# Patient Record
Sex: Female | Born: 1955 | Race: White | Hispanic: No | Smoking: Former smoker
Health system: Southern US, Community
[De-identification: ages and names within clinical notes are randomized; demographics above are authoritative.]

## PROBLEM LIST (undated history)

## (undated) DIAGNOSIS — D649 Anemia, unspecified: Secondary | ICD-10-CM

## (undated) DIAGNOSIS — M1990 Unspecified osteoarthritis, unspecified site: Secondary | ICD-10-CM

## (undated) DIAGNOSIS — K449 Diaphragmatic hernia without obstruction or gangrene: Secondary | ICD-10-CM

## (undated) DIAGNOSIS — M069 Rheumatoid arthritis, unspecified: Secondary | ICD-10-CM

## (undated) DIAGNOSIS — K219 Gastro-esophageal reflux disease without esophagitis: Secondary | ICD-10-CM

## (undated) DIAGNOSIS — I10 Essential (primary) hypertension: Secondary | ICD-10-CM

## (undated) DIAGNOSIS — G4730 Sleep apnea, unspecified: Secondary | ICD-10-CM

## (undated) DIAGNOSIS — E559 Vitamin D deficiency, unspecified: Secondary | ICD-10-CM

## (undated) HISTORY — DX: Anemia, unspecified: D64.9

## (undated) HISTORY — DX: Unspecified osteoarthritis, unspecified site: M19.90

## (undated) HISTORY — DX: Essential (primary) hypertension: I10

## (undated) HISTORY — DX: Diaphragmatic hernia without obstruction or gangrene: K44.9

---

## 2004-08-31 ENCOUNTER — Ambulatory Visit: Payer: Self-pay | Admitting: General Surgery

## 2005-08-28 ENCOUNTER — Ambulatory Visit: Payer: Self-pay | Admitting: General Surgery

## 2005-11-27 HISTORY — PX: COLONOSCOPY: SHX174

## 2006-04-30 ENCOUNTER — Ambulatory Visit: Payer: Self-pay | Admitting: Gastroenterology

## 2006-07-19 ENCOUNTER — Ambulatory Visit: Payer: Self-pay | Admitting: Internal Medicine

## 2006-08-30 ENCOUNTER — Ambulatory Visit: Payer: Self-pay | Admitting: General Surgery

## 2007-01-29 ENCOUNTER — Ambulatory Visit: Payer: Self-pay | Admitting: Internal Medicine

## 2007-09-19 ENCOUNTER — Ambulatory Visit: Payer: Self-pay | Admitting: General Surgery

## 2008-09-21 ENCOUNTER — Ambulatory Visit: Payer: Self-pay | Admitting: Internal Medicine

## 2008-11-27 DIAGNOSIS — K859 Acute pancreatitis without necrosis or infection, unspecified: Secondary | ICD-10-CM

## 2008-11-27 HISTORY — DX: Acute pancreatitis without necrosis or infection, unspecified: K85.90

## 2008-12-18 ENCOUNTER — Inpatient Hospital Stay: Payer: Self-pay | Admitting: Internal Medicine

## 2009-09-23 ENCOUNTER — Ambulatory Visit: Payer: Self-pay | Admitting: Internal Medicine

## 2010-09-26 ENCOUNTER — Ambulatory Visit: Payer: Self-pay | Admitting: Internal Medicine

## 2011-02-07 ENCOUNTER — Ambulatory Visit: Payer: Self-pay | Admitting: Internal Medicine

## 2011-09-28 ENCOUNTER — Ambulatory Visit: Payer: Self-pay | Admitting: Internal Medicine

## 2011-11-28 HISTORY — PX: CARPAL TUNNEL RELEASE: SHX101

## 2012-06-04 ENCOUNTER — Emergency Department: Payer: Self-pay | Admitting: *Deleted

## 2012-06-07 ENCOUNTER — Encounter: Payer: Self-pay | Admitting: Internal Medicine

## 2012-06-27 ENCOUNTER — Encounter: Payer: Self-pay | Admitting: Internal Medicine

## 2012-07-08 ENCOUNTER — Ambulatory Visit: Payer: Self-pay | Admitting: Orthopedic Surgery

## 2012-07-08 DIAGNOSIS — I10 Essential (primary) hypertension: Secondary | ICD-10-CM

## 2012-07-11 ENCOUNTER — Ambulatory Visit: Payer: Self-pay | Admitting: Orthopedic Surgery

## 2012-07-28 ENCOUNTER — Encounter: Payer: Self-pay | Admitting: Internal Medicine

## 2012-09-30 ENCOUNTER — Ambulatory Visit: Payer: Self-pay | Admitting: Internal Medicine

## 2013-06-02 ENCOUNTER — Encounter: Payer: Self-pay | Admitting: Rheumatology

## 2013-06-27 ENCOUNTER — Encounter: Payer: Self-pay | Admitting: Rheumatology

## 2013-10-01 ENCOUNTER — Ambulatory Visit: Payer: Self-pay | Admitting: Internal Medicine

## 2014-01-12 ENCOUNTER — Ambulatory Visit: Payer: Self-pay | Admitting: Internal Medicine

## 2014-11-04 ENCOUNTER — Ambulatory Visit: Payer: Self-pay | Admitting: Internal Medicine

## 2015-03-16 NOTE — Op Note (Signed)
PATIENT NAME:  Janice Rowe, Janice Rowe MR#:  [id number] DATE OF BIRTH:  1956/03/02  DATE OF PROCEDURE:  07/11/2012  PREOPERATIVE DIAGNOSIS: Right carpal tunnel syndrome.   POSTOPERATIVE DIAGNOSIS: Right carpal tunnel syndrome.   PROCEDURE: Right carpal tunnel release.   SURGEON: Laurene Footman, M.D.   ANESTHESIA: General.   DESCRIPTION OF PROCEDURE: The patient was brought to the Operating Room and after adequate general anesthesia was obtained, her arm was prepped and draped in the usual sterile fashion with a tourniquet applied to the upper arm. After patient identification and time-out procedures were carried out, the tourniquet was raised to 250 mmHg. An incision was made along the ulnar crease in line with the ring metacarpal approximately 2.5 cm in length. Subcutaneous tissue was spread and hemostasis achieved with cautery. The transverse carpal ligament was identified and incised. A small hemostat was placed underneath the ligament and release was carried out first distally until there was fat noted around the nerve consistent with lack of compression and then more proximally. There is an area of significant compression about 2 cm in length in the proximal portion of the transverse carpal ligament. After release, the nerve was noted to have a good vascular blush. It did have some slight hourglass constriction in this area. Inspection of the carpal tunnel revealed significant flexor tenosynovitis and a portion of this was debrided. The wound was then closed with 4-0 nylon in a simple interrupted fashion. 10 mL of 0.5% Sensorcaine without epinephrine were infiltrated on either side of the incision. The wound was dressed with Xeroform, 4 x 4's, Webril, and Ace wrap. Tourniquet time was 12 minutes at 250 mmHg.   COMPLICATIONS: None.  SPECIMEN: None.  ESTIMATED BLOOD LOSS: Minimal.  ____________________________ Laurene Footman, MD mjm:ap D: 07/11/2012 21:36:31 ET             T: 07/12/2012 10:34:06 ET               JOB#: [id number] [name] MD ELECTRONICALLY SIGNED 07/12/2012 12:52

## 2015-09-15 DIAGNOSIS — G4733 Obstructive sleep apnea (adult) (pediatric): Secondary | ICD-10-CM | POA: Insufficient documentation

## 2016-01-04 ENCOUNTER — Other Ambulatory Visit: Payer: Self-pay | Admitting: Internal Medicine

## 2016-01-04 DIAGNOSIS — H539 Unspecified visual disturbance: Secondary | ICD-10-CM

## 2016-01-18 ENCOUNTER — Ambulatory Visit: Payer: BC Managed Care – PPO

## 2016-01-18 ENCOUNTER — Ambulatory Visit
Admission: RE | Admit: 2016-01-18 | Discharge: 2016-01-18 | Disposition: A | Discharge status: Home / Self Care | Payer: BC Managed Care – PPO | Source: Ambulatory Visit | Attending: Internal Medicine | Admitting: Internal Medicine

## 2016-01-18 DIAGNOSIS — H539 Unspecified visual disturbance: Secondary | ICD-10-CM | POA: Diagnosis not present

## 2016-01-18 DIAGNOSIS — I6523 Occlusion and stenosis of bilateral carotid arteries: Secondary | ICD-10-CM | POA: Insufficient documentation

## 2016-01-27 DIAGNOSIS — F3342 Major depressive disorder, recurrent, in full remission: Secondary | ICD-10-CM | POA: Insufficient documentation

## 2016-08-02 ENCOUNTER — Other Ambulatory Visit: Payer: Self-pay | Admitting: Internal Medicine

## 2016-08-02 DIAGNOSIS — Z1231 Encounter for screening mammogram for malignant neoplasm of breast: Secondary | ICD-10-CM

## 2016-08-02 DIAGNOSIS — G43109 Migraine with aura, not intractable, without status migrainosus: Secondary | ICD-10-CM | POA: Insufficient documentation

## 2016-08-24 ENCOUNTER — Ambulatory Visit
Admission: RE | Admit: 2016-08-24 | Discharge: 2016-08-24 | Disposition: A | Discharge status: Home / Self Care | Payer: BC Managed Care – PPO | Source: Ambulatory Visit | Attending: Internal Medicine | Admitting: Internal Medicine

## 2016-08-24 DIAGNOSIS — Z1231 Encounter for screening mammogram for malignant neoplasm of breast: Secondary | ICD-10-CM | POA: Diagnosis not present

## 2016-09-01 ENCOUNTER — Other Ambulatory Visit: Payer: Self-pay | Admitting: Gastroenterology

## 2016-09-01 DIAGNOSIS — R1310 Dysphagia, unspecified: Secondary | ICD-10-CM

## 2016-09-12 ENCOUNTER — Ambulatory Visit
Admission: RE | Admit: 2016-09-12 | Discharge: 2016-09-12 | Disposition: A | Discharge status: Home / Self Care | Payer: BC Managed Care – PPO | Source: Ambulatory Visit | Attending: Gastroenterology | Admitting: Gastroenterology

## 2016-09-12 DIAGNOSIS — K449 Diaphragmatic hernia without obstruction or gangrene: Secondary | ICD-10-CM | POA: Diagnosis not present

## 2016-09-12 DIAGNOSIS — K224 Dyskinesia of esophagus: Secondary | ICD-10-CM | POA: Insufficient documentation

## 2016-09-12 DIAGNOSIS — R1310 Dysphagia, unspecified: Secondary | ICD-10-CM | POA: Insufficient documentation

## 2016-09-12 DIAGNOSIS — K222 Esophageal obstruction: Secondary | ICD-10-CM | POA: Insufficient documentation

## 2016-09-12 DIAGNOSIS — K219 Gastro-esophageal reflux disease without esophagitis: Secondary | ICD-10-CM | POA: Insufficient documentation

## 2016-10-03 ENCOUNTER — Encounter: Payer: Self-pay | Admitting: General Surgery

## 2016-10-05 ENCOUNTER — Encounter: Payer: Self-pay | Admitting: *Deleted

## 2016-10-11 ENCOUNTER — Ambulatory Visit (INDEPENDENT_AMBULATORY_CARE_PROVIDER_SITE_OTHER): Payer: BC Managed Care – PPO | Admitting: General Surgery

## 2016-10-11 ENCOUNTER — Encounter: Payer: Self-pay | Admitting: General Surgery

## 2016-10-11 ENCOUNTER — Other Ambulatory Visit: Payer: Self-pay | Admitting: General Surgery

## 2016-10-11 VITALS — BP 128/68 | HR 70 | Resp 12 | Ht 71.0 in | Wt 211.0 lb

## 2016-10-11 DIAGNOSIS — Z1211 Encounter for screening for malignant neoplasm of colon: Secondary | ICD-10-CM

## 2016-10-11 DIAGNOSIS — R4702 Dysphasia: Secondary | ICD-10-CM

## 2016-10-11 DIAGNOSIS — R1310 Dysphagia, unspecified: Secondary | ICD-10-CM

## 2016-10-11 DIAGNOSIS — K625 Hemorrhage of anus and rectum: Secondary | ICD-10-CM

## 2016-10-11 LAB — POC HEMOCCULT BLD/STL (OFFICE/1-CARD/DIAGNOSTIC): FECAL OCCULT BLD: NEGATIVE

## 2016-10-11 NOTE — Progress Notes (Signed)
Patient ID: Janice Rowe, female   DOB: 1956-03-11, 60 y.o.   MRN: [id number]  Chief Complaint  Patient presents with  . Other    HPI Janice Rowe is a 60 y.o. female here today for trouble swallowing. Patient had a barium swallow done on 09/12/2016 which came back abnormal. She states the trouble swallowing has been going on for 6 months. Chicken, rice, red meat and salad can make her choke.  She states she has had some bleeding with bowel movements. Some discomfort during defecation, otherwise symptoms of discomfort during cleansing of the anal tissue.  I personally reviewed the patient's history.    HPI  Past Medical History:  Diagnosis Date  . Anemia   . Arthritis   . Hiatal hernia   . Hypertension     Past Surgical History:  Procedure Laterality Date  . CARPAL TUNNEL RELEASE  2013  . COLONOSCOPY  2007    Family History  Problem Relation Age of Onset  . Breast cancer Mother     Social History Social History  Substance Use Topics  . Smoking status: Never Smoker  . Smokeless tobacco: Never Used  . Alcohol use Yes    Allergies  Allergen Reactions  . Ceftriaxone Nausea Only and Rash    Current Outpatient Prescriptions  Medication Sig Dispense Refill  . B Complex Vitamins (VITAMIN-B COMPLEX) TABS Take by mouth.    . Cholecalciferol 4000 units CAPS Take by mouth.    . etanercept (ENBREL) 50 MG/ML injection INJECT 1 ML (50 MG TOTAL) SUBCUTANEOUSLY ONCE A WEEK.    . folic acid (FOLVITE) 1 MG tablet Take 1 mg by mouth daily.    . metoprolol succinate (TOPROL-XL) 50 MG 24 hr tablet     . Omega-3 Fatty Acids (FISH OIL) 1200 MG CPDR Take by mouth.    Marland Kitchen omeprazole (PRILOSEC) 20 MG capsule Take 20 mg by mouth daily.    Marland Kitchen venlafaxine XR (EFFEXOR-XR) 37.5 MG 24 hr capsule     . vitamin B-12 (CYANOCOBALAMIN) 100 MCG tablet Take 100 mcg by mouth daily.    Marland Kitchen zolpidem (AMBIEN) 5 MG tablet Take 5 mg by mouth at bedtime as needed for sleep.     No current  facility-administered medications for this visit.     Review of Systems Review of Systems  Constitutional: Negative.   HENT: Positive for trouble swallowing.   Respiratory: Positive for choking.   Cardiovascular: Negative.     Blood pressure 128/68, pulse 70, resp. rate 12, height 5\' 11"  (1.803 m), weight 211 lb (95.7 kg).  Physical Exam Physical Exam  Constitutional: She appears well-developed and well-nourished.  Eyes: Conjunctivae are normal. No scleral icterus.  Neck: Neck supple.  Cardiovascular: Normal rate and regular rhythm.   Murmur heard.  Systolic murmur is present with a grade of 1/6  Pulmonary/Chest: Effort normal and breath sounds normal.  Abdominal: Soft. Normal appearance and bowel sounds are normal.  Genitourinary: Rectal exam shows no external hemorrhoid and no internal hemorrhoid.     Lymphadenopathy:    She has no cervical adenopathy.  Neurological: She is alert.  Skin: Skin is dry.    Data Reviewed  09/01/2016 GI evaluation with Laurine Blazer, PA for Dr. Gustavo Lah reviewed.  Daily Prilosec instituted at that visit. Anoscopy showed no significant hemorrhoidal disease. No fissure or fistula noted.  Barium swallow of 09/12/2016 reviewed.  FINDINGS: The patient swallows readily. There is moderate esophageal dysmotility with tertiary contractions involving the distal half of  the esophagus.  There is a fairly small but fixed hiatal hernia with reflux of a full column of barium to the upper esophagus which is slow to empty. There is a small Schatzki ring distally with only slight narrowing at the ring. There is no other focal esophageal narrowing. There is no esophageal mass or ulceration. The pharynx appears normal.  13 mm barium tablet passes freely from the mouth to the level of the Schatzki ring. The tablet hesitate at the Schatzki ring for approximately 30 seconds but after a second bolus of water passed intact through the Schatzki ring and  hiatal hernia into the stomach.  IMPRESSION: Moderate esophageal dysmotility with liquid material. Focal hiatal hernia with a mild Schatzki ring distally. No other narrowing in the esophagus noted. There is diffuse reflux which is slow to empty. 13 mm barium tablet passed freely to the level of the Schatzki ring and pass through the Schatzki ring after a second bolus of water intact into the stomach. There is no demonstrable esophageal mass or ulceration. No mucosal lesions are demonstrated on this study.     Assessment     Dysphagia secondary to distal esophageal dysmotility and Schatzki ring.  Candidate for screening colonoscopy.     Plan    Indication for EGD and biopsy/dilatation reviewed.     Colonoscopy with possible biopsy/polypectomy prn: Information regarding the procedure, including its potential risks and complications (including but not limited to perforation of the bowel, which may require emergency surgery to repair, and bleeding) was verbally given to the patient. Educational information regarding lower intestinal endoscopy was given to the patient. Written instructions for how to complete the bowel prep using Miralax were provided. The importance of drinking ample fluids to avoid dehydration as a result of the prep emphasized.  Patient has been scheduled for an upper and lower endoscopy on 11-29-16 at [facility]. This patient reports she already has Miralax prescription.   This information has been scribed by Gaspar Cola CMA.   Robert Bellow 10/11/2016, 8:04 PM

## 2016-10-11 NOTE — Patient Instructions (Signed)
Colonoscopy, Adult A colonoscopy is an exam to look at the entire large intestine. During the exam, a lubricated, bendable tube is inserted into the anus and then passed into the rectum, colon, and other parts of the large intestine. A colonoscopy is often done as a part of normal colorectal screening or in response to certain symptoms, such as anemia, persistent diarrhea, abdominal pain, and blood in the stool. The exam can help screen for and diagnose medical problems, including:  Tumors.  Polyps.  Inflammation.  Areas of bleeding. Tell a health care provider about:  Any allergies you have.  All medicines you are taking, including vitamins, herbs, eye drops, creams, and over-the-counter medicines.  Any problems you or family members have had with anesthetic medicines.  Any blood disorders you have.  Any surgeries you have had.  Any medical conditions you have.  Any problems you have had passing stool. What are the risks? Generally, this is a safe procedure. However, problems may occur, including:  Bleeding.  A tear in the intestine.  A reaction to medicines given during the exam.  Infection (rare). What happens before the procedure? Eating and drinking restrictions  Follow instructions from your health care provider about eating and drinking, which may include:  A few days before the procedure - follow a low-fiber diet. Avoid nuts, seeds, dried fruit, raw fruits, and vegetables.  1-3 days before the procedure - follow a clear liquid diet. Drink only clear liquids, such as clear broth or bouillon, black coffee or tea, clear juice, clear soft drinks or sports drinks, gelatin desert, and popsicles. Avoid any liquids that contain red or purple dye.  On the day of the procedure - do not eat or drink anything during the 2 hours before the procedure, or within the time period that your health care provider recommends. Bowel prep  If you were prescribed an oral bowel prep to  clean out your colon:  Take it as told by your health care provider. Starting the day before your procedure, you will need to drink a large amount of medicated liquid. The liquid will cause you to have multiple loose stools until your stool is almost clear or light green.  If your skin or anus gets irritated from diarrhea, you may use these to relieve the irritation:  Medicated wipes, such as adult wet wipes with aloe and vitamin E.  A skin soothing-product like petroleum jelly.  If you vomit while drinking the bowel prep, take a break for up to 60 minutes and then begin the bowel prep again. If vomiting continues and you cannot take the bowel prep without vomiting, call your health care provider. General instructions  Ask your health care provider about changing or stopping your regular medicines. This is especially important if you are taking diabetes medicines or blood thinners.  Plan to have someone take you home from the hospital or clinic. What happens during the procedure?  An IV tube may be inserted into one of your veins.  You will be given medicine to help you relax (sedative).  To reduce your risk of infection:  Your health care team will wash or sanitize their hands.  Your anal area will be washed with soap.  You will be asked to lie on your side with your knees bent.  Your health care provider will lubricate a long, thin, flexible tube. The tube will have a camera and a light on the end.  The tube will be inserted into your anus.  The tube will be gently eased through your rectum and colon.  Air will be delivered into your colon to keep it open. You may feel some pressure or cramping.  The camera will be used to take images during the procedure.  A small tissue sample may be removed from your body to be examined under a microscope (biopsy). If any potential problems are found, the tissue will be sent to a lab for testing.  If small polyps are found, your health  care provider may remove them and have them checked for cancer cells.  The tube that was inserted into your anus will be slowly removed. The procedure may vary among health care providers and hospitals. What happens after the procedure?  Your blood pressure, heart rate, breathing rate, and blood oxygen level will be monitored until the medicines you were given have worn off.  Do not drive for 24 hours after the exam.  You may have a small amount of blood in your stool.  You may pass gas and have mild abdominal cramping or bloating due to the air that was used to inflate your colon during the exam.  It is up to you to get the results of your procedure. Ask your health care provider, or the department performing the procedure, when your results will be ready. This information is not intended to replace advice given to you by your health care provider. Make sure you discuss any questions you have with your health care provider. Document Released: 11/10/2000 Document Revised: 06/02/2016 Document Reviewed: 01/25/2016 Elsevier Interactive Patient Education  2017 [employer]  

## 2016-11-08 DIAGNOSIS — R011 Cardiac murmur, unspecified: Secondary | ICD-10-CM | POA: Insufficient documentation

## 2016-11-22 ENCOUNTER — Telehealth: Payer: Self-pay | Admitting: *Deleted

## 2016-11-22 NOTE — Telephone Encounter (Signed)
Patient was contacted today and medication list was reviewed. She states she is no longer on Enbrel and she will begin taking Humira in it's place but has not started on this yet. Also, states that she takes naproxen once a day but unsure of dosage and methotrexate once a week. Medication list was updated accordingly.   We will proceed with upper and lower endoscopy as scheduled for 11-29-16 at [facility].  This patient was instructed to call the office should she have further questions.

## 2016-11-28 ENCOUNTER — Encounter: Payer: Self-pay | Admitting: *Deleted

## 2016-11-29 ENCOUNTER — Ambulatory Visit: Payer: BC Managed Care – PPO | Admitting: Anesthesiology

## 2016-11-29 ENCOUNTER — Ambulatory Visit
Admission: RE | Admit: 2016-11-29 | Discharge: 2016-11-29 | Disposition: A | Discharge status: Home / Self Care | Payer: BC Managed Care – PPO | Source: Ambulatory Visit | Attending: General Surgery | Admitting: General Surgery

## 2016-11-29 ENCOUNTER — Encounter
Admission: RE | Disposition: A | Discharge status: Home / Self Care | Payer: Self-pay | Source: Ambulatory Visit | Attending: General Surgery

## 2016-11-29 DIAGNOSIS — R1310 Dysphagia, unspecified: Secondary | ICD-10-CM | POA: Diagnosis not present

## 2016-11-29 DIAGNOSIS — K222 Esophageal obstruction: Secondary | ICD-10-CM | POA: Diagnosis not present

## 2016-11-29 DIAGNOSIS — I10 Essential (primary) hypertension: Secondary | ICD-10-CM | POA: Diagnosis not present

## 2016-11-29 DIAGNOSIS — K449 Diaphragmatic hernia without obstruction or gangrene: Secondary | ICD-10-CM | POA: Diagnosis not present

## 2016-11-29 DIAGNOSIS — Z1211 Encounter for screening for malignant neoplasm of colon: Secondary | ICD-10-CM | POA: Diagnosis present

## 2016-11-29 DIAGNOSIS — R4702 Dysphasia: Secondary | ICD-10-CM

## 2016-11-29 DIAGNOSIS — D649 Anemia, unspecified: Secondary | ICD-10-CM | POA: Insufficient documentation

## 2016-11-29 DIAGNOSIS — G4730 Sleep apnea, unspecified: Secondary | ICD-10-CM | POA: Insufficient documentation

## 2016-11-29 DIAGNOSIS — K317 Polyp of stomach and duodenum: Secondary | ICD-10-CM | POA: Insufficient documentation

## 2016-11-29 HISTORY — PX: ESOPHAGOGASTRODUODENOSCOPY (EGD) WITH PROPOFOL: SHX5813

## 2016-11-29 HISTORY — DX: Sleep apnea, unspecified: G47.30

## 2016-11-29 HISTORY — PX: COLONOSCOPY WITH PROPOFOL: SHX5780

## 2016-11-29 SURGERY — COLONOSCOPY WITH PROPOFOL
Anesthesia: General

## 2016-11-29 MED ORDER — PROPOFOL 500 MG/50ML IV EMUL
INTRAVENOUS | Status: AC
Start: 2016-11-29 — End: 2016-11-29
  Filled 2016-11-29: qty 50

## 2016-11-29 MED ORDER — SODIUM CHLORIDE 0.9 % IV SOLN
INTRAVENOUS | Status: DC
  Administered 2016-11-29: 07:00:00 via INTRAVENOUS

## 2016-11-29 MED ORDER — PROPOFOL 10 MG/ML IV BOLUS
INTRAVENOUS | Status: DC | PRN
  Administered 2016-11-29: 80 mg via INTRAVENOUS

## 2016-11-29 MED ORDER — FENTANYL CITRATE (PF) 100 MCG/2ML IJ SOLN
INTRAMUSCULAR | Status: DC | PRN
  Administered 2016-11-29: 50 ug via INTRAVENOUS

## 2016-11-29 MED ORDER — PROPOFOL 500 MG/50ML IV EMUL
INTRAVENOUS | Status: DC | PRN
  Administered 2016-11-29: 100 ug/kg/min via INTRAVENOUS

## 2016-11-29 MED ORDER — FENTANYL CITRATE (PF) 100 MCG/2ML IJ SOLN
INTRAMUSCULAR | Status: AC
  Filled 2016-11-29: qty 2

## 2016-11-29 NOTE — Anesthesia Preprocedure Evaluation (Signed)
Anesthesia Evaluation  Patient identified by MRN, date of birth, ID band Patient awake    Reviewed: Allergy & Precautions, H&P , NPO status , Patient's Chart, lab work & pertinent test results  History of Anesthesia Complications Negative for: history of anesthetic complications  Airway Mallampati: III  TM Distance: <3 FB Neck ROM: limited    Dental  (+) Poor Dentition, Chipped   Pulmonary neg shortness of breath, sleep apnea and Continuous Positive Airway Pressure Ventilation ,    Pulmonary exam normal breath sounds clear to auscultation       Cardiovascular Exercise Tolerance: Good hypertension, (-) angina(-) Past MI and (-) DOE Normal cardiovascular exam Rhythm:regular Rate:Normal     Neuro/Psych  Neuromuscular disease negative psych ROS   GI/Hepatic Neg liver ROS, hiatal hernia, GERD  Controlled,  Endo/Other  negative endocrine ROS  Renal/GU negative Renal ROS  negative genitourinary   Musculoskeletal  (+) Arthritis ,   Abdominal   Peds  Hematology negative hematology ROS (+)   Anesthesia Other Findings Past Medical History: No date: Anemia No date: Arthritis No date: Hiatal hernia No date: Hypertension No date: Sleep apnea  Past Surgical History: 2013: CARPAL TUNNEL RELEASE 2007: COLONOSCOPY  BMI    Body Mass Index:  28.45 kg/m      Reproductive/Obstetrics negative OB ROS                             Anesthesia Physical Anesthesia Plan  ASA: III  Anesthesia Plan: General   Post-op Pain Management:    Induction:   Airway Management Planned:   Additional Equipment:   Intra-op Plan:   Post-operative Plan:   Informed Consent: I have reviewed the patients History and Physical, chart, labs and discussed the procedure including the risks, benefits and alternatives for the proposed anesthesia with the patient or authorized representative who has indicated his/her  understanding and acceptance.   Dental Advisory Given  Plan Discussed with: Anesthesiologist, CRNA and Surgeon  Anesthesia Plan Comments:         Anesthesia Quick Evaluation

## 2016-11-29 NOTE — Op Note (Signed)
[facility] Patient Name: Janice Rowe Procedure Date: 11/29/2016 7:32 AM MRN: [id number] Account #: [id number] Date of Birth: 1956/09/27 Admit Type: Outpatient Age: 61 Room: [facility] ENDO ROOM 1 Gender: Female Note Status: Finalized Procedure:            Upper GI endoscopy Indications:          Dysphagia Providers:            Robert Bellow, MD Referring MD:         Ramonita Lab, MD (Referring MD) Medicines:            Monitored Anesthesia Care Complications:        No immediate complications. Procedure:            Pre-Anesthesia Assessment:                       - Prior to the procedure, a History and Physical was                        performed, and patient medications, allergies and                        sensitivities were reviewed. The patient's tolerance of                        previous anesthesia was reviewed.                       - The risks and benefits of the procedure and the                        sedation options and risks were discussed with the                        patient. All questions were answered and informed                        consent was obtained.                       After obtaining informed consent, the endoscope was                        passed under direct vision. Throughout the procedure,                        the patient's blood pressure, pulse, and oxygen                        saturations were monitored continuously. The Endoscope                        was introduced through the mouth, and advanced to the                        second part of duodenum. The upper GI endoscopy was                        accomplished without difficulty. The patient tolerated  the procedure well. Findings:      A mild Schatzki ring (acquired) was found at the gastroesophageal       junction. A TTS dilator was passed through the scope. Dilation with a       15-16.5-18 mm balloon dilator was performed to  18 mm.      A few 4 mm sessile polyps with no bleeding and no stigmata of recent       bleeding were found in the gastric body.      The examined duodenum was normal. Impression:           - Mild Schatzki ring. Dilated.                       - A few gastric polyps.                       - Normal examined duodenum.                       - No specimens collected. Recommendation:       - Perform a colonoscopy today. Procedure Code(s):    --- Professional ---                       [phone], Esophagogastroduodenoscopy, flexible, transoral;                        with transendoscopic balloon dilation of esophagus                        (less than 30 mm diameter) Diagnosis Code(s):    --- Professional ---                       K22.2, Esophageal obstruction                       K31.7, Polyp of stomach and duodenum                       R13.10, Dysphagia, unspecified CPT copyright 2016 American Medical Association. All rights reserved. The codes documented in this report are preliminary and upon coder review may  be revised to meet current compliance requirements. Robert Bellow, MD 11/29/2016 7:54:29 AM This report has been signed electronically. Number of Addenda: 0 Note Initiated On: 11/29/2016 7:32 AM      [facility]

## 2016-11-29 NOTE — Op Note (Signed)
[facility]astroenter[facility] Patient Name: Janice Rowe Procedure Date: 11/29/2016 7:32 AM MRN: [id number] Account #: [id number] Date of Birth: 1956-09-13 Admit Type: Outpatient Age: 61 Room: [facility] ENDO ROOM 1 Gender: Female Note Status: Finalized Procedure:            Colonoscopy Indications:          Screening for colorectal malignant neoplasm Providers:            Robert Bellow, MD Referring MD:         Ramonita Lab, MD (Referring MD) Medicines:            Monitored Anesthesia Care Complications:        No immediate complications. Procedure:            Pre-Anesthesia Assessment:                       - Prior to the procedure, a History and Physical was                        performed, and patient medications, allergies and                        sensitivities were reviewed. The patient's tolerance of                        previous anesthesia was reviewed.                       - The risks and benefits of the procedure and the                        sedation options and risks were discussed with the                        patient. All questions were answered and informed                        consent was obtained.                       After obtaining informed consent, the colonoscope was                        passed under direct vision. Throughout the procedure,                        the patient's blood pressure, pulse, and oxygen                        saturations were monitored continuously. The                        Colonoscope was introduced through the anus and                        advanced to the the cecum, identified by appendiceal                        orifice and ileocecal valve. The colonoscopy was  somewhat difficult due to significant looping.                        Successful completion of the procedure was aided by                        using manual pressure. The patient tolerated the   procedure well. The quality of the bowel preparation                        was good. Findings:      The entire examined colon appeared normal on direct and retroflexion       views. Impression:           - The entire examined colon is normal on direct and                        retroflexion views.                       - No specimens collected. Recommendation:       - Discharge patient to home (via wheelchair).                       - Repeat colonoscopy in 10 years for screening purposes. Procedure Code(s):    --- Professional ---                       [phone], Colonoscopy, flexible; diagnostic, including                        collection of specimen(s) by brushing or washing, when                        performed (separate procedure) Diagnosis Code(s):    --- Professional ---                       Z12.11, Encounter for screening for malignant neoplasm                        of colon CPT copyright 2016 American Medical Association. All rights reserved. The codes documented in this report are preliminary and upon coder review may  be revised to meet current compliance requirements. Robert Bellow, MD 11/29/2016 8:14:19 AM This report has been signed electronically. Number of Addenda: 0 Note Initiated On: 11/29/2016 7:32 AM Scope Withdrawal Time: 0 hours 6 minutes 8 seconds  Total Procedure Duration: 0 hours 16 minutes 28 seconds       [facility]

## 2016-11-29 NOTE — H&P (Signed)
Janice Rowe [id number] 07/04/1956     HPI: Candidate for screening colonoscopy and upper endoscpy/ dilatation. N/V with prep, resolved.    Prescriptions Prior to Admission  Medication Sig Dispense Refill Last Dose  . B Complex Vitamins (VITAMIN-B COMPLEX) TABS Take by mouth.   11/28/2016 at Unknown time  . Cholecalciferol 4000 units CAPS Take by mouth.   11/28/2016 at Unknown time  . folic acid (FOLVITE) 1 MG tablet Take 1 mg by mouth daily.   11/28/2016 at Unknown time  . methotrexate (RHEUMATREX) 2.5 MG tablet Take 2.5 mg by mouth once a week. Caution:Chemotherapy. Protect from light.   11/22/2016 at Unknown time  . Omega-3 Fatty Acids (FISH OIL) 1200 MG CPDR Take by mouth.   11/21/2016 at Unknown time  . omeprazole (PRILOSEC) 20 MG capsule Take 20 mg by mouth daily.   11/28/2016 at Unknown time  . vitamin B-12 (CYANOCOBALAMIN) 100 MCG tablet Take 100 mcg by mouth daily.   11/28/2016 at Unknown time  . zolpidem (AMBIEN) 5 MG tablet Take 5 mg by mouth at bedtime as needed for sleep.   11/28/2016 at Unknown time  . metoprolol succinate (TOPROL-XL) 50 MG 24 hr tablet    Taking  . NAPROXEN PO Take by mouth daily.     Marland Kitchen venlafaxine XR (EFFEXOR-XR) 37.5 MG 24 hr capsule    Taking   Allergies  Allergen Reactions  . Ceftriaxone Nausea Only and Rash   Past Medical History:  Diagnosis Date  . Anemia   . Arthritis   . Hiatal hernia   . Hypertension   . Sleep apnea    Past Surgical History:  Procedure Laterality Date  . CARPAL TUNNEL RELEASE  2013  . COLONOSCOPY  2007   Social History   Social History  . Marital status: Married    Spouse name: N/A  . Number of children: N/A  . Years of education: N/A   Occupational History  . Not on file.   Social History Main Topics  . Smoking status: Never Smoker  . Smokeless tobacco: Never Used  . Alcohol use Yes  . Drug use: No  . Sexual activity: Not on file   Other Topics Concern  . Not on file   Social History Narrative  . No narrative  on file   Social History   Social History Narrative  . No narrative on file     ROS: Negative.     PE: HEENT: Negative. Lungs: Clear. Cardio: RR.  Assessment/Plan:  Proceed with planned endoscopy.   Robert Bellow 11/29/2016

## 2016-11-29 NOTE — Anesthesia Postprocedure Evaluation (Signed)
Anesthesia Post Note  Patient: MACAILA DILORENZO  Procedure(s) Performed: Procedure(s) (LRB): COLONOSCOPY WITH PROPOFOL (N/A) ESOPHAGOGASTRODUODENOSCOPY (EGD) WITH PROPOFOL (N/A)  Patient location during evaluation: Endoscopy Anesthesia Type: General Level of consciousness: awake and alert Pain management: pain level controlled Vital Signs Assessment: post-procedure vital signs reviewed and stable Respiratory status: spontaneous breathing, nonlabored ventilation, respiratory function stable and patient connected to nasal cannula oxygen Cardiovascular status: blood pressure returned to baseline and stable Postop Assessment: no signs of nausea or vomiting Anesthetic complications: no     Last Vitals:  Vitals:   11/29/16 0817 11/29/16 0819  BP: 101/64 101/64  Pulse: 72 71  Resp: 13 18  Temp: 36.7 C     Last Pain:  Vitals:   11/29/16 0817  TempSrc: Axillary                 Precious Haws Piscitello

## 2016-11-29 NOTE — Transfer of Care (Signed)
Immediate Anesthesia Transfer of Care Note  Patient: Janice Rowe  Procedure(s) Performed: Procedure(s): COLONOSCOPY WITH PROPOFOL (N/A) ESOPHAGOGASTRODUODENOSCOPY (EGD) WITH PROPOFOL (N/A)  Patient Location: PACU and Endoscopy Unit  Anesthesia Type:General  Level of Consciousness: awake, alert  and oriented  Airway & Oxygen Therapy: Patient Spontanous Breathing and Patient connected to nasal cannula oxygen  Post-op Assessment: Report given to RN and Post -op Vital signs reviewed and stable  Post vital signs: Reviewed and stable  Last Vitals:  Vitals:   11/29/16 0817 11/29/16 0819  BP: 101/64 101/64  Pulse: 72 71  Resp: 13 18  Temp: 36.7 C     Last Pain:  Vitals:   11/29/16 0817  TempSrc: Axillary         Complications: No apparent anesthesia complications

## 2016-11-30 ENCOUNTER — Encounter: Payer: Self-pay | Admitting: General Surgery

## 2017-02-06 DIAGNOSIS — R311 Benign essential microscopic hematuria: Secondary | ICD-10-CM | POA: Insufficient documentation

## 2017-02-08 ENCOUNTER — Ambulatory Visit: Payer: BC Managed Care – PPO | Attending: Rheumatology | Admitting: Occupational Therapy

## 2017-02-08 ENCOUNTER — Encounter: Payer: Self-pay | Admitting: Occupational Therapy

## 2017-02-08 DIAGNOSIS — M25641 Stiffness of right hand, not elsewhere classified: Secondary | ICD-10-CM | POA: Diagnosis present

## 2017-02-08 DIAGNOSIS — M79641 Pain in right hand: Secondary | ICD-10-CM | POA: Diagnosis not present

## 2017-02-08 NOTE — Patient Instructions (Addendum)
Pt was ed on cont with tendon glides ,opposition  And add Radial deviation of digits  Contrast or paraffin to do - what ever one is needed  Reviewed joint protection - about building up handles or grips  And gripping handles or tools with open fingers or with 90 degrees grip to  Limit ulnar deviation  Order anti ulnar deviation splint for pt

## 2017-02-08 NOTE — Therapy (Signed)
[facility] [address] [place], [address] Phone: [phone]   Fax:  [phone]  Occupational Therapy Evaluation  Patient Details  Name: Janice Rowe MRN: [id number] Date of Birth: [birth year] Referring Provider: Jefm Bryant  Encounter Date: 02/08/2017      OT End of Session - 02/08/17 1750    Visit Number 1   Number of Visits 3   Date for OT Re-Evaluation 03/01/17   OT Start Time 4818   OT Stop Time 1610   OT Time Calculation (min) 55 min   Activity Tolerance Patient tolerated treatment well   Behavior During Therapy [facility] for tasks assessed/performed      Past Medical History:  Diagnosis Date  . Anemia   . Arthritis   . Hiatal hernia   . Hypertension   . Sleep apnea     Past Surgical History:  Procedure Laterality Date  . CARPAL TUNNEL RELEASE  2013  . COLONOSCOPY  2007  . COLONOSCOPY WITH PROPOFOL N/A 11/29/2016   Procedure: COLONOSCOPY WITH PROPOFOL;  Surgeon: Robert Bellow, MD;  Location: [facility] ENDOSCOPY;  Service: Endoscopy;  Laterality: N/A;  . ESOPHAGOGASTRODUODENOSCOPY (EGD) WITH PROPOFOL N/A 11/29/2016   Procedure: ESOPHAGOGASTRODUODENOSCOPY (EGD) WITH PROPOFOL;  Surgeon: Robert Bellow, MD;  Location: [facility] ENDOSCOPY;  Service: Endoscopy;  Laterality: N/A;    There were no vitals filed for this visit.      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, Copy arrangement , teach bible study      Strength   Right Hand Grip (lbs) 42   Right Hand Lateral Pinch 10 lbs   Right Hand 3 Point Pinch 7 lbs   Left Hand Grip (lbs) 36   Left Hand Lateral Pinch 9 lbs   Left Hand 3 Point Pinch 8 lbs     Right Hand AROM   R Index  MCP 0-90 90 Degrees   R Index PIP 0-100 90 Degrees   R Long  MCP 0-90 90 Degrees   R Long PIP 0-100 75 Degrees   R Ring  MCP 0-90 90 Degrees   R Ring PIP 0-100 100 Degrees   R Little  MCP 0-90 90 Degrees   R Little PIP 0-100 100 Degrees       Pt was ed on cont with tendon glides ,opposition  And add Radial deviation of digits  Contrast  or paraffin to do - what ever one is needed  Reviewed joint protection - about building up handles or grips  And gripping handles or tools with open fingers or with 90 degrees grip to  Limit ulnar deviation  Order anti ulnar deviation splint for pt                     OT Education - 02/08/17 1750    Education provided Yes   Education Details findings , plan for HEP , anti ulnar deviation splint ordered , and joint protection    Person(s) Educated Patient   Methods Explanation;Demonstration;Tactile cues;Verbal cues;Handout   Comprehension Verbal cues required;Returned demonstration;Verbalized understanding             OT Long Term Goals - 02/08/17 1755      OT LONG TERM GOAL #1   Title Pt to be ind in donning/doffing  and wearing of anti ulnar deviation splint    Baseline no splint - increase drifting of digits on R hand ulnarly    Time 3   Period Weeks   Status New     OT LONG TERM GOAL #2   Title Pt demo or verbalize 3 joint protection principles to decrease stress on joints in R hand    Time 3    Period Weeks   Status New               Plan - 02/08/17 1751    Clinical Impression Statement Pt present with R hand showing increase ulnar deviation drift - pt do have fusion at 3rd PIP - and with fisting or gripping showing ulnar deviation - appear that 5th extensor tendon under strain for slipping - pt had extreme pain lat week at 5th [facility] and ulnar hand - this week better - reviewed with pt again joint protection for gripping object to avoid ulnar deviation - update and add new exercise to HEP and order anti ulnar deviation splint for her to use at home    Rehab Potential Fair   OT Frequency 1x / week   OT Duration 4 weeks   OT Treatment/Interventions Self-care/ADL training;Splinting;Patient/family education   Plan fit anti ulnar deviation splint - and review joint  protection and AE    OT Home Exercise Plan see pt instruction   Consulted and Agree with Plan of Care Patient      Patient will benefit from skilled therapeutic intervention in order to improve the following deficits and impairments:  Decreased range of motion, Impaired flexibility, Pain, Impaired UE functional use, Decreased knowledge of precautions, Decreased knowledge of use of DME  Visit Diagnosis: Pain in right hand - Plan: Ot plan of care cert/re-cert  Stiffness of right hand, not elsewhere classified - Plan: Ot plan of care cert/re-cert    Problem List Patient Active Problem List   Diagnosis Date Noted  . Dysphagia 10/11/2016  . Encounter for screening colonoscopy 10/11/2016  . Rectal bleeding 10/11/2016    Rosalyn Gess OTR/L,CLT 02/08/2017, 5:59 PM  [facility] [address], [place], [address] Phone: [phone]   Fax:  [phone]  Name: Janice Rowe MRN: [id number] Date of Birth: [birth year]

## 2017-02-19 ENCOUNTER — Ambulatory Visit: Payer: BC Managed Care – PPO | Admitting: Occupational Therapy

## 2017-03-05 ENCOUNTER — Ambulatory Visit: Payer: BC Managed Care – PPO | Attending: Rheumatology | Admitting: Occupational Therapy

## 2017-03-05 DIAGNOSIS — M79641 Pain in right hand: Secondary | ICD-10-CM | POA: Diagnosis present

## 2017-03-05 DIAGNOSIS — M25641 Stiffness of right hand, not elsewhere classified: Secondary | ICD-10-CM

## 2017-03-05 NOTE — Therapy (Signed)
[facility] [address] [place], [address] Phone: [phone]   Fax:  [phone]  Occupational Therapy Treatment/discharge  Patient Details  Name: Janice Rowe MRN: [id number] Date of Birth: 08-06-1956 Referring Provider: Jefm Bryant  Encounter Date: 03/05/2017      OT End of Session - 03/05/17 1512    Visit Number 2   Number of Visits 2   Date for OT Re-Evaluation 03/05/17   OT Start Time 1010   OT Stop Time 1036   OT Time Calculation (min) 26 min   Activity Tolerance Patient tolerated treatment well   Behavior During Therapy [facility] for tasks assessed/performed      Past Medical History:  Diagnosis Date  . Anemia   . Arthritis   . Hiatal hernia   . Hypertension   . Sleep apnea     Past Surgical History:  Procedure Laterality Date  . CARPAL TUNNEL RELEASE  2013  . COLONOSCOPY  2007  . COLONOSCOPY WITH PROPOFOL N/A 11/29/2016   Procedure: COLONOSCOPY WITH PROPOFOL;  Surgeon: Robert Bellow, MD;  Location: [facility] ENDOSCOPY;  Service: Endoscopy;  Laterality: N/A;  . ESOPHAGOGASTRODUODENOSCOPY (EGD) WITH PROPOFOL N/A 11/29/2016   Procedure: ESOPHAGOGASTRODUODENOSCOPY (EGD) WITH PROPOFOL;  Surgeon: Robert Bellow, MD;  Location: [facility] ENDOSCOPY;  Service: Endoscopy;  Laterality: N/A;    There were no vitals filed for this visit.      Subjective Assessment - 03/05/17 1507    Subjective  I did my exercises like you told me - the sliding to the side is hard but gets better - the more I do it - I do try and use larger joint, avoid tight grips - build up handles - and grip objects 90 degrees with  knuckles   Patient Stated Goals I do not want my fingers to get worse or drifting more - and do not want that same pain like I had last week    Currently in Pain? No/denies            [facility] OT Assessment - 03/05/17 0001      Strength   Right Hand Grip (lbs) 42   Right Hand Lateral Pinch 12 lbs   Right  Hand 3 Point Pinch 9 lbs   Left Hand Grip (lbs) 43   Left Hand Lateral Pinch 10 lbs   Left Hand 3 Point Pinch 10 lbs       Reviewed HEP with pt - opposition  RD of digits -sliding on table - pt to add small paper under  3rd digit to increase ROM  Tendon glides 10 reps each  Pt have knowledge and implementing joint protection principles Fitted and ed pt on donning and wearing anti ulnar deviation splint  Start out with 30 min x 3 then 1 min and then 2hrs  Not longer than 6 hrs day - and pull and fasten strap gentle thru webspace  Red areas need to decrease in 30 -45 min after wearing it                      OT Education - 03/05/17 1512    Education provided Yes   Education Details HEP and joint protection principles - and ulnar deviation splint wearing    Person(s) Educated Patient   Methods Explanation;Demonstration;Tactile cues;Verbal cues;Handout   Comprehension Verbal cues required;Returned demonstration;Verbalized understanding             OT Long Term  Goals - 03/05/17 1514      OT LONG TERM GOAL #1   Title Pt to be ind in donning/doffing  and wearing of anti ulnar deviation splint    Status Achieved     OT LONG TERM GOAL #2   Title Pt demo or verbalize 3 joint protection principles to decrease stress on joints in R hand    Status Achieved               Plan - 03/05/17 1513    Clinical Impression Statement Pt made great progress in grip and prehension in L and prehension strength on R hand - pt fitted with ulnar devatiion splint to prevent further ulnar devation at [facility] of R hand - pt demo understanding nad use of joint protection principles - pt met all goals and discharge with homeprogram    OT Treatment/Interventions Self-care/ADL training;Splinting;Patient/family education   Plan discharge with homprogram   OT Home Exercise Plan see pt instruction   Consulted and Agree with Plan of Care Patient      Patient will benefit from skilled  therapeutic intervention in order to improve the following deficits and impairments:     Visit Diagnosis: Pain in right hand  Stiffness of right hand, not elsewhere classified    Problem List Patient Active Problem List   Diagnosis Date Noted  . Dysphagia 10/11/2016  . Encounter for screening colonoscopy 10/11/2016  . Rectal bleeding 10/11/2016    Rosalyn Gess OTR/L,CLT 03/05/2017, 3:20 PM  [facility] [address], [place], [address] Phone: [phone]   Fax:  [phone]  Name: Janice Rowe MRN: [id number] Date of Birth: 04/02/1956

## 2017-03-05 NOTE — Therapy (Deleted)
[facility] [address] [place], [address] Phone: [phone]   Fax:  [phone]  Occupational Therapy Treatment/discharge  Patient Details  Name: Janice Rowe MRN: [id number] Date of Birth: 08-06-1956 Referring Provider: Jefm Bryant  Encounter Date: 03/05/2017      OT End of Session - 03/05/17 1512    Visit Number 2   Number of Visits 2   Date for OT Re-Evaluation 03/05/17   OT Start Time 1010   OT Stop Time 1036   OT Time Calculation (min) 26 min   Activity Tolerance Patient tolerated treatment well   Behavior During Therapy [facility] for tasks assessed/performed      Past Medical History:  Diagnosis Date  . Anemia   . Arthritis   . Hiatal hernia   . Hypertension   . Sleep apnea     Past Surgical History:  Procedure Laterality Date  . CARPAL TUNNEL RELEASE  2013  . COLONOSCOPY  2007  . COLONOSCOPY WITH PROPOFOL N/A 11/29/2016   Procedure: COLONOSCOPY WITH PROPOFOL;  Surgeon: Robert Bellow, MD;  Location: [facility] ENDOSCOPY;  Service: Endoscopy;  Laterality: N/A;  . ESOPHAGOGASTRODUODENOSCOPY (EGD) WITH PROPOFOL N/A 11/29/2016   Procedure: ESOPHAGOGASTRODUODENOSCOPY (EGD) WITH PROPOFOL;  Surgeon: Robert Bellow, MD;  Location: [facility] ENDOSCOPY;  Service: Endoscopy;  Laterality: N/A;    There were no vitals filed for this visit.      Subjective Assessment - 03/05/17 1507    Subjective  I did my exercises like you told me - the sliding to the side is hard but gets better - the more I do it - I do try and use larger joint, avoid tight grips - build up handles - and grip objects 90 degrees with  knuckles   Patient Stated Goals I do not want my fingers to get worse or drifting more - and do not want that same pain like I had last week    Currently in Pain? No/denies            [facility] OT Assessment - 03/05/17 0001      Strength   Right Hand Grip (lbs) 42   Right Hand Lateral Pinch 12 lbs   Right  Hand 3 Point Pinch 9 lbs   Left Hand Grip (lbs) 43   Left Hand Lateral Pinch 10 lbs   Left Hand 3 Point Pinch 10 lbs       Reviewed HEP with pt - opposition  RD of digits -sliding on table - pt to add small paper under  3rd digit to increase ROM  Tendon glides 10 reps each  Pt have knowledge and implementing joint protection principles Fitted and ed pt on donning and wearing anti ulnar deviation splint  Start out with 30 min x 3 then 1 min and then 2hrs  Not longer than 6 hrs day - and pull and fasten strap gentle thru webspace  Red areas need to decrease in 30 -45 min after wearing it                      OT Education - 03/05/17 1512    Education provided Yes   Education Details HEP and joint protection principles - and ulnar deviation splint wearing    Person(s) Educated Patient   Methods Explanation;Demonstration;Tactile cues;Verbal cues;Handout   Comprehension Verbal cues required;Returned demonstration;Verbalized understanding             OT Long Term  Goals - 03/05/17 1514      OT LONG TERM GOAL #1   Title Pt to be ind in donning/doffing  and wearing of anti ulnar deviation splint    Status Achieved     OT LONG TERM GOAL #2   Title Pt demo or verbalize 3 joint protection principles to decrease stress on joints in R hand    Status Achieved               Plan - 03/05/17 1513    OT Treatment/Interventions Self-care/ADL training;Splinting;Patient/family education   Plan discharge with homprogram   OT Home Exercise Plan see pt instruction   Consulted and Agree with Plan of Care Patient      Patient will benefit from skilled therapeutic intervention in order to improve the following deficits and impairments:     Visit Diagnosis: Pain in right hand  Stiffness of right hand, not elsewhere classified    Problem List Patient Active Problem List   Diagnosis Date Noted  . Dysphagia 10/11/2016  . Encounter for screening colonoscopy  10/11/2016  . Rectal bleeding 10/11/2016    Rosalyn Gess OTR/L,CLT 03/05/2017, 3:15 PM  [facility] [address] [place], [address] Phone: [phone]   Fax:  [phone]  Name: Janice Rowe MRN: [id number] Date of Birth: May 02, 1956

## 2017-03-05 NOTE — Patient Instructions (Signed)
Cont with same HEP for ROM   Pt have knowledge and implementing joint protection principles  Fitted and ed pt on donning and wearing anti ulnar deviation splint  Start out with 30 min x 3 then 1 min and then 2hrs  Not longer than 6 hrs day - and pull and fasten strap gentle thru webspace  Red areas need to decrease in 30 -45 min after wearing it

## 2017-07-17 ENCOUNTER — Other Ambulatory Visit: Payer: Self-pay | Admitting: Internal Medicine

## 2017-07-17 DIAGNOSIS — Z1231 Encounter for screening mammogram for malignant neoplasm of breast: Secondary | ICD-10-CM

## 2017-08-15 ENCOUNTER — Other Ambulatory Visit: Payer: Self-pay | Admitting: Internal Medicine

## 2017-08-15 DIAGNOSIS — R918 Other nonspecific abnormal finding of lung field: Secondary | ICD-10-CM

## 2017-08-20 ENCOUNTER — Ambulatory Visit
Admission: RE | Admit: 2017-08-20 | Discharge: 2017-08-20 | Disposition: A | Discharge status: Home / Self Care | Payer: BC Managed Care – PPO | Source: Ambulatory Visit | Attending: Internal Medicine | Admitting: Internal Medicine

## 2017-08-20 ENCOUNTER — Ambulatory Visit
Admission: RE | Admit: 2017-08-20 | Discharge: 2017-08-20 | Disposition: A | Discharge status: Home / Self Care | Payer: BC Managed Care – PPO | Source: Ambulatory Visit | Attending: Otolaryngology | Admitting: Otolaryngology

## 2017-08-20 ENCOUNTER — Other Ambulatory Visit: Payer: Self-pay | Admitting: Otolaryngology

## 2017-08-20 DIAGNOSIS — I700 Atherosclerosis of aorta: Secondary | ICD-10-CM | POA: Insufficient documentation

## 2017-08-20 DIAGNOSIS — R221 Localized swelling, mass and lump, neck: Secondary | ICD-10-CM

## 2017-08-20 DIAGNOSIS — I2510 Atherosclerotic heart disease of native coronary artery without angina pectoris: Secondary | ICD-10-CM | POA: Diagnosis not present

## 2017-08-20 DIAGNOSIS — M4712 Other spondylosis with myelopathy, cervical region: Secondary | ICD-10-CM | POA: Diagnosis not present

## 2017-08-20 DIAGNOSIS — R918 Other nonspecific abnormal finding of lung field: Secondary | ICD-10-CM | POA: Diagnosis present

## 2017-08-20 MED ORDER — IOPAMIDOL (ISOVUE-370) INJECTION 76%
75.0000 mL | Freq: Once | INTRAVENOUS | Status: AC | PRN
Start: 2017-08-20 — End: 2017-08-20
  Administered 2017-08-20: 75 mL via INTRAVENOUS

## 2017-08-21 DIAGNOSIS — R911 Solitary pulmonary nodule: Secondary | ICD-10-CM | POA: Insufficient documentation

## 2017-08-28 ENCOUNTER — Ambulatory Visit
Admission: RE | Admit: 2017-08-28 | Discharge: 2017-08-28 | Disposition: A | Discharge status: Home / Self Care | Payer: BC Managed Care – PPO | Source: Ambulatory Visit | Attending: Internal Medicine | Admitting: Internal Medicine

## 2017-08-28 DIAGNOSIS — Z1231 Encounter for screening mammogram for malignant neoplasm of breast: Secondary | ICD-10-CM | POA: Diagnosis not present

## 2017-08-31 ENCOUNTER — Other Ambulatory Visit: Payer: Self-pay | Admitting: Otolaryngology

## 2017-08-31 DIAGNOSIS — R221 Localized swelling, mass and lump, neck: Secondary | ICD-10-CM

## 2017-09-06 ENCOUNTER — Ambulatory Visit (INDEPENDENT_AMBULATORY_CARE_PROVIDER_SITE_OTHER): Payer: BC Managed Care – PPO | Admitting: Internal Medicine

## 2017-09-06 ENCOUNTER — Encounter: Payer: Self-pay | Admitting: Internal Medicine

## 2017-09-06 DIAGNOSIS — R918 Other nonspecific abnormal finding of lung field: Secondary | ICD-10-CM | POA: Diagnosis not present

## 2017-09-06 NOTE — Patient Instructions (Signed)
--  Continue the levaquin to complete the course as prescribed by Dr. Pryor Ochoa.   --Ct chest in about 6 months and follow up for that.

## 2017-09-06 NOTE — Progress Notes (Addendum)
[facility] Consultation      Assessment and Plan:  61 year old female with persistent symptoms of bronchitis after her recent sinus infection, as well as incidentally found lung nodule. History of rheumatoid arthritis on methotrexate.  Acute bronchitis. -I suspect this was triggered by her recent episode of sinusitis, for which the patient is following up with Dr. Pryor Ochoa. Her symptoms appear to have improved over the last 1 week. -She was provided reassurance today that her symptoms were secondary to sinusitis, and appear to be resolving. We discussed that her symptoms of increased sputum production may persist for several more weeks, but should be trailing off. -She is asked to call us back should her acute bronchitis recur before her scheduled follow-up, so she can be seen early, and any further workup, such as a TB quantiferon or other testing can be performed at that time.  Lung nodule. -Incidentally found small left upper lobe nodule of less than 5 mm. -Repeat CT chest without contrast in 6 months, the patient is asked to follow up after that.  ADDENDUM 09/26/17 Rheumatoid arthritis -Reviewed outpatient records from rheumatology at [facility]. patient is currently seen in [facility] for rheumatoid arthritis, and is being resumed on Humira.  Orders Placed This Encounter  Procedures  . CT CHEST WO CONTRAST    Standing Status:   Future    Standing Expiration Date:   11/06/2018    Order Specific Question:   Preferred imaging location?    Answer:   [facility]    Order Specific Question:   Radiology Contrast Protocol - do NOT remove file path    Answer:   \\[email or web address]   Return in about 6 months (around 03/07/2018).    Date: 09/06/2017  MRN# [id number] Janice Rowe [birth year]    Janice Rowe is a 61 y.o. old female seen in consultation for chief complaint of:    Chief Complaint  Patient presents with  .  Advice Only    per Dr. Pryor Ochoa for cough:  drainage:     HPI:   She has a history of RA; she was having a very sore throat back in September, and was given amox for strep throat. She was also sweating a lot at that time, she takes methtrexate for RA. She continues to have cough and sinuses. She was sent for a CXR, which was abnormal. She went to ENT and had a CT neck which showed swollen lymph glands in her neck. She was already on levaquin, and upon recheck by Dr. Pryor Ochoa it was noted that her lymph glands were still swollen so she has been continued on levaquin for an additional course. She complains of persistent excessive sweating, she denies Tb contacts.   She has cough, particularly in the am, it is now better than it was a week ago. She has been bringing up mucus, she thinks it is now better than it was about a week ago. She takes no inhaler. She has never been diagnosed with respiratory issues.  She last smoked half ppd and stopped >30 yrs ago.  Currently she denies dyspnea.   Images personally reviewed; CT chest 08/20/17-small 5 mm nodule seen in the left upper lobe.   PMHX:   Past Medical History:  Diagnosis Date  . Anemia   . Arthritis   . Hiatal hernia   . Hypertension   . Sleep apnea    Surgical Hx:  Past Surgical History:  Procedure Laterality Date  . CARPAL TUNNEL  RELEASE  2013  . COLONOSCOPY  2007  . COLONOSCOPY WITH PROPOFOL N/A 11/29/2016   Procedure: COLONOSCOPY WITH PROPOFOL;  Surgeon: Robert Bellow, MD;  Location: [facility] ENDOSCOPY;  Service: Endoscopy;  Laterality: N/A;  . ESOPHAGOGASTRODUODENOSCOPY (EGD) WITH PROPOFOL N/A 11/29/2016   Procedure: ESOPHAGOGASTRODUODENOSCOPY (EGD) WITH PROPOFOL;  Surgeon: Robert Bellow, MD;  Location: [facility] ENDOSCOPY;  Service: Endoscopy;  Laterality: N/A;   Family Hx:  Family History  Problem Relation Age of Onset  . Breast cancer Mother 29   Social Hx:   Social History  Substance Use Topics  . Smoking status: Former  Research scientist (life sciences)  . Smokeless tobacco: Never Used  . Alcohol use Yes   Medication:    Current Outpatient Prescriptions:  .  B Complex Vitamins (VITAMIN-B COMPLEX) TABS, Take by mouth., Disp: , Rfl:  .  Cholecalciferol 4000 units CAPS, Take by mouth., Disp: , Rfl:  .  folic acid (FOLVITE) 1 MG tablet, Take 1 mg by mouth daily., Disp: , Rfl:  .  methotrexate (RHEUMATREX) 2.5 MG tablet, Take 2.5 mg by mouth once a week. Caution:Chemotherapy. Protect from light., Disp: , Rfl:  .  metoprolol succinate (TOPROL-XL) 50 MG 24 hr tablet, , Disp: , Rfl:  .  NAPROXEN PO, Take by mouth daily., Disp: , Rfl:  .  Omega-3 Fatty Acids (FISH OIL) 1200 MG CPDR, Take by mouth., Disp: , Rfl:  .  omeprazole (PRILOSEC) 20 MG capsule, Take 20 mg by mouth daily., Disp: , Rfl:  .  venlafaxine XR (EFFEXOR-XR) 75 MG 24 hr capsule, Take 75 mg by mouth daily with breakfast., Disp: , Rfl:  .  vitamin B-12 (CYANOCOBALAMIN) 100 MCG tablet, Take 100 mcg by mouth daily., Disp: , Rfl:  .  zolpidem (AMBIEN) 5 MG tablet, Take 5 mg by mouth at bedtime as needed for sleep., Disp: , Rfl:    Allergies:  Ceftriaxone  Review of Systems: Gen:  Denies  fever, sweats, chills HEENT: Denies blurred vision, double vision. bleeds, sore throat Cvc:  No dizziness, chest pain. Resp:   Denies cough or sputum production, shortness of breath Gi: Denies swallowing difficulty, stomach pain. Gu:  Denies bladder incontinence, burning urine Ext:   No Joint pain, stiffness. Skin: No skin rash,  hives  Endoc:  No polyuria, polydipsia. Psych: No depression, insomnia. Other:  All other systems were reviewed with the patient and were negative other that what is mentioned in the HPI.   Physical Examination:   VS: BP (!) 150/100 (BP Location: Left Arm, Cuff Size: Normal)   Pulse 87   Ht 5\' 11"  (1.803 m)   Wt 211 lb (95.7 kg)   SpO2 95%   BMI 29.43 kg/m   General Appearance: No distress  Neuro:without focal findings,  speech normal,  HEENT: PERRLA,  EOM intact.   Pulmonary: normal breath sounds, No wheezing.  CardiovascularNormal S1,S2.  No m/r/g.   Abdomen: Benign, Soft, non-tender. Renal:  No costovertebral tenderness  GU:  No performed at this time. Endoc: No evident thyromegaly, no signs of acromegaly. Skin:   warm, no rashes, no ecchymosis  Extremities: normal, no cyanosis, clubbing.  Other findings:    LABORATORY PANEL:   CBC No results for input(s): WBC, HGB, HCT, PLT in the last 168 hours. ------------------------------------------------------------------------------------------------------------------  Chemistries  No results for input(s): NA, K, CL, CO2, GLUCOSE, BUN, CREATININE, CALCIUM, MG, AST, ALT, ALKPHOS, BILITOT in the last 168 hours.  Invalid input(s): GFRCGP ------------------------------------------------------------------------------------------------------------------  Cardiac Enzymes No results for input(s): TROPONINI in  the last 168 hours. ------------------------------------------------------------  RADIOLOGY:  No results found.     Thank  you for the consultation and for allowing [facility] to assist in the care of your patient. Our recommendations are noted above.  Please contact us if we can be of further service.   Marda Stalker, MD.  Board Certified in Internal Medicine, Pulmonary Medicine, [place], and Sleep Medicine.  [facility] Office Number: [phone]  Patricia Pesa, M.D.  Merton Border, M.D  09/06/2017

## 2017-09-17 ENCOUNTER — Ambulatory Visit
Admission: RE | Admit: 2017-09-17 | Discharge: 2017-09-17 | Disposition: A | Discharge status: Home / Self Care | Payer: BC Managed Care – PPO | Source: Ambulatory Visit | Attending: Otolaryngology | Admitting: Otolaryngology

## 2017-09-17 DIAGNOSIS — R221 Localized swelling, mass and lump, neck: Secondary | ICD-10-CM | POA: Diagnosis present

## 2018-02-27 ENCOUNTER — Ambulatory Visit
Admission: RE | Admit: 2018-02-27 | Discharge: 2018-02-27 | Disposition: A | Discharge status: Home / Self Care | Payer: BC Managed Care – PPO | Source: Ambulatory Visit | Attending: Internal Medicine | Admitting: Internal Medicine

## 2018-02-27 DIAGNOSIS — R918 Other nonspecific abnormal finding of lung field: Secondary | ICD-10-CM | POA: Diagnosis not present

## 2018-03-06 ENCOUNTER — Ambulatory Visit: Payer: BC Managed Care – PPO | Admitting: Internal Medicine

## 2018-03-18 NOTE — Progress Notes (Signed)
[facility] Consultation      Assessment and Plan:  62 year old female with persistent symptoms of bronchitis after her recent sinus infection, as well as incidentally found lung nodule. History of rheumatoid arthritis on methotrexate.  Chronic bronchitis. -Symptoms of excess mucus production. Discussed that her symptoms can be treated as needed with over the counter mucinex.  --If symptoms progress or are not controlled we could start an anticholinergic inhaler.   Lung nodule. -Incidentally found small left upper lobe nodule of less than 5 mm. -Repeat CT chest in 1 years, follow up after that.   Rheumatoid arthritis -Reviewed outpatient records from rheumatology at [facility]. patient is currently seen in [facility] for rheumatoid arthritis, and is being resumed on Humira.  Orders Placed This Encounter  Procedures  . CT CHEST WO CONTRAST    Standing Status:   Future    Standing Expiration Date:   05/20/2019    Order Specific Question:   Preferred imaging location?    Answer:   [facility]    Order Specific Question:   Radiology Contrast Protocol - do NOT remove file path    Answer:   \\[email or web address]   Return in about 1 year (around 03/20/2019) for after CT chest. .    Date: 03/18/2018  MRN# [id number] Janice Rowe August 24, 1956    Janice Rowe is a 62 y.o. old female seen in consultation for chief complaint of:    Chief Complaint  Patient presents with  . Lung Lesion    f/u  . Cough    daily non productive    HPI:   She has a history of RA; at last visit she was noted to be recovering from a episode of acute bronchitis.  She was also noted to have a lung nodule and was asked to repeated CT of the chest in 6 months.  She continues to have to clear her throat with occasional cough. She thinks she has gerd, but her mother passed away about 2 weeks ago after being in hospice so she is still recovering from  that. She has trouble laying down within a few hours of eating.   Images personally reviewed; repeat CT of the chest on 02/27/18 in comparison with previous CT chest 08/20/17- continued small 5 mm nodule seen in the left upper lobe, no change noted, early emphysematous changes.  Medication:    Current Outpatient Medications:  .  B Complex Vitamins (VITAMIN-B COMPLEX) TABS, Take by mouth., Disp: , Rfl:  .  Cholecalciferol 4000 units CAPS, Take by mouth., Disp: , Rfl:  .  folic acid (FOLVITE) 1 MG tablet, Take 1 mg by mouth daily., Disp: , Rfl:  .  methotrexate (RHEUMATREX) 2.5 MG tablet, Take 2.5 mg by mouth once a week. Caution:Chemotherapy. Protect from light., Disp: , Rfl:  .  metoprolol succinate (TOPROL-XL) 50 MG 24 hr tablet, , Disp: , Rfl:  .  NAPROXEN PO, Take by mouth daily., Disp: , Rfl:  .  Omega-3 Fatty Acids (FISH OIL) 1200 MG CPDR, Take by mouth., Disp: , Rfl:  .  omeprazole (PRILOSEC) 20 MG capsule, Take 20 mg by mouth daily., Disp: , Rfl:  .  venlafaxine XR (EFFEXOR-XR) 75 MG 24 hr capsule, Take 75 mg by mouth daily with breakfast., Disp: , Rfl:  .  vitamin B-12 (CYANOCOBALAMIN) 100 MCG tablet, Take 100 mcg by mouth daily., Disp: , Rfl:  .  zolpidem (AMBIEN) 5 MG tablet, Take 5 mg by mouth at bedtime  as needed for sleep., Disp: , Rfl:    Allergies:  Ceftriaxone  Review of Systems: Gen:  Denies  fever, sweats, chills HEENT: Denies blurred vision, double vision. bleeds, sore throat Cvc:  No dizziness, chest pain. Resp:   Denies cough or sputum production, shortness of breath Gi: Denies swallowing difficulty, stomach pain. Gu:  Denies bladder incontinence, burning urine Ext:   No Joint pain, stiffness. Skin: No skin rash,  hives  Endoc:  No polyuria, polydipsia. Psych: No depression, insomnia. Other:  All other systems were reviewed with the patient and were negative other that what is mentioned in the HPI.   Physical Examination:   VS: BP (!) 160/98 (BP Location: Left  Arm, Cuff Size: Large)   Pulse 74   Resp 16   Ht 5\' 11"  (1.803 m)   Wt 214 lb (97.1 kg)   SpO2 96%   BMI 29.85 kg/m   General Appearance: No distress  Neuro:without focal findings,  speech normal,  HEENT: PERRLA, EOM intact.   Pulmonary: normal breath sounds, No wheezing.  CardiovascularNormal S1,S2.  No m/r/g.   Abdomen: Benign, Soft, non-tender. Renal:  No costovertebral tenderness  GU:  No performed at this time. Endoc: No evident thyromegaly, no signs of acromegaly. Skin:   warm, no rashes, no ecchymosis  Extremities: normal, no cyanosis, clubbing.  Other findings:    LABORATORY PANEL:   CBC No results for input(s): WBC, HGB, HCT, PLT in the last 168 hours. ------------------------------------------------------------------------------------------------------------------  Chemistries  No results for input(s): NA, K, CL, CO2, GLUCOSE, BUN, CREATININE, CALCIUM, MG, AST, ALT, ALKPHOS, BILITOT in the last 168 hours.  Invalid input(s): GFRCGP ------------------------------------------------------------------------------------------------------------------  Cardiac Enzymes No results for input(s): TROPONINI in the last 168 hours. ------------------------------------------------------------  RADIOLOGY:  No results found.     Thank  you for the consultation and for allowing [facility] to assist in the care of your patient. Our recommendations are noted above.  Please contact us if we can be of further service.   Marda Stalker, MD.  Board Certified in Internal Medicine, Pulmonary Medicine, [place], and Sleep Medicine.  [facility] Office Number: [phone]  Patricia Pesa, M.D.  Merton Border, M.D  03/18/2018

## 2018-03-19 ENCOUNTER — Ambulatory Visit: Payer: BC Managed Care – PPO | Admitting: Internal Medicine

## 2018-03-19 ENCOUNTER — Encounter: Payer: Self-pay | Admitting: Internal Medicine

## 2018-03-19 VITALS — BP 160/98 | HR 74 | Resp 16 | Ht 71.0 in | Wt 214.0 lb

## 2018-03-19 DIAGNOSIS — R918 Other nonspecific abnormal finding of lung field: Secondary | ICD-10-CM

## 2018-03-19 DIAGNOSIS — R911 Solitary pulmonary nodule: Secondary | ICD-10-CM | POA: Diagnosis not present

## 2018-03-19 DIAGNOSIS — J42 Unspecified chronic bronchitis: Secondary | ICD-10-CM

## 2018-03-19 NOTE — Patient Instructions (Addendum)
You have symptoms of chronic bronchitis, this can be treated with mucinex over the counter. If becomes more problematic, let us know and we can start you on an inhaler.  Mucinex tablet 600 mg twice daily or 1200 mg once daily.   Will repeat CT chest in 1 year and see you back at that time.

## 2018-03-25 ENCOUNTER — Other Ambulatory Visit: Payer: Self-pay

## 2018-03-25 ENCOUNTER — Emergency Department
Admission: EM | Admit: 2018-03-25 | Discharge: 2018-03-25 | Disposition: A | Discharge status: Home / Self Care | Payer: BC Managed Care – PPO | Attending: Emergency Medicine | Admitting: Emergency Medicine

## 2018-03-25 DIAGNOSIS — Y999 Unspecified external cause status: Secondary | ICD-10-CM | POA: Insufficient documentation

## 2018-03-25 DIAGNOSIS — Z79899 Other long term (current) drug therapy: Secondary | ICD-10-CM | POA: Insufficient documentation

## 2018-03-25 DIAGNOSIS — Y9389 Activity, other specified: Secondary | ICD-10-CM | POA: Insufficient documentation

## 2018-03-25 DIAGNOSIS — S61451A Open bite of right hand, initial encounter: Secondary | ICD-10-CM | POA: Insufficient documentation

## 2018-03-25 DIAGNOSIS — Z87891 Personal history of nicotine dependence: Secondary | ICD-10-CM | POA: Diagnosis not present

## 2018-03-25 DIAGNOSIS — Y929 Unspecified place or not applicable: Secondary | ICD-10-CM | POA: Insufficient documentation

## 2018-03-25 DIAGNOSIS — I10 Essential (primary) hypertension: Secondary | ICD-10-CM | POA: Diagnosis not present

## 2018-03-25 DIAGNOSIS — W540XXA Bitten by dog, initial encounter: Secondary | ICD-10-CM | POA: Diagnosis not present

## 2018-03-25 MED ORDER — AMOXICILLIN-POT CLAVULANATE 875-125 MG PO TABS
1.0000 | ORAL_TABLET | Freq: Once | ORAL | Status: AC
  Administered 2018-03-25: 1 via ORAL
  Filled 2018-03-25: qty 1

## 2018-03-25 MED ORDER — AMOXICILLIN-POT CLAVULANATE 875-125 MG PO TABS: 1.0000 | ORAL_TABLET | Freq: Two times a day (BID) | ORAL | 0 refills | Status: DC

## 2018-03-25 NOTE — ED Provider Notes (Signed)
[facility] Emergency Department Provider Note  ____________________________________________  Time seen: Approximately 10:37 PM  I have reviewed the triage vital signs and the nursing notes.   HISTORY  Chief Complaint Animal Bite    HPI Janice Rowe is a 62 y.o. female presents the emergency department complaining of dog bite to right hand.  Patient reports that she was letting the dog out of the backseat of their vehicle when the leash pinched the dog and he "nicked" her.  Patient reports that he may contact with her hand and has small punctate lesions to the hand.  Patient was concerned she has RA and the risk of infection.  Patient denies any other injury or complaint.  No medications prior to arrival.  She is up-to-date on her tetanus shot.  Patient has previously had rabies vaccination series, but there is no concern at this time and no need for rabies refresher.  No other injury or complaint.  Past Medical History:  Diagnosis Date  . Anemia   . Arthritis   . Hiatal hernia   . Hypertension   . Sleep apnea     Patient Active Problem List   Diagnosis Date Noted  . Dysphagia 10/11/2016  . Encounter for screening colonoscopy 10/11/2016  . Rectal bleeding 10/11/2016    Past Surgical History:  Procedure Laterality Date  . CARPAL TUNNEL RELEASE  2013  . COLONOSCOPY  2007  . COLONOSCOPY WITH PROPOFOL N/A 11/29/2016   Procedure: COLONOSCOPY WITH PROPOFOL;  Surgeon: Robert Bellow, MD;  Location: [facility] ENDOSCOPY;  Service: Endoscopy;  Laterality: N/A;  . ESOPHAGOGASTRODUODENOSCOPY (EGD) WITH PROPOFOL N/A 11/29/2016   Procedure: ESOPHAGOGASTRODUODENOSCOPY (EGD) WITH PROPOFOL;  Surgeon: Robert Bellow, MD;  Location: [facility] ENDOSCOPY;  Service: Endoscopy;  Laterality: N/A;    Prior to Admission medications   Medication Sig Start Date End Date Taking? Authorizing Provider  amoxicillin-clavulanate (AUGMENTIN) 875-125 MG tablet Take 1 tablet by mouth 2  (two) times daily. 03/25/18   Kerney Hopfensperger, Charline Bills, PA-C  B Complex Vitamins (VITAMIN-B COMPLEX) TABS Take by mouth.    [provider]  Cholecalciferol 4000 units CAPS Take by mouth.    [provider]  folic acid (FOLVITE) 1 MG tablet Take 1 mg by mouth daily.    [provider]  HUMIRA 40 MG/0.8ML PSKT Inject 1 each into the skin every 14 (fourteen) days. 03/11/18   [provider]  methotrexate (RHEUMATREX) 2.5 MG tablet Take 2.5 mg by mouth once a week. Caution:Chemotherapy. Protect from light.    [provider]  metoprolol succinate (TOPROL-XL) 50 MG 24 hr tablet  09/23/16   [provider]  NAPROXEN PO Take by mouth daily.    [provider]  Omega-3 Fatty Acids (FISH OIL) 1200 MG CPDR Take by mouth.    [provider]  omeprazole (PRILOSEC) 20 MG capsule Take 20 mg by mouth daily.    [provider]  predniSONE (DELTASONE) 5 MG tablet Take 5 mg by mouth daily. 03/18/18   [provider]  Turmeric 500 MG CAPS Take 500 mg by mouth daily.    [provider]  venlafaxine XR (EFFEXOR-XR) 75 MG 24 hr capsule Take 75 mg by mouth daily with breakfast.    [provider]  vitamin B-12 (CYANOCOBALAMIN) 100 MCG tablet Take 100 mcg by mouth daily.    [provider]  zolpidem (AMBIEN) 5 MG tablet Take 5 mg by mouth at bedtime as needed for sleep.  [provider]    Allergies Ceftriaxone  Family History  Problem Relation Age of Onset  . Breast cancer Mother 99    Social History Social History   Tobacco Use  . Smoking status: Former Research scientist (life sciences)  . Smokeless tobacco: Never Used  Substance Use Topics  . Alcohol use: Yes  . Drug use: No     Review of Systems  Constitutional: No fever/chills Eyes: No visual changes.  Cardiovascular: no chest pain. Respiratory: no cough. No SOB. Gastrointestinal: No abdominal pain.  No nausea, no vomiting.  Musculoskeletal:  Positive for dog bite to the right hand Skin: Negative for rash, abrasions, lacerations, ecchymosis. Neurological: Negative for headaches, focal weakness or numbness. 10-point ROS otherwise negative.  ____________________________________________   PHYSICAL EXAM:  VITAL SIGNS: ED Triage Vitals [03/25/18 2150]  Enc Vitals Group     BP (!) 151/77     Pulse Rate 73     Resp 18     Temp 98.4 F (36.9 C)     Temp src      SpO2 97 %     Weight 214 lb (97.1 kg)     Height      Head Circumference      Peak Flow      Pain Score 4     Pain Loc      Pain Edu?      Excl. in [place]?      Constitutional: Alert and oriented. Well appearing and in no acute distress. Eyes: Conjunctivae are normal. PERRL. EOMI. Head: Atraumatic. ENT:      Ears:       Nose: No congestion/rhinnorhea.      Mouth/Throat: Mucous membranes are moist.  Neck: No stridor.   Cardiovascular: Normal rate, regular rhythm. Normal S1 and S2.  Good peripheral circulation. Respiratory: Normal respiratory effort without tachypnea or retractions. Lungs CTAB. Good air entry to the bases with no decreased or absent breath sounds. Musculoskeletal: Full range of motion to all extremities. No gross deformities appreciated.  5 punctate lesions noted to the right hand.  Hematoma also appreciated.  No lacerations.  No retained foreign body.  No active bleeding.  No erythema or edema.  Full range of motion all 5 digits.  Sensation intact all 5 digits.  Capillary refill intact all 5 digits. Neurologic:  Normal speech and language. No gross focal neurologic deficits are appreciated.  Skin:  Skin is warm, dry and intact. No rash noted. Psychiatric: Mood and affect are normal. Speech and behavior are normal. Patient exhibits appropriate insight and judgement.   ____________________________________________   LABS (all labs ordered are listed, but only abnormal results are displayed)  Labs Reviewed - No data to  display ____________________________________________  EKG   ____________________________________________  RADIOLOGY   No results found.  ____________________________________________    PROCEDURES  Procedure(s) performed:    Procedures    Medications  amoxicillin-clavulanate (AUGMENTIN) 875-125 MG per tablet 1 tablet (1 tablet Oral Given 03/25/18 2245)     ____________________________________________   INITIAL IMPRESSION / ASSESSMENT AND PLAN / ED COURSE  Pertinent labs & imaging results that were available during my care of the patient were reviewed by me and considered in my medical decision making (see chart for details).  Review of the  [place]RS was performed in accordance of the [place] prior to dispensing any controlled drugs.     Patient's diagnosis is consistent with dog bite to the right hand.  Patient presented with puncture wound to the right hand  from accidental dog bite.  No concern for rabies.  Tetanus shot is up-to-date.  Wound was thoroughly cleansed using Betadine and saline.  This was wrapped in the emergency department.  Patient will be started on Augmentin prophylactically.  Patient will follow primary care as needed.  Wound care instructions are provided to patient.  Signs and symptoms of infection are also discussed with patient..  Patient is given ED precautions to return to the ED for any worsening or new symptoms.     ____________________________________________  FINAL CLINICAL IMPRESSION(S) / ED DIAGNOSES  Final diagnoses:  Dog bite of right hand, initial encounter      NEW MEDICATIONS STARTED DURING THIS VISIT:  ED Discharge Orders        Ordered    amoxicillin-clavulanate (AUGMENTIN) 875-125 MG tablet  2 times daily     03/25/18 2253          This chart was dictated using voice recognition software/Dragon. Despite best efforts to proofread, errors can occur which can change the meaning. Any change was purely  unintentional.    Darletta Moll, PA-C 03/25/18 2255    Harvest Dark, MD 03/26/18 0010

## 2018-03-25 NOTE — ED Triage Notes (Signed)
Pt bite by her husbands dog to right hand. Dog is UTD on vaccinations. Pt unknown last tetanus.

## 2018-08-19 ENCOUNTER — Other Ambulatory Visit: Payer: Self-pay | Admitting: Internal Medicine

## 2018-08-19 DIAGNOSIS — Z1231 Encounter for screening mammogram for malignant neoplasm of breast: Secondary | ICD-10-CM

## 2018-08-29 ENCOUNTER — Ambulatory Visit
Admission: RE | Admit: 2018-08-29 | Discharge: 2018-08-29 | Disposition: A | Discharge status: Home / Self Care | Payer: BC Managed Care – PPO | Source: Ambulatory Visit | Attending: Internal Medicine | Admitting: Internal Medicine

## 2018-08-29 DIAGNOSIS — Z1231 Encounter for screening mammogram for malignant neoplasm of breast: Secondary | ICD-10-CM

## 2018-09-23 ENCOUNTER — Other Ambulatory Visit: Payer: Self-pay | Admitting: Internal Medicine

## 2018-09-23 DIAGNOSIS — R911 Solitary pulmonary nodule: Secondary | ICD-10-CM

## 2018-10-01 ENCOUNTER — Ambulatory Visit
Admission: RE | Admit: 2018-10-01 | Discharge: 2018-10-01 | Disposition: A | Discharge status: Home / Self Care | Payer: BC Managed Care – PPO | Source: Ambulatory Visit | Attending: Internal Medicine | Admitting: Internal Medicine

## 2018-10-01 DIAGNOSIS — R911 Solitary pulmonary nodule: Secondary | ICD-10-CM | POA: Insufficient documentation

## 2018-10-04 ENCOUNTER — Ambulatory Visit: Payer: BC Managed Care – PPO | Admitting: Internal Medicine

## 2018-10-04 ENCOUNTER — Encounter: Payer: Self-pay | Admitting: Internal Medicine

## 2018-10-04 VITALS — BP 134/80 | HR 78 | Resp 16 | Ht 71.0 in | Wt 210.0 lb

## 2018-10-04 DIAGNOSIS — J42 Unspecified chronic bronchitis: Secondary | ICD-10-CM | POA: Diagnosis not present

## 2018-10-04 DIAGNOSIS — R911 Solitary pulmonary nodule: Secondary | ICD-10-CM

## 2018-10-04 DIAGNOSIS — D649 Anemia, unspecified: Secondary | ICD-10-CM | POA: Insufficient documentation

## 2018-10-04 DIAGNOSIS — G4733 Obstructive sleep apnea (adult) (pediatric): Secondary | ICD-10-CM

## 2018-10-04 DIAGNOSIS — E559 Vitamin D deficiency, unspecified: Secondary | ICD-10-CM | POA: Insufficient documentation

## 2018-10-04 DIAGNOSIS — I10 Essential (primary) hypertension: Secondary | ICD-10-CM | POA: Insufficient documentation

## 2018-10-04 DIAGNOSIS — M059 Rheumatoid arthritis with rheumatoid factor, unspecified: Secondary | ICD-10-CM | POA: Insufficient documentation

## 2018-10-04 NOTE — Progress Notes (Addendum)
[facility]     Assessment and Plan:  62 year old female with persistent symptoms of bronchitis after her recent sinus infection, as well as incidentally found lung nodule. History of rheumatoid arthritis on methotrexate.  Obstructive sleep apnea. - Increasing daytime sleepiness, she has been on her current device for about 3 years.  Review of download from 1 year ago shows excellent control of obstructive sleep apnea with excellent compliance at that time. - We will attempt to get a download from her machine to see if her sleep apnea is well controlled, if her CPAP is well controlled then I suspect that her daytime fatigue is due to her underlying medical conditions, as well as medications. Addendum: Reviewed most recent download (as below).  Will adjust pressure to 9-12 cm H20.  **CPAP download 08/14/2018-09/12/2018>> usage greater than 4 hours is 30/30 days.  Average usage on days used is 8 hours 9 minutes.  Pressure range 5-20.  Median pressure 7, 93rd percentile 9, maximum pressure 11.  Leaks within normal limits.  Residual AHI is 3.6.  Overall this shows excellent control of obstructive sleep apnea with excellent compliance.  Lung nodule. -Incidentally found small left upper lobe nodule of less than 5 mm on 02/27/2018 repeat CT chest on 10/01/2018 showed stability, however there is another small area of nodularity in the left lower lobe.. -Repeat CT chest in 9 to 12 months.  Reassured patient that I feel that these are low risk, likely related to underlying rheumatoid arthritis.  Rheumatoid arthritis -Reviewed outpatient records from rheumatology at [facility]. patient is currently seen in [place] clinic for rheumatoid arthritis.  Chronic bronchitis. -Currently stable.    Date: 10/04/2018  MRN# [id number] Janice Rowe 06-22-1956    Janice Rowe is a 62 y.o. old female seen in consultation for chief complaint of:    Chief Complaint  Patient presents  with  . Lung Lesion    pt has fatigue and Dr. Caryl Comes ordered another CT and wanted patient seen.    HPI:  She returns today as follow up, she had a repeat CT chest which showed a new LLL nodule. She is also noted to have fatigue during the day, especially during the end of the day. She tends to doze off on most days.  She has OSA and in CPAP. She wears her CPAP 8 hours per night, she takes [place] every night. She gets her supplies via [employer]. Her machine is about 49-26 years old.   **CT chest 10/01/2018>> images personally reviewed, in comparison with previous on 02/27/2018 , stable 5 mm left upper lobe nodule, there is a new hazy  groundglass left lower lobe nodular opacity.  Some mild reactive mediastinal lymphadenopathy. **Download data 08/29/2017- 09/27/2017>> usage greater than 4 hours is 29/30 days.  Average usage on days used is 7 hours 18 minutes.  Pressure ranges 5-20.  Leaks are within normal limits.  Residual AHI is 1.9.  Overall this shows excellent control compliance with CPAP with excellent control of obstructive sleep apnea.  PMHX:   Past Medical History:  Diagnosis Date  . Anemia   . Arthritis   . Hiatal hernia   . Hypertension   . Sleep apnea    Surgical Hx:  Past Surgical History:  Procedure Laterality Date  . CARPAL TUNNEL RELEASE  2013  . COLONOSCOPY  2007  . COLONOSCOPY WITH PROPOFOL N/A 11/29/2016   Procedure: COLONOSCOPY WITH PROPOFOL;  Surgeon: Robert Bellow, MD;  Location: [facility] ENDOSCOPY;  Service: Endoscopy;  Laterality: N/A;  . ESOPHAGOGASTRODUODENOSCOPY (EGD) WITH PROPOFOL N/A 11/29/2016   Procedure: ESOPHAGOGASTRODUODENOSCOPY (EGD) WITH PROPOFOL;  Surgeon: Robert Bellow, MD;  Location: [facility] ENDOSCOPY;  Service: Endoscopy;  Laterality: N/A;   Family Hx:  Family History  Problem Relation Age of Onset  . Breast cancer Mother 10   Social Hx:   Social History   Tobacco Use  . Smoking status: Former Research scientist (life sciences)  . Smokeless tobacco: Never Used  Substance Use  Topics  . Alcohol use: Yes  . Drug use: No   Medication:    Current Outpatient Medications:  .  B Complex Vitamins (VITAMIN-B COMPLEX) TABS, Take by mouth., Disp: , Rfl:  .  Cholecalciferol 4000 units CAPS, Take by mouth., Disp: , Rfl:  .  folic acid (FOLVITE) 1 MG tablet, Take 1 mg by mouth daily., Disp: , Rfl:  .  HUMIRA 40 MG/0.8ML PSKT, Inject 1 each into the skin every 14 (fourteen) days., Disp: , Rfl:  .  methotrexate (RHEUMATREX) 2.5 MG tablet, Take 2.5 mg by mouth once a week. Caution:Chemotherapy. Protect from light., Disp: , Rfl:  .  metoprolol succinate (TOPROL-XL) 50 MG 24 hr tablet, , Disp: , Rfl:  .  NAPROXEN PO, Take by mouth daily., Disp: , Rfl:  .  Omega-3 Fatty Acids (FISH OIL) 1200 MG CPDR, Take by mouth., Disp: , Rfl:  .  omeprazole (PRILOSEC) 20 MG capsule, Take 20 mg by mouth daily., Disp: , Rfl:  .  Turmeric 500 MG CAPS, Take 500 mg by mouth daily., Disp: , Rfl:  .  venlafaxine XR (EFFEXOR-XR) 75 MG 24 hr capsule, Take 75 mg by mouth daily with breakfast., Disp: , Rfl:  .  vitamin B-12 (CYANOCOBALAMIN) 100 MCG tablet, Take 100 mcg by mouth daily., Disp: , Rfl:  .  zolpidem (AMBIEN) 5 MG tablet, Take 5 mg by mouth at bedtime as needed for sleep., Disp: , Rfl:    Allergies:  Ceftriaxone  Review of Systems:  Constitutional: Feels well. Cardiovascular: Denies chest pain, exertional chest pain.  Pulmonary: Denies hemoptysis, pleuritic chest pain.   The remainder of systems were reviewed and were found to be negative other than what is documented in the HPI.      Physical Examination:   VS: BP 134/80 (BP Location: Left Arm, Cuff Size: Large)   Pulse 78   Resp 16   Ht 5\' 11"  (1.803 m)   Wt 210 lb (95.3 kg)   SpO2 94%   BMI 29.29 kg/m   General Appearance: No distress  Neuro:without focal findings,  speech normal,  HEENT: PERRLA, EOM intact.   Pulmonary: normal breath sounds, No wheezing.  CardiovascularNormal S1,S2.  No m/r/g.   Abdomen: Benign, Soft,  non-tender. Renal:  No costovertebral tenderness  GU:  No performed at this time. Endoc: No evident thyromegaly, no signs of acromegaly. Skin:   warm, no rashes, no ecchymosis  Extremities: normal, no cyanosis, clubbing.      LABORATORY PANEL:   CBC No results for input(s): WBC, HGB, HCT, PLT in the last 168 hours. ------------------------------------------------------------------------------------------------------------------  Chemistries  No results for input(s): NA, K, CL, CO2, GLUCOSE, BUN, CREATININE, CALCIUM, MG, AST, ALT, ALKPHOS, BILITOT in the last 168 hours.  Invalid input(s): GFRCGP ------------------------------------------------------------------------------------------------------------------  Cardiac Enzymes No results for input(s): TROPONINI in the last 168 hours. ------------------------------------------------------------  RADIOLOGY:  No results found.     Thank  you for the consultation and for allowing [facility] to assist in  the care of your patient. Our recommendations are noted above.  Please contact us if we can be of further service.   Marda Stalker, M.D., F.C.C.P.  Board Certified in Internal Medicine, Pulmonary Medicine, [place], and Sleep Medicine.  [facility] Office Number: [phone]   10/04/2018

## 2018-10-04 NOTE — Patient Instructions (Addendum)
Will recheck CT chest in 9 months. Will get download from your CPAP machine.

## 2018-10-04 NOTE — Addendum Note (Signed)
Addended by: Stephanie Coup on: 10/04/2018 11:03 AM   Modules accepted: Orders

## 2018-10-10 ENCOUNTER — Ambulatory Visit: Payer: BC Managed Care – PPO | Admitting: Internal Medicine

## 2018-10-11 ENCOUNTER — Ambulatory Visit: Payer: BC Managed Care – PPO | Admitting: Internal Medicine

## 2019-01-13 NOTE — Progress Notes (Signed)
[facility]     Assessment and Plan:  63 year old female with persistent symptoms of bronchitis after her recent sinus infection, as well as incidentally found lung nodule. History of rheumatoid arthritis on methotrexate.  Obstructive sleep apnea. - Increasing daytime sleepiness, she has been on her current device for about 3 years.  Review of download from 1 year ago shows excellent control of obstructive sleep apnea with excellent compliance at that time. - We will attempt to get a download from her machine to see if her sleep apnea is well controlled, if her CPAP is well controlled then I suspect that her daytime fatigue is due to her underlying medical conditions, as well as medications. Addendum: Reviewed most recent download (as below).  Will adjust pressure to 9-12 cm H20.  **CPAP download 08/14/2018-09/12/2018>> usage greater than 4 hours is 30/30 days.  Average usage on days used is 8 hours 9 minutes.  Pressure range 5-20.  Median pressure 7, 93rd percentile 9, maximum pressure 11.  Leaks within normal limits.  Residual AHI is 3.6.  Overall this shows excellent control of obstructive sleep apnea with excellent compliance.  Lung nodule. -Incidentally found small left upper lobe nodule of less than 5 mm on 02/27/2018 repeat CT chest on 10/01/2018 showed stability, however there is another small area of nodularity in the left lower lobe.. -Repeat CT chest in 12 months.  Reassured patient that I feel that these are low risk, likely related to underlying rheumatoid arthritis. If repeat CT chest shows no change can likely stop surveillance.   Rheumatoid arthritis -Reviewed outpatient records from rheumatology at [facility]. patient is currently seen in [place] clinic for rheumatoid arthritis.  Chronic bronchitis. -Currently stable.    Date: 01/13/2019  MRN# [id number] Janice Rowe [birth year]    Janice Rowe is a 63 y.o. old female seen in consultation for  chief complaint of:    Chief Complaint  Patient presents with  . Sleep Apnea    Pt here for cpap compliance visit. She is not having any issues on cpap and is compliant.    HPI:  The patient is a 63 year old female followed due to findings of lung nodule, obstructive sleep apnea.  At last visit CPAP pressure was adjusted to 9-12.  CT chest in April 2019 showed small left upper lobe nodule, recommended repeat CT chest in about a year.  Her CT chest was repeated and November 2019, at that time no significant change was found, and radiologist recommended no further work-up or surveillance. Today she notes that she is doing much better since changing the pressure. She is more rested during the day, and she is using the whole night every night.   **CPAP download 20/19/20-01/13/2019>> uses greater than 4 hours is 30/30 days.  Average usage on days used 8 hours 20 minutes.  Pressure ranges 9-15.  Median pressure 10, 95th percentile pressure 11, maximum pressure 12.5.  Leaks are within normal limits.  Residual AHI is 1.6.  Overall this shows excellent compliance with CPAP with excellent control of obstructive sleep apnea. **CT chest 10/01/2018>> images personally reviewed, in comparison with previous on 02/27/2018 , stable 5 mm left upper lobe nodule, there is a new hazy  groundglass left lower lobe nodular opacity.  Some mild reactive mediastinal lymphadenopathy. **Download data 08/29/2017- 09/27/2017>> usage greater than 4 hours is 29/30 days.  Average usage on days used is 7 hours 18 minutes.  Pressure ranges 5-20.  Leaks are within normal limits.  Residual AHI is 1.9.  Overall this shows excellent control compliance with CPAP with excellent control of obstructive sleep apnea.  PMHX:   Past Medical History:  Diagnosis Date  . Anemia   . Arthritis   . Hiatal hernia   . Hypertension   . Sleep apnea    Medication:    Current Outpatient Medications:  .  B Complex Vitamins (VITAMIN-B COMPLEX) TABS, Take  by mouth., Disp: , Rfl:  .  Cholecalciferol 4000 units CAPS, Take by mouth., Disp: , Rfl:  .  folic acid (FOLVITE) 1 MG tablet, Take 1 mg by mouth daily., Disp: , Rfl:  .  HUMIRA 40 MG/0.8ML PSKT, Inject 1 each into the skin every 14 (fourteen) days., Disp: , Rfl:  .  methotrexate (RHEUMATREX) 2.5 MG tablet, Take 2.5 mg by mouth once a week. Caution:Chemotherapy. Protect from light., Disp: , Rfl:  .  metoprolol succinate (TOPROL-XL) 50 MG 24 hr tablet, , Disp: , Rfl:  .  NAPROXEN PO, Take by mouth daily., Disp: , Rfl:  .  Omega-3 Fatty Acids (FISH OIL) 1200 MG CPDR, Take by mouth., Disp: , Rfl:  .  omeprazole (PRILOSEC) 20 MG capsule, Take 20 mg by mouth daily., Disp: , Rfl:  .  Turmeric 500 MG CAPS, Take 500 mg by mouth daily., Disp: , Rfl:  .  venlafaxine XR (EFFEXOR-XR) 75 MG 24 hr capsule, Take 75 mg by mouth daily with breakfast., Disp: , Rfl:  .  vitamin B-12 (CYANOCOBALAMIN) 100 MCG tablet, Take 100 mcg by mouth daily., Disp: , Rfl:  .  zolpidem (AMBIEN) 5 MG tablet, Take 5 mg by mouth at bedtime as needed for sleep., Disp: , Rfl:    Allergies:  Ceftriaxone   Review of Systems:  Constitutional: Feels well. Cardiovascular: Denies chest pain, exertional chest pain.  Pulmonary: Denies hemoptysis, pleuritic chest pain.   The remainder of systems were reviewed and were found to be negative other than what is documented in the HPI.    Physical Examination:   VS: BP 136/78 (BP Location: Left Arm, Cuff Size: Large)   Pulse 75   Resp 16   Ht 5\' 11"  (1.803 m)   Wt 212 lb (96.2 kg)   SpO2 94%   BMI 29.57 kg/m   General Appearance: No distress  Neuro:without focal findings, mental status, speech normal, alert and oriented HEENT: PERRLA, EOM intact Pulmonary: No wheezing, No rales  CardiovascularNormal S1,S2.  No m/r/g.  Abdomen: Benign, Soft, non-tender, No masses Renal:  No costovertebral tenderness  GU:  No performed at this time. Endoc: No evident thyromegaly, no signs of  acromegaly or Cushing features Skin:   warm, no rashes, no ecchymosis  Extremities: normal, no cyanosis, clubbing.      LABORATORY PANEL:   CBC No results for input(s): WBC, HGB, HCT, PLT in the last 168 hours. ------------------------------------------------------------------------------------------------------------------  Chemistries  No results for input(s): NA, K, CL, CO2, GLUCOSE, BUN, CREATININE, CALCIUM, MG, AST, ALT, ALKPHOS, BILITOT in the last 168 hours.  Invalid input(s): GFRCGP ------------------------------------------------------------------------------------------------------------------  Cardiac Enzymes No results for input(s): TROPONINI in the last 168 hours. ------------------------------------------------------------  RADIOLOGY:  No results found.     Thank  you for the consultation and for allowing [facility] to assist in the care of your patient. Our recommendations are noted above.  Please contact us if we can be of further service.   Marda Stalker, M.D., F.C.C.P.  Board Certified in Internal Medicine, Pulmonary Medicine, [place],  and Sleep Medicine.  [facility] Office Number: [phone]   01/13/2019

## 2019-01-15 ENCOUNTER — Encounter: Payer: Self-pay | Admitting: Internal Medicine

## 2019-01-15 ENCOUNTER — Ambulatory Visit: Payer: BC Managed Care – PPO | Admitting: Internal Medicine

## 2019-01-15 DIAGNOSIS — R918 Other nonspecific abnormal finding of lung field: Secondary | ICD-10-CM

## 2019-01-15 NOTE — Patient Instructions (Signed)
Continue to use cpap every night.  Will recheck CT chest in 1 year.

## 2019-07-28 ENCOUNTER — Other Ambulatory Visit: Payer: Self-pay | Admitting: Internal Medicine

## 2019-07-28 DIAGNOSIS — Z1231 Encounter for screening mammogram for malignant neoplasm of breast: Secondary | ICD-10-CM

## 2019-09-01 ENCOUNTER — Ambulatory Visit
Admission: RE | Admit: 2019-09-01 | Discharge: 2019-09-01 | Disposition: A | Discharge status: Home / Self Care | Payer: BC Managed Care – PPO | Source: Ambulatory Visit | Attending: Internal Medicine | Admitting: Internal Medicine

## 2019-09-01 DIAGNOSIS — Z1231 Encounter for screening mammogram for malignant neoplasm of breast: Secondary | ICD-10-CM

## 2019-09-26 ENCOUNTER — Other Ambulatory Visit: Payer: Self-pay

## 2019-09-26 DIAGNOSIS — R918 Other nonspecific abnormal finding of lung field: Secondary | ICD-10-CM

## 2019-11-03 ENCOUNTER — Ambulatory Visit
Admission: RE | Admit: 2019-11-03 | Discharge: 2019-11-03 | Disposition: A | Discharge status: Home / Self Care | Payer: BC Managed Care – PPO | Source: Ambulatory Visit | Attending: Internal Medicine | Admitting: Internal Medicine

## 2019-11-03 ENCOUNTER — Other Ambulatory Visit: Payer: Self-pay

## 2019-11-03 DIAGNOSIS — R918 Other nonspecific abnormal finding of lung field: Secondary | ICD-10-CM

## 2019-11-13 ENCOUNTER — Ambulatory Visit: Payer: BC Managed Care – PPO | Admitting: Pulmonary Disease

## 2019-11-18 ENCOUNTER — Encounter: Payer: Self-pay | Admitting: Adult Health

## 2019-11-18 ENCOUNTER — Ambulatory Visit (INDEPENDENT_AMBULATORY_CARE_PROVIDER_SITE_OTHER): Payer: BC Managed Care – PPO | Admitting: Adult Health

## 2019-11-18 DIAGNOSIS — R911 Solitary pulmonary nodule: Secondary | ICD-10-CM | POA: Diagnosis not present

## 2019-11-18 DIAGNOSIS — G4733 Obstructive sleep apnea (adult) (pediatric): Secondary | ICD-10-CM | POA: Diagnosis not present

## 2019-11-18 DIAGNOSIS — Z9989 Dependence on other enabling machines and devices: Secondary | ICD-10-CM

## 2019-11-18 NOTE — Progress Notes (Signed)
Virtual Visit via Telephone Note  I connected with Janice Rowe on 11/18/19 at  9:00 AM EST by telephone and verified that I am speaking with the correct person using two identifiers.  Location: Patient: Home  Provider: Office    I discussed the limitations, risks, security and privacy concerns of performing an evaluation and management service by telephone and the availability of in person appointments. I also discussed with the patient that there may be a patient responsible charge related to this service. The patient expressed understanding and agreed to proceed.   History of Present Illness: 63 yo female former smoker followed for lung nodule seen on CT chest and OSA  Medical history significant for rheumatoid arthritis on methotrexate  Today's televisit is a follow up for OSA and Lung nodule. Last seen 12/2018 .  Patient has a known left upper lobe nodule 4 mm seen on CT chest February 27, 2018.  She has had serial CT chest that has shown stability.  Chart review show that nodule was first noted in September 2018 at a 5 mm nodule in the left upper lobe.  Patient had a recent CT chest done November 04, 2019 that showed a stable 4 mm nodule in the left upper lobe.  We discussed her test results.  Patient denies any hemoptysis or unintentional weight loss.  Patient has underlying sleep apnea she is on nocturnal CPAP.  Patient says she is doing very well on CPAP.  She says she never goes without her CPAP.  She feels rested with no significant daytime sleepiness.  Patient does have insomnia which she uses Ambien each night for.  Download shows excellent compliance at 100% usage.  Daily average usage at 8.5 hours.  Patient is on auto CPAP 9 to 15 cm H2O.  AHI 0.9.  Minimal leaks.   Observations/Objective: Speaks in full sentences.  CT chest November 04, 2019 shows no worrisome nodules or acute disease.  4 mm left upper lobe nodule is stable.  Subpleural scarring in the right lower lobe.  Minimal  areas of air trapping.  Assessment and Plan: Left upper lobe lung nodule initially seen on CT chest September 2018 -serial CT chest shows stability.  Recent CT chest on November 04, 2019 shows no change in 4 mm left upper nodule consistent with a benign etiology. No further CT surveillance is indicated at this time.  Obstructive sleep apnea on nocturnal CPAP.-Excellent control and compliance  Plan  Patient Instructions  Lung nodule CT chest shows stable left upper lobe lung nodule  Sleep apnea Continue on CPAP at bedtime Keep up the good work Work on healthy weight Follow-up in 1 year  and as needed         Follow Up Instructions: Follow up in 1 year and As needed     I discussed the assessment and treatment plan with the patient. The patient was provided an opportunity to ask questions and all were answered. The patient agreed with the plan and demonstrated an understanding of the instructions.   The patient was advised to call back or seek an in-person evaluation if the symptoms worsen or if the condition fails to improve as anticipated.  I provided 22  minutes of non-face-to-face time during this encounter.   Rexene Edison, NP

## 2019-11-18 NOTE — Patient Instructions (Addendum)
Lung nodule CT chest shows stable left upper lobe lung nodule  Sleep apnea Continue on CPAP at bedtime Keep up the good work Work on healthy weight Follow-up in 1 year  and as needed

## 2020-03-04 ENCOUNTER — Other Ambulatory Visit: Payer: Self-pay | Admitting: Rheumatology

## 2020-03-04 DIAGNOSIS — M25551 Pain in right hip: Secondary | ICD-10-CM

## 2020-03-11 ENCOUNTER — Ambulatory Visit: Payer: BC Managed Care – PPO

## 2020-05-24 ENCOUNTER — Ambulatory Visit: Payer: BC Managed Care – PPO | Admitting: Dermatology

## 2020-05-24 ENCOUNTER — Other Ambulatory Visit: Payer: Self-pay

## 2020-05-24 DIAGNOSIS — L900 Lichen sclerosus et atrophicus: Secondary | ICD-10-CM | POA: Diagnosis not present

## 2020-05-24 DIAGNOSIS — Z1283 Encounter for screening for malignant neoplasm of skin: Secondary | ICD-10-CM | POA: Diagnosis not present

## 2020-05-24 DIAGNOSIS — L708 Other acne: Secondary | ICD-10-CM

## 2020-05-24 DIAGNOSIS — D229 Melanocytic nevi, unspecified: Secondary | ICD-10-CM

## 2020-05-24 DIAGNOSIS — L820 Inflamed seborrheic keratosis: Secondary | ICD-10-CM | POA: Diagnosis not present

## 2020-05-24 DIAGNOSIS — D1800 Hemangioma unspecified site: Secondary | ICD-10-CM

## 2020-05-24 DIAGNOSIS — L578 Other skin changes due to chronic exposure to nonionizing radiation: Secondary | ICD-10-CM

## 2020-05-24 DIAGNOSIS — L821 Other seborrheic keratosis: Secondary | ICD-10-CM

## 2020-05-24 DIAGNOSIS — L814 Other melanin hyperpigmentation: Secondary | ICD-10-CM

## 2020-05-24 MED ORDER — CLOBETASOL PROPIONATE 0.05 % EX OINT: 1.0000 "application " | TOPICAL_OINTMENT | CUTANEOUS | 1 refills | Status: AC

## 2020-05-24 NOTE — Progress Notes (Signed)
Follow-Up Visit   Subjective  Janice Rowe is a 64 y.o. female who presents for the following: Annual Exam (Total body skin exam), dark spot growing (vaginal area, noticed recently), mole getting larger (L side), rough spot (R inframammary, ~1U), and Lichen Sclerosis (groin, clobetasol).  Treated by Dr. Leafy Ro.  Spot under breast gets irritated by clothing.   The following portions of the chart were reviewed this encounter and updated as appropriate:      Review of Systems:  No other skin or systemic complaints except as noted in HPI or Assessment and Plan.  Objective  Well appearing patient in no apparent distress; mood and affect are within normal limits.  A full examination was performed including scalp, head, eyes, ears, nose, lips, neck, chest, axillae, abdomen, back, buttocks, bilateral upper extremities, bilateral lower extremities, hands, feet, fingers, toes, fingernails, and toenails. All findings within normal limits unless otherwise noted below.  Objective  perianal, labia: Mild erythema perirectal/inferior introitus, pt reports having symptoms in this area Labia majora/minora appear clear today  Objective  Right upper abdomen x 1: Erythematous keratotic or waxy stuck-on papule  Objective  L inframmary: Dilated pore  Objective  R labia majora: Stuck-on, waxy, tan-brown papule -- Discussed benign etiology and prognosis.    Assessment & Plan   Lentigines - Scattered tan macules - Discussed due to sun exposure - Benign, observe - Call for any changes  Seborrheic Keratoses - Stuck-on, waxy, tan-brown papules and plaques, including L flank  - Discussed benign etiology and prognosis. - Observe - Call for any changes  Melanocytic Nevi - Tan-brown and/or pink-flesh-colored symmetric macules and papules - Benign appearing on exam today - Observation - Call clinic for new or changing moles - Recommend daily use of broad spectrum spf 30+ sunscreen to  sun-exposed areas.   Hemangiomas - Red papules - Discussed benign nature - Observe - Call for any changes  Actinic Damage - diffuse scaly erythematous macules with underlying dyspigmentation - Recommend daily broad spectrum sunscreen SPF 30+ to sun-exposed areas, reapply every 2 hours as needed.  - Call for new or changing lesions.  Skin cancer screening performed today.   Lichen sclerosus et atrophicus perianal, labia  Cont Clobetasol oint qhs prn symptoms/flares, may try to decrease to 2-3x/wk once symptoms improved to maintain.  Start Aquaphor or Desitin original cr qAM to perianal area as a skin protectant to minimize friction, since symptoms seem to worsen after working outside and sweating.  Topical steroids (such as triamcinolone, fluocinolone, fluocinonide, mometasone, clobetasol, halobetasol, betamethasone, hydrocortisone) can cause thinning and lightening of the skin if they are used for too long in the same area. Your physician has selected the right strength medicine for your problem and area affected on the body. Please use your medication only as directed by your physician to prevent side effects.    clobetasol ointment (TEMOVATE) 0.05 % - perianal, labia  Inflamed seborrheic keratosis Right upper abdomen x 1  Destruction of lesion - Right upper abdomen x 1  Destruction method: cryotherapy   Informed consent: discussed and consent obtained   Lesion destroyed using liquid nitrogen: Yes   Region frozen until ice ball extended beyond lesion: Yes   Outcome: patient tolerated procedure well with no complications   Post-procedure details: wound care instructions given    Other acne L inframmary  Dilated pore  Benign, discussed excision to remove if bothersome  Seborrheic keratosis R labia majora  Reassured benign age-related growth.  Recommend observation.  Discussed cryotherapy if spot(s) become irritated or inflamed.  Pt defers at this time.  Return in  about 1 year (around 05/24/2021) for TBSE.   I, Othelia Pulling, RMA, am acting as scribe for Brendolyn Patty, MD .  Documentation: I have reviewed the above documentation for accuracy and completeness, and I agree with the above.  Brendolyn Patty MD

## 2020-05-24 NOTE — Patient Instructions (Signed)

## 2020-08-09 ENCOUNTER — Other Ambulatory Visit: Payer: Self-pay | Admitting: Internal Medicine

## 2020-08-09 DIAGNOSIS — Z1231 Encounter for screening mammogram for malignant neoplasm of breast: Secondary | ICD-10-CM

## 2020-08-12 ENCOUNTER — Other Ambulatory Visit: Payer: Self-pay | Admitting: Family Medicine

## 2020-08-12 ENCOUNTER — Other Ambulatory Visit: Payer: Self-pay

## 2020-08-12 ENCOUNTER — Ambulatory Visit
Admission: RE | Admit: 2020-08-12 | Discharge: 2020-08-12 | Disposition: A | Discharge status: Home / Self Care | Payer: BC Managed Care – PPO | Source: Ambulatory Visit | Attending: Family Medicine | Admitting: Family Medicine

## 2020-08-12 DIAGNOSIS — R1032 Left lower quadrant pain: Secondary | ICD-10-CM | POA: Insufficient documentation

## 2020-08-12 DIAGNOSIS — D72829 Elevated white blood cell count, unspecified: Secondary | ICD-10-CM | POA: Diagnosis present

## 2020-08-12 MED ORDER — IOHEXOL 300 MG/ML  SOLN
100.0000 mL | Freq: Once | INTRAMUSCULAR | Status: AC | PRN
  Administered 2020-08-12: 100 mL via INTRAVENOUS

## 2020-09-01 ENCOUNTER — Other Ambulatory Visit: Payer: Self-pay

## 2020-09-01 ENCOUNTER — Ambulatory Visit
Admission: RE | Admit: 2020-09-01 | Discharge: 2020-09-01 | Disposition: A | Discharge status: Home / Self Care | Payer: BC Managed Care – PPO | Source: Ambulatory Visit | Attending: Internal Medicine | Admitting: Internal Medicine

## 2020-09-01 DIAGNOSIS — Z1231 Encounter for screening mammogram for malignant neoplasm of breast: Secondary | ICD-10-CM | POA: Insufficient documentation

## 2020-09-03 ENCOUNTER — Other Ambulatory Visit: Payer: Self-pay | Admitting: Obstetrics and Gynecology

## 2020-09-03 DIAGNOSIS — R1900 Intra-abdominal and pelvic swelling, mass and lump, unspecified site: Secondary | ICD-10-CM

## 2020-09-07 ENCOUNTER — Other Ambulatory Visit: Payer: Self-pay

## 2020-09-07 ENCOUNTER — Encounter: Payer: Self-pay | Admitting: Physical Therapy

## 2020-09-07 ENCOUNTER — Ambulatory Visit: Payer: BC Managed Care – PPO | Attending: Obstetrics and Gynecology | Admitting: Physical Therapy

## 2020-09-07 DIAGNOSIS — M25551 Pain in right hip: Secondary | ICD-10-CM | POA: Diagnosis present

## 2020-09-07 DIAGNOSIS — M6281 Muscle weakness (generalized): Secondary | ICD-10-CM | POA: Insufficient documentation

## 2020-09-07 DIAGNOSIS — R262 Difficulty in walking, not elsewhere classified: Secondary | ICD-10-CM | POA: Diagnosis present

## 2020-09-07 NOTE — Therapy (Signed)
[facility] [address] [place], [place], [address] Phone: [phone]   Fax:  [phone]  Physical Therapy Evaluation  Patient Details  Name: Janice Rowe MRN: [id number] Date of Birth: [birth year] Referring Provider (PT): Denver Faster   Encounter Date: 09/07/2020   PT End of Session - 09/07/20 1050    Visit Number 1    Number of Visits 8    Date for PT Re-Evaluation 11/02/20    Authorization Type IE 09/07/2020    PT Start Time 1055    PT Stop Time 1150    PT Time Calculation (min) 55 min    Activity Tolerance Patient tolerated treatment well    Behavior During Therapy [facility] for tasks assessed/performed           Past Medical History:  Diagnosis Date   Anemia    Arthritis    Hiatal hernia    Hypertension    Sleep apnea     Past Surgical History:  Procedure Laterality Date   CARPAL TUNNEL RELEASE  2013   COLONOSCOPY  2007   COLONOSCOPY WITH PROPOFOL N/A 11/29/2016   Procedure: COLONOSCOPY WITH PROPOFOL;  Surgeon: Robert Bellow, MD;  Location: [facility] ENDOSCOPY;  Service: Endoscopy;  Laterality: N/A;   ESOPHAGOGASTRODUODENOSCOPY (EGD) WITH PROPOFOL N/A 11/29/2016   Procedure: ESOPHAGOGASTRODUODENOSCOPY (EGD) WITH PROPOFOL;  Surgeon: Robert Bellow, MD;  Location: [facility] ENDOSCOPY;  Service: Endoscopy;  Laterality: N/A;    There were no vitals filed for this visit.        [facility] PT Assessment - 09/07/20 0001      Assessment   Medical Diagnosis R hip pain    Referring Provider (PT) Leafy Ro, B    Hand Dominance Right    Prior Therapy None for this dx      Balance Screen   Has the patient fallen in the past 6 months No            PELVIC HEALTH PHYSICAL THERAPY EVALUATION  SCREENING Red Flags: None Have you had any night sweats? Unexplained weight loss? Saddle anesthesia? Unexplained changes in bowel or bladder habits?   SUBJECTIVE  Chief Complaint: Patient states that she had an E. Coli  infection recently and on CT there was a noticeable thickening in the RLQ. Patient notes that she has had pain in RLQ for > 6 months. Patient reports that all imaging showed R hip arthritis. Patient had an injection in the R hip which was helpful. Patient did recently incur an injury to the R anterior thigh which sparked some sciatic nerve type pain. Patient notes that standing is one of the hardest things to do. Patient states that she has been slowed down by her R hip in the past 2 months. Patient is unable to do yard work and participate fully in church activities.   Pertinent History:  Falls Negative.  Scoliosis Negative. Pulmonary disease/dysfunction Negative. Surgical history: Negative.   Obstetrical History: G1P1 Deliveries: vaginal Tearing/Episiotomy: episiotomy and tear Birthing position: back  Gynecological History: Hysterectomy: No  Pain with exam: No   Urinary History: Incontinence: Negative; positive for Urinary Urgency Triggers: seeing a bathroom  Nocturia: 0-1x/night Frequency of urination: every 2-3+ hours Pain with urination: Negative Difficulty initiating urination: Negative Frequent UTI: Negative.   Gastrointestinal History: [place] Stool Chart: Type 4 Frequency of BMs: 1x/day Pain with defecation: Negative Straining with defecation: Negative Incontinence: Negative.  Sexual activity/pain: Pain with intercourse: Positive for irritation with lichens sclerosus.   Location  of pain: RLQ Current pain:  2/10  Max pain:  5/10 Least pain:  2/10 Pain quality: pain quality: aching and dull Radiating pain: No  Location of pain: R hip  Current pain:  2-3/10  Max pain:  9/10 Least pain:  2-3/10 Pain quality: pain quality: jabbing Radiating pain: Yes (down the posterior thigh with a constricting pain)  Patient assessment of present state: Because of the accident with the machine, my sciatic nerve is irritated. This (pointing to RLQ) I have no idea.  Current  activities:  Church, gardening/yard work, Marine scientist  Patient Goals:  Be able to better manage pain and decrease pain   OBJECTIVE  Mental Status Patient is oriented to person, place and time.  Recent memory is intact.  Remote memory is intact.  Attention span and concentration are intact.  Expressive speech is intact.  Patient's fund of knowledge is within normal limits for educational level.  POSTURE/OBSERVATIONS:  Lumbar lordosis: WNL Iliac crest height: R slightly lower than L Pelvic obliquity: mildly rotated to the R Leg length discrepancy: L 91.5 cm, R 90 cm  GAIT: Decreased stride length and limited R knee extension. Trendelenburg R: Positive L: Negative  RANGE OF MOTION:    LEFT RIGHT  Lumbar forward flexion (65):  WFL    Lumbar extension (30): WFL (R pain)    Lumbar lateral flexion (25):  [facility] WFL (R pain)  Thoracic and Lumbar rotation (30 degrees):    [facility] WFL  Hip Flexion (0-125):   WFL 100 degrees (guarded)  Hip IR (0-45):  5 degrees 0 degrees (guarded)  Hip ER (0-45):  30 degrees 20 degrees painful  Hip Abduction (0-40):  WFL 20 degrees (painful)  Hip extension (0-15):  [facility] WFL    SENSATION: Grossly intact to light touch bilateral LEs as determined by testing dermatomes L2-S2 Proprioception and hot/cold testing deferred on this date  STRENGTH: MMT   RLE LLE  Hip Flexion 5 5  Hip Extension 5 5  Hip Abduction  3+ 5  Hip Adduction  5 5  Hip ER  3 5  Hip IR  4 5  Knee Extension 5 5  Knee Flexion 5 5  Dorsiflexion  5 5  Plantarflexion (seated) 5 5   ABDOMINAL:  Palpation: TTP over RLQ; increased rectus tension throughout. 2/10 TTP of L psoas Diastasis: none distinctly present, but apparent rectus dominance Rib flare: none noted  SPECIAL TESTS: Centralization and Peripheralization (SN 92, -LR 0.12): Negative SLR (SN 92, -LR 0.29): R: Positive. L:  Negative Lumbar quadrant (SN 70): R: Positive L: Negative FABER (SN 81): R: Positive L:  Negative FADIR (SN 94): R: Negative L: Negative Stork/March (SP 93): R: Negative L: Negative  PHYSICAL PERFORMANCE MEASURES: STS: WNL  EXTERNAL PELVIC EXAM: deferred 2/2 to time constraints Palpation: Breath coordination: Cued Lengthen: Cued Contraction: Cough:    OUTCOME MEASURES: FOTO 47   ASSESSMENT Patient is a 64 year old presenting to clinic with chief complaints of R hip and RLQ pain. Upon examination, patient demonstrates deficits in R hip mobility, IAP management, RLE strength, pain, gait, and posture as evidenced by L IC elevation, RLE leg length discrepancy (1.5 cm), 9/10 R hip pain, 5/10 RLQ pain, limited R hip flexion (100 degrees) and external rotation (20 degrees), breath holding and rectus abdominis overactivity with positional changes, limited stride length on RLE during gait. Patient's responses on FOTO outcome measures (47) indicate significant functional limitations/disability/distress. Patient's progress may be limited due to chronicity and complexity of  complaints; however, patient's motivation is advantageous. Patient was able to achieve basic understanding of postural corrective measures including heel lift during today's evaluation and responded positively to educational interventions. Patient will benefit from continued skilled therapeutic intervention to address deficits in R hip mobility, IAP management, RLE strength, pain, gait, and posture in order to increase function and improve overall QOL.  EDUCATION Patient educated on prognosis, POC, and provided with HEP including: heel lift in R shoe. Patient articulated understanding and returned demonstration. Patient will benefit from further education in order to maximize compliance and understanding for long-term therapeutic gains.  TREATMENT Manual Therapy: R hip LAD for decreased pain and improved mobility, gentle oscillations  Neuromuscular Re-education: Patient educated on primary functions of the pelvic  floor including: posture/balance, sexual pleasure, storage and elimination of waste from the body, abdominal cavity closure, and breath coordination.   Objective measurements completed on examination: See above findings.      PT Long Term Goals - 09/07/20 1419      PT LONG TERM GOAL #1   Title Patient will demonstrate independence with HEP in order to maximize therapeutic gains and improve carryover from physical therapy sessions to ADLs in the home and community.    Baseline IE: not initiated    Time 8    Period Weeks    Status New    Target Date 11/02/20      PT LONG TERM GOAL #2   Title Patient will decrease worst R hip pain as reported on NPRS by at least 2 points to demonstrate clinically significant reduction in pain in order to restore/improve function and overall QOL.    Baseline IE: 9/10    Time 8    Period Weeks    Status New    Target Date 11/02/20      PT LONG TERM GOAL #3   Title Patient will demonstrate improved pain modulation and IAP management as evidenced by ability to practice diaphragmatic breathing with a 1:2 inhalation:exhalation ratio.    Baseline IE: not demonstrated (significant breath holding with guarding)    Time 8    Period Weeks    Status New    Target Date 11/02/20      PT LONG TERM GOAL #4   Title Patient will demonstrate improved function as evidenced by a score of 63 on FOTO measure for full participation in activities at home and in the community.    Baseline IE: 32    Time 8    Period Weeks    Status New    Target Date 11/02/20      PT LONG TERM GOAL #5   Title Patient will demonstrate improved R hip mobility as evidenced by R hip flexion > 115 degrees absent of pain for improved function and mobility.    Baseline IE: 100 degrees and painful    Time 8    Period Weeks    Status New    Target Date 11/02/20                  Plan - 09/07/20 1051    Clinical Impression Statement Patient is a 64 year old presenting to clinic  with chief complaints of R hip and RLQ pain. Upon examination, patient demonstrates deficits in R hip mobility, IAP management, RLE strength, pain, gait, and posture as evidenced by L IC elevation, RLE leg length discrepancy (1.5 cm), 9/10 R hip pain, 5/10 RLQ pain, limited R hip flexion (100 degrees) and external rotation (20  degrees), breath holding and rectus abdominis overactivity with positional changes, limited stride length on RLE during gait. Patient's responses on FOTO outcome measures (47) indicate significant functional limitations/disability/distress. Patient's progress may be limited due to chronicity and complexity of complaints; however, patient's motivation is advantageous. Patient was able to achieve basic understanding of postural corrective measures including heel lift during today's evaluation and responded positively to educational interventions. Patient will benefit from continued skilled therapeutic intervention to address deficits in R hip mobility, IAP management, RLE strength, pain, gait, and posture in order to increase function and improve overall QOL.    Personal Factors and Comorbidities Age;Sex;Comorbidity 3+;Time since onset of injury/illness/exacerbation;Past/Current Experience    Comorbidities RA, anemia, HTN, hiatal hernia, sleep apnea    Examination-Activity Limitations Squat;Stairs;Lift;Sit;Locomotion Level;Stand    Examination-Participation Restrictions Church;Yard Work;Meal Prep    Stability/Clinical Decision Making Evolving/Moderate complexity    Clinical Decision Making Moderate    Rehab Potential Fair    PT Frequency 1x / week    PT Duration 8 weeks    PT Treatment/Interventions Cryotherapy;Electrical Stimulation;Moist Heat;Therapeutic activities;Therapeutic exercise;Neuromuscular re-education;Manual techniques;Patient/family education;Joint Manipulations;Spinal Manipulations;Dry needling;Passive range of motion;Taping;ADLs/Self Care Home Management    PT Next  Visit Plan Deep core phase 1; manual as needed    PT Home Exercise Plan R heel lift    Consulted and Agree with Plan of Care Patient           Patient will benefit from skilled therapeutic intervention in order to improve the following deficits and impairments:  Abnormal gait, Decreased balance, Difficulty walking, Pain, Postural dysfunction, Improper body mechanics, Decreased activity tolerance, Decreased strength, Decreased range of motion, Hypomobility, Decreased mobility  Visit Diagnosis: Pain in right hip  Difficulty in walking, not elsewhere classified  Muscle weakness (generalized)     Problem List Patient Active Problem List   Diagnosis Date Noted   Anemia 10/04/2018   Essential hypertension 10/04/2018   Rheumatoid arthritis with rheumatoid factor ([place]) 10/04/2018   Vitamin D deficiency 10/04/2018   Lung nodule seen on imaging study 08/21/2017   Benign microscopic hematuria 02/06/2017   Cardiac murmur 11/08/2016   Dysphagia 10/11/2016   Encounter for screening colonoscopy 10/11/2016   Rectal bleeding 10/11/2016   Ocular migraine 08/02/2016   Recurrent major depressive disorder, in full remission ([place]) 01/27/2016   Obstructive sleep apnea 09/15/2015   Myles Gip PT, DPT [phone]  09/07/2020, 2:21 PM   [facility] [address] [place], [place], [address] Phone: [phone]   Fax:  [phone]  Name: Janice Rowe MRN: [id number] Date of Birth: Nov 10, 1956

## 2020-09-10 ENCOUNTER — Other Ambulatory Visit: Payer: Self-pay

## 2020-09-10 ENCOUNTER — Ambulatory Visit
Admission: RE | Admit: 2020-09-10 | Discharge: 2020-09-10 | Disposition: A | Discharge status: Home / Self Care | Payer: BC Managed Care – PPO | Source: Ambulatory Visit | Attending: Obstetrics and Gynecology | Admitting: Obstetrics and Gynecology

## 2020-09-10 DIAGNOSIS — R1900 Intra-abdominal and pelvic swelling, mass and lump, unspecified site: Secondary | ICD-10-CM | POA: Diagnosis not present

## 2020-09-16 ENCOUNTER — Ambulatory Visit: Payer: BC Managed Care – PPO | Admitting: Physical Therapy

## 2020-09-16 ENCOUNTER — Other Ambulatory Visit: Payer: Self-pay

## 2020-09-16 ENCOUNTER — Encounter: Payer: Self-pay | Admitting: Physical Therapy

## 2020-09-16 DIAGNOSIS — M25551 Pain in right hip: Secondary | ICD-10-CM

## 2020-09-16 DIAGNOSIS — R262 Difficulty in walking, not elsewhere classified: Secondary | ICD-10-CM

## 2020-09-16 DIAGNOSIS — M6281 Muscle weakness (generalized): Secondary | ICD-10-CM

## 2020-09-16 NOTE — Therapy (Signed)
[facility] [address] [place], [place], [address] Phone: [phone]   Fax:  [phone]  Physical Therapy Treatment  Patient Details  Name: Janice Rowe MRN: [id number] Date of Birth: 12-30-1955 Referring Provider (PT): Denver Faster   Encounter Date: 09/16/2020   PT End of Session - 09/16/20 0948    Visit Number 2    Number of Visits 8    Date for PT Re-Evaluation 11/02/20    Authorization Type IE 09/07/2020    PT Start Time 0950    PT Stop Time 1045    PT Time Calculation (min) 55 min    Activity Tolerance Patient tolerated treatment well    Behavior During Therapy [facility] for tasks assessed/performed           Past Medical History:  Diagnosis Date  . Anemia   . Arthritis   . Hiatal hernia   . Hypertension   . Sleep apnea     Past Surgical History:  Procedure Laterality Date  . CARPAL TUNNEL RELEASE  2013  . COLONOSCOPY  2007  . COLONOSCOPY WITH PROPOFOL N/A 11/29/2016   Procedure: COLONOSCOPY WITH PROPOFOL;  Surgeon: Robert Bellow, MD;  Location: [facility] ENDOSCOPY;  Service: Endoscopy;  Laterality: N/A;  . ESOPHAGOGASTRODUODENOSCOPY (EGD) WITH PROPOFOL N/A 11/29/2016   Procedure: ESOPHAGOGASTRODUODENOSCOPY (EGD) WITH PROPOFOL;  Surgeon: Robert Bellow, MD;  Location: [facility] ENDOSCOPY;  Service: Endoscopy;  Laterality: N/A;    There were no vitals filed for this visit.   Subjective Assessment - 09/16/20 0952    Subjective Patient had imaging done of spine which showed a lot of degeneration L2-S1, and will follow up with a physiatrist tomorrow. Patient has been wearing heel lift in R shoe inconsistently, and the heel lift still feels unfamiliar.    Currently in Pain? Yes    Pain Score 4     Pain Location Leg    Pain Orientation Upper;Posterior    Pain Descriptors / Indicators Stabbing           TREATMENT  Pre-treatment assessment: IC level with heel lift in R shoe; R thoracic lateral flexion  increased  Manual Therapy: LAD, lateral, SAD mobilizations of R hip for decreased pain and improved mobility, grade II/III  Neuromuscular Re-education: Supine hooklying diaphragmatic breathing with VCs and TCs for downregulation of the nervous system and improved management of IAP Supine knee to chest, B, with diaphragmatic breathing for decreased lumbar pain Supine hooklying, TrA activation with exhalation. VCs and TCs to decrease compensatory patterns and minimize aggravation of the lumbar paraspinals. Seated diaphragmatic breathing with VC and TC PRN Seated TrA activation with exhalation. Patient requires VC and TC for sequencing and decreased compensatory pelvic movement. Seated R sciatic nerve glides, 5x5 for decreased neural tension and pain  Treatments unbilled: MHP to lumbar spine   Patient educated throughout session on appropriate technique and form using multi-modal cueing, HEP, and activity modification. Patient articulated understanding and returned demonstration.  Patient Response to interventions: 2/10 pain  ASSESSMENT Patient presents to clinic with excellent motivation to participate in therapy. Patient demonstrates deficits in R hip mobility, IAP management, RLE strength, pain, gait, and posture. Patient able to achieve significant pain relief with sciatic nerve glides and deep core activation during today's session and responded positively to active interventions. Patient will benefit from continued skilled therapeutic intervention to address remaining deficits in R hip mobility, IAP management, RLE strength, pain, gait, and posture in order to increase function and  improve overall QOL.     PT Long Term Goals - 09/07/20 1419      PT LONG TERM GOAL #1   Title Patient will demonstrate independence with HEP in order to maximize therapeutic gains and improve carryover from physical therapy sessions to ADLs in the home and community.    Baseline IE: not initiated    Time  8    Period Weeks    Status New    Target Date 11/02/20      PT LONG TERM GOAL #2   Title Patient will decrease worst R hip pain as reported on NPRS by at least 2 points to demonstrate clinically significant reduction in pain in order to restore/improve function and overall QOL.    Baseline IE: 9/10    Time 8    Period Weeks    Status New    Target Date 11/02/20      PT LONG TERM GOAL #3   Title Patient will demonstrate improved pain modulation and IAP management as evidenced by ability to practice diaphragmatic breathing with a 1:2 inhalation:exhalation ratio.    Baseline IE: not demonstrated (significant breath holding with guarding)    Time 8    Period Weeks    Status New    Target Date 11/02/20      PT LONG TERM GOAL #4   Title Patient will demonstrate improved function as evidenced by a score of 63 on FOTO measure for full participation in activities at home and in the community.    Baseline IE: 23    Time 8    Period Weeks    Status New    Target Date 11/02/20      PT LONG TERM GOAL #5   Title Patient will demonstrate improved R hip mobility as evidenced by R hip flexion > 115 degrees absent of pain for improved function and mobility.    Baseline IE: 100 degrees and painful    Time 8    Period Weeks    Status New    Target Date 11/02/20                 Plan - 09/16/20 0948    Clinical Impression Statement Patient presents to clinic with excellent motivation to participate in therapy. Patient demonstrates deficits in R hip mobility, IAP management, RLE strength, pain, gait, and posture. Patient able to achieve significant pain relief with sciatic nerve glides and deep core activation during today's session and responded positively to active interventions. Patient will benefit from continued skilled therapeutic intervention to address remaining deficits in R hip mobility, IAP management, RLE strength, pain, gait, and posture in order to increase function and improve  overall QOL.    Personal Factors and Comorbidities Age;Sex;Comorbidity 3+;Time since onset of injury/illness/exacerbation;Past/Current Experience    Comorbidities RA, anemia, HTN, hiatal hernia, sleep apnea    Examination-Activity Limitations Squat;Stairs;Lift;Sit;Locomotion Level;Stand    Examination-Participation Restrictions Church;Yard Work;Meal Prep    Stability/Clinical Decision Making Evolving/Moderate complexity    Rehab Potential Fair    PT Frequency 1x / week    PT Duration 8 weeks    PT Treatment/Interventions Cryotherapy;Electrical Stimulation;Moist Heat;Therapeutic activities;Therapeutic exercise;Neuromuscular re-education;Manual techniques;Patient/family education;Joint Manipulations;Spinal Manipulations;Dry needling;Passive range of motion;Taping;ADLs/Self Care Home Management    PT Next Visit Plan posterior thigh skin rolling, STM, deep core    PT Home Exercise Plan sciatic nerve glides, deep core 1    Consulted and Agree with Plan of Care Patient  Patient will benefit from skilled therapeutic intervention in order to improve the following deficits and impairments:  Abnormal gait, Decreased balance, Difficulty walking, Pain, Postural dysfunction, Improper body mechanics, Decreased activity tolerance, Decreased strength, Decreased range of motion, Hypomobility, Decreased mobility  Visit Diagnosis: Pain in right hip  Difficulty in walking, not elsewhere classified  Muscle weakness (generalized)     Problem List Patient Active Problem List   Diagnosis Date Noted  . Anemia 10/04/2018  . Essential hypertension 10/04/2018  . Rheumatoid arthritis with rheumatoid factor ([place]) 10/04/2018  . Vitamin D deficiency 10/04/2018  . Lung nodule seen on imaging study 08/21/2017  . Benign microscopic hematuria 02/06/2017  . Cardiac murmur 11/08/2016  . Dysphagia 10/11/2016  . Encounter for screening colonoscopy 10/11/2016  . Rectal bleeding 10/11/2016  . Ocular  migraine 08/02/2016  . Recurrent major depressive disorder, in full remission ([place]) 01/27/2016  . Obstructive sleep apnea 09/15/2015   Myles Gip PT, DPT [phone]  09/16/2020, 12:01 PM  [facility] [address] [place], [place], [address] Phone: [phone]   Fax:  [phone]  Name: KALLYN DEMARCUS MRN: [id number] Date of Birth: Dec 26, 1955

## 2020-09-17 ENCOUNTER — Other Ambulatory Visit: Payer: Self-pay | Admitting: Physical Medicine and Rehabilitation

## 2020-09-17 DIAGNOSIS — K6289 Other specified diseases of anus and rectum: Secondary | ICD-10-CM

## 2020-09-17 DIAGNOSIS — G8929 Other chronic pain: Secondary | ICD-10-CM

## 2020-09-17 DIAGNOSIS — M5441 Lumbago with sciatica, right side: Secondary | ICD-10-CM

## 2020-09-23 ENCOUNTER — Ambulatory Visit: Payer: BC Managed Care – PPO | Admitting: Physical Therapy

## 2020-09-23 ENCOUNTER — Other Ambulatory Visit: Payer: Self-pay

## 2020-09-23 ENCOUNTER — Encounter: Payer: Self-pay | Admitting: Physical Therapy

## 2020-09-23 DIAGNOSIS — R262 Difficulty in walking, not elsewhere classified: Secondary | ICD-10-CM

## 2020-09-23 DIAGNOSIS — M6281 Muscle weakness (generalized): Secondary | ICD-10-CM

## 2020-09-23 DIAGNOSIS — M25551 Pain in right hip: Secondary | ICD-10-CM | POA: Diagnosis not present

## 2020-09-23 NOTE — Therapy (Signed)
[employer] [facility] [address]. [place], [place], [address] Phone: [phone]   Fax:  [phone]  Physical Therapy Treatment  Patient Details  Name: Janice Rowe MRN: [id number] Date of Birth: January 24, 1956 Referring Provider (PT): Denver Faster   Encounter Date: 09/23/2020   PT End of Session - 09/23/20 1009    Visit Number 3    Number of Visits 8    Date for PT Re-Evaluation 11/02/20    Authorization Type IE 09/07/2020    PT Start Time 1000    PT Stop Time 1055    PT Time Calculation (min) 55 min    Activity Tolerance Patient tolerated treatment well    Behavior During Therapy [facility] for tasks assessed/performed           Past Medical History:  Diagnosis Date  . Anemia   . Arthritis   . Hiatal hernia   . Hypertension   . Sleep apnea     Past Surgical History:  Procedure Laterality Date  . CARPAL TUNNEL RELEASE  2013  . COLONOSCOPY  2007  . COLONOSCOPY WITH PROPOFOL N/A 11/29/2016   Procedure: COLONOSCOPY WITH PROPOFOL;  Surgeon: Robert Bellow, MD;  Location: [facility] ENDOSCOPY;  Service: Endoscopy;  Laterality: N/A;  . ESOPHAGOGASTRODUODENOSCOPY (EGD) WITH PROPOFOL N/A 11/29/2016   Procedure: ESOPHAGOGASTRODUODENOSCOPY (EGD) WITH PROPOFOL;  Surgeon: Robert Bellow, MD;  Location: [facility] ENDOSCOPY;  Service: Endoscopy;  Laterality: N/A;    There were no vitals filed for this visit.   Subjective Assessment - 09/23/20 1005    Subjective Patient notes that she has had some increased pain this week and has taken a break from her orthotics. She notes her R hip is a little clicky but not as bad. She does have some increased R thigh pain which she is thinking might be related to doing her R sciatic nerve glides.    Currently in Pain? No/denies           TREATMENT  Manual Therapy: STM and TPR performed to R thigh and hip complex to allow for decreased tension and pain and improved posture and function with vibratory and percussive  device  Neuromuscular Re-education: Supine hooklying diaphragmatic breathing with VCs and TCs for downregulation of the nervous system and improved management of IAP Patient education on pain coping skill: scheduling pleasing activity and focus on function as opposed to pain. Sidelying R clamshell with RTB and without, to fatigue Patient education on sciatic nerve pathway and impact of gait mechanics on sciatic nerve root tension.  Treatments unbilled: MHP to lumbar spine   Patient educated throughout session on appropriate technique and form using multi-modal cueing, HEP, and activity modification. Patient articulated understanding and returned demonstration.  Patient Response to interventions: 2/10 pain  ASSESSMENT Patient presents to clinic with excellent motivation to participate in therapy. Patient demonstrates deficits in R hip mobility, IAP management, RLE strength, pain, gait, and posture. Patient able to perform R glute medius strengthening with excellent form during today's session and responded positively to active and manual interventions. Patient will benefit from continued skilled therapeutic intervention to address remaining deficits in R hip mobility, IAP management, RLE strength, pain, gait, and posture in order to increase function and improve overall QOL.     PT Long Term Goals - 09/07/20 1419      PT LONG TERM GOAL #1   Title Patient will demonstrate independence with HEP in order to maximize therapeutic gains and improve carryover from physical  therapy sessions to ADLs in the home and community.    Baseline IE: not initiated    Time 8    Period Weeks    Status New    Target Date 11/02/20      PT LONG TERM GOAL #2   Title Patient will decrease worst R hip pain as reported on NPRS by at least 2 points to demonstrate clinically significant reduction in pain in order to restore/improve function and overall QOL.    Baseline IE: 9/10    Time 8    Period Weeks     Status New    Target Date 11/02/20      PT LONG TERM GOAL #3   Title Patient will demonstrate improved pain modulation and IAP management as evidenced by ability to practice diaphragmatic breathing with a 1:2 inhalation:exhalation ratio.    Baseline IE: not demonstrated (significant breath holding with guarding)    Time 8    Period Weeks    Status New    Target Date 11/02/20      PT LONG TERM GOAL #4   Title Patient will demonstrate improved function as evidenced by a score of 63 on FOTO measure for full participation in activities at home and in the community.    Baseline IE: 27    Time 8    Period Weeks    Status New    Target Date 11/02/20      PT LONG TERM GOAL #5   Title Patient will demonstrate improved R hip mobility as evidenced by R hip flexion > 115 degrees absent of pain for improved function and mobility.    Baseline IE: 100 degrees and painful    Time 8    Period Weeks    Status New    Target Date 11/02/20                 Plan - 09/23/20 1010    Clinical Impression Statement Patient presents to clinic with excellent motivation to participate in therapy. Patient demonstrates deficits in R hip mobility, IAP management, RLE strength, pain, gait, and posture. Patient able to perform R glute medius strengthening with excellent form during today's session and responded positively to active and manual interventions. Patient will benefit from continued skilled therapeutic intervention to address remaining deficits in R hip mobility, IAP management, RLE strength, pain, gait, and posture in order to increase function and improve overall QOL.    Personal Factors and Comorbidities Age;Sex;Comorbidity 3+;Time since onset of injury/illness/exacerbation;Past/Current Experience    Comorbidities RA, anemia, HTN, hiatal hernia, sleep apnea    Examination-Activity Limitations Squat;Stairs;Lift;Sit;Locomotion Level;Stand    Examination-Participation Restrictions Church;Yard  Work;Meal Prep    Stability/Clinical Decision Making Evolving/Moderate complexity    Rehab Potential Fair    PT Frequency 1x / week    PT Duration 8 weeks    PT Treatment/Interventions Cryotherapy;Electrical Stimulation;Moist Heat;Therapeutic activities;Therapeutic exercise;Neuromuscular re-education;Manual techniques;Patient/family education;Joint Manipulations;Spinal Manipulations;Dry needling;Passive range of motion;Taping;ADLs/Self Care Home Management    PT Next Visit Plan posterior thigh skin rolling, STM, deep core    PT Home Exercise Plan sciatic nerve glides, deep core 1    Consulted and Agree with Plan of Care Patient           Patient will benefit from skilled therapeutic intervention in order to improve the following deficits and impairments:  Abnormal gait, Decreased balance, Difficulty walking, Pain, Postural dysfunction, Improper body mechanics, Decreased activity tolerance, Decreased strength, Decreased range of motion, Hypomobility, Decreased mobility  Visit  Diagnosis: Pain in right hip  Difficulty in walking, not elsewhere classified  Muscle weakness (generalized)     Problem List Patient Active Problem List   Diagnosis Date Noted  . Anemia 10/04/2018  . Essential hypertension 10/04/2018  . Rheumatoid arthritis with rheumatoid factor ([place]) 10/04/2018  . Vitamin D deficiency 10/04/2018  . Lung nodule seen on imaging study 08/21/2017  . Benign microscopic hematuria 02/06/2017  . Cardiac murmur 11/08/2016  . Dysphagia 10/11/2016  . Encounter for screening colonoscopy 10/11/2016  . Rectal bleeding 10/11/2016  . Ocular migraine 08/02/2016  . Recurrent major depressive disorder, in full remission ([place]) 01/27/2016  . Obstructive sleep apnea 09/15/2015   Myles Gip PT, DPT [phone]  09/23/2020, 12:25 PM  [place] [facility] [address] [place], [place], [address] Phone: [phone]   Fax:  [phone]  Name: SIRI BUEGE MRN: [id number] Date of Birth: 05/23/1956

## 2020-09-28 ENCOUNTER — Encounter: Payer: Self-pay | Admitting: Physical Therapy

## 2020-09-28 ENCOUNTER — Ambulatory Visit: Payer: BC Managed Care – PPO | Attending: Obstetrics and Gynecology | Admitting: Physical Therapy

## 2020-09-28 ENCOUNTER — Other Ambulatory Visit: Payer: Self-pay

## 2020-09-28 DIAGNOSIS — M6281 Muscle weakness (generalized): Secondary | ICD-10-CM | POA: Diagnosis present

## 2020-09-28 DIAGNOSIS — M25551 Pain in right hip: Secondary | ICD-10-CM | POA: Insufficient documentation

## 2020-09-28 DIAGNOSIS — R262 Difficulty in walking, not elsewhere classified: Secondary | ICD-10-CM

## 2020-09-28 NOTE — Therapy (Signed)
[facility] [facility] [address]. [place], [place], [address] Phone: [phone]   Fax:  [phone]  Physical Therapy Treatment  Patient Details  Name: Janice Rowe MRN: [id number] Date of Birth: Oct 04, 1956 Referring Provider (PT): Denver Faster   Encounter Date: 09/28/2020   PT End of Session - 09/28/20 1107    Visit Number 4    Number of Visits 8    Date for PT Re-Evaluation 11/02/20    Authorization Type IE 09/07/2020    PT Start Time 1000    PT Stop Time 1055    PT Time Calculation (min) 55 min    Activity Tolerance Patient tolerated treatment well    Behavior During Therapy [facility] for tasks assessed/performed           Past Medical History:  Diagnosis Date  . Anemia   . Arthritis   . Hiatal hernia   . Hypertension   . Sleep apnea     Past Surgical History:  Procedure Laterality Date  . CARPAL TUNNEL RELEASE  2013  . COLONOSCOPY  2007  . COLONOSCOPY WITH PROPOFOL N/A 11/29/2016   Procedure: COLONOSCOPY WITH PROPOFOL;  Surgeon: Robert Bellow, MD;  Location: [facility] ENDOSCOPY;  Service: Endoscopy;  Laterality: N/A;  . ESOPHAGOGASTRODUODENOSCOPY (EGD) WITH PROPOFOL N/A 11/29/2016   Procedure: ESOPHAGOGASTRODUODENOSCOPY (EGD) WITH PROPOFOL;  Surgeon: Robert Bellow, MD;  Location: [facility] ENDOSCOPY;  Service: Endoscopy;  Laterality: N/A;    There were no vitals filed for this visit.   Subjective Assessment - 09/28/20 1104    Subjective Patient notes that she was able to work in the yard and felt more like herself and was not in much pain. Pain increased over the course of the day working in the yard (worst pain 8/10) by end of day. Patient notes she didn't have too bad of a night, but still woke up 2-3 times last night 2/2 to pain.    Currently in Pain? Yes    Pain Score 2     Pain Location Hip           TREATMENT  Manual Therapy: STM and TPR performed to R thigh and hip complex to allow for decreased tension and pain and  improved posture and function with vibratory and percussive device  Neuromuscular Re-education: Supine hooklying diaphragmatic breathing with VCs and TCs for downregulation of the nervous system and improved management of IAP Supine TrA with coordinated breath and tactile cueing for improved closure of upper abdominals and postural awareness Supine TrA with GTB serratus punch and coordinate breath for improved closure of upper abdominals and postural awareness Seated TrA with GTB serratus punch and coordinate breath for improved closure of upper abdominals and postural awareness  Treatments unbilled: MHP to lumbar spine   Patient educated throughout session on appropriate technique and form using multi-modal cueing, HEP, and activity modification. Patient articulated understanding and returned demonstration.  Patient Response to interventions: Comfortable to add rib flare corrective exercise  ASSESSMENT Patient presents to clinic with excellent motivation to participate in therapy. Patient demonstrates deficits in R hip mobility, IAP management, RLE strength, pain, gait, and posture. Patient with good rib flare closure/depression absent of cueing on increased reps during today's session and responded positively to active and manual interventions. Patient will benefit from continued skilled therapeutic intervention to address remaining deficits in R hip mobility, IAP management, RLE strength, pain, gait, and posture in order to increase function and improve overall QOL.  PT Long Term Goals - 09/07/20 1419      PT LONG TERM GOAL #1   Title Patient will demonstrate independence with HEP in order to maximize therapeutic gains and improve carryover from physical therapy sessions to ADLs in the home and community.    Baseline IE: not initiated    Time 8    Period Weeks    Status New    Target Date 11/02/20      PT LONG TERM GOAL #2   Title Patient will decrease worst R hip pain as  reported on NPRS by at least 2 points to demonstrate clinically significant reduction in pain in order to restore/improve function and overall QOL.    Baseline IE: 9/10    Time 8    Period Weeks    Status New    Target Date 11/02/20      PT LONG TERM GOAL #3   Title Patient will demonstrate improved pain modulation and IAP management as evidenced by ability to practice diaphragmatic breathing with a 1:2 inhalation:exhalation ratio.    Baseline IE: not demonstrated (significant breath holding with guarding)    Time 8    Period Weeks    Status New    Target Date 11/02/20      PT LONG TERM GOAL #4   Title Patient will demonstrate improved function as evidenced by a score of 63 on FOTO measure for full participation in activities at home and in the community.    Baseline IE: 43    Time 8    Period Weeks    Status New    Target Date 11/02/20      PT LONG TERM GOAL #5   Title Patient will demonstrate improved R hip mobility as evidenced by R hip flexion > 115 degrees absent of pain for improved function and mobility.    Baseline IE: 100 degrees and painful    Time 8    Period Weeks    Status New    Target Date 11/02/20                 Plan - 09/28/20 1107    Clinical Impression Statement Patient presents to clinic with excellent motivation to participate in therapy. Patient demonstrates deficits in R hip mobility, IAP management, RLE strength, pain, gait, and posture. Patient with good rib flare closure/depression absent of cueing on increased reps during today's session and responded positively to active and manual interventions. Patient will benefit from continued skilled therapeutic intervention to address remaining deficits in R hip mobility, IAP management, RLE strength, pain, gait, and posture in order to increase function and improve overall QOL.    Personal Factors and Comorbidities Age;Sex;Comorbidity 3+;Time since onset of injury/illness/exacerbation;Past/Current  Experience    Comorbidities RA, anemia, HTN, hiatal hernia, sleep apnea    Examination-Activity Limitations Squat;Stairs;Lift;Sit;Locomotion Level;Stand    Examination-Participation Restrictions Church;Yard Work;Meal Prep    Stability/Clinical Decision Making Evolving/Moderate complexity    Rehab Potential Fair    PT Frequency 1x / week    PT Duration 8 weeks    PT Treatment/Interventions Cryotherapy;Electrical Stimulation;Moist Heat;Therapeutic activities;Therapeutic exercise;Neuromuscular re-education;Manual techniques;Patient/family education;Joint Manipulations;Spinal Manipulations;Dry needling;Passive range of motion;Taping;ADLs/Self Care Home Management    PT Next Visit Plan posterior thigh skin rolling, STM, deep core    PT Home Exercise Plan sciatic nerve glides, deep core 1    Consulted and Agree with Plan of Care Patient           Patient will benefit from skilled  therapeutic intervention in order to improve the following deficits and impairments:  Abnormal gait, Decreased balance, Difficulty walking, Pain, Postural dysfunction, Improper body mechanics, Decreased activity tolerance, Decreased strength, Decreased range of motion, Hypomobility, Decreased mobility  Visit Diagnosis: Pain in right hip  Difficulty in walking, not elsewhere classified  Muscle weakness (generalized)     Problem List Patient Active Problem List   Diagnosis Date Noted  . Anemia 10/04/2018  . Essential hypertension 10/04/2018  . Rheumatoid arthritis with rheumatoid factor ([place]) 10/04/2018  . Vitamin D deficiency 10/04/2018  . Lung nodule seen on imaging study 08/21/2017  . Benign microscopic hematuria 02/06/2017  . Cardiac murmur 11/08/2016  . Dysphagia 10/11/2016  . Encounter for screening colonoscopy 10/11/2016  . Rectal bleeding 10/11/2016  . Ocular migraine 08/02/2016  . Recurrent major depressive disorder, in full remission ([place]) 01/27/2016  . Obstructive sleep apnea 09/15/2015    Myles Gip PT, DPT [phone] 09/28/2020, 1:25 PM  [place] [facility] [address] [place], [place], [address] Phone: [phone]   Fax:  [phone]  Name: NESA DISTEL MRN: [id number] Date of Birth: [birth year]

## 2020-09-30 ENCOUNTER — Ambulatory Visit: Payer: BC Managed Care – PPO | Admitting: Physical Therapy

## 2020-10-06 ENCOUNTER — Encounter: Payer: Self-pay | Admitting: Physical Therapy

## 2020-10-06 ENCOUNTER — Other Ambulatory Visit: Payer: Self-pay

## 2020-10-06 ENCOUNTER — Ambulatory Visit: Payer: BC Managed Care – PPO | Admitting: Physical Therapy

## 2020-10-06 DIAGNOSIS — R262 Difficulty in walking, not elsewhere classified: Secondary | ICD-10-CM

## 2020-10-06 DIAGNOSIS — M25551 Pain in right hip: Secondary | ICD-10-CM | POA: Diagnosis not present

## 2020-10-06 DIAGNOSIS — M6281 Muscle weakness (generalized): Secondary | ICD-10-CM

## 2020-10-06 NOTE — Therapy (Signed)
[place] [facility] [address] [place], [place], [address] Phone: [phone]   Fax:  [phone]  Physical Therapy Treatment  Patient Details  Name: Janice Rowe MRN: [id number] Date of Birth: 1956-08-15 Referring Provider (PT): Denver Faster   Encounter Date: 10/06/2020   PT End of Session - 10/06/20 1009    Visit Number 5    Number of Visits 8    Date for PT Re-Evaluation 11/02/20    Authorization Type IE 09/07/2020    PT Start Time 1000    PT Stop Time 1055    PT Time Calculation (min) 55 min    Activity Tolerance Patient tolerated treatment well    Behavior During Therapy [facility] for tasks assessed/performed           Past Medical History:  Diagnosis Date  . Anemia   . Arthritis   . Hiatal hernia   . Hypertension   . Sleep apnea     Past Surgical History:  Procedure Laterality Date  . CARPAL TUNNEL RELEASE  2013  . COLONOSCOPY  2007  . COLONOSCOPY WITH PROPOFOL N/A 11/29/2016   Procedure: COLONOSCOPY WITH PROPOFOL;  Surgeon: Robert Bellow, MD;  Location: [facility] ENDOSCOPY;  Service: Endoscopy;  Laterality: N/A;  . ESOPHAGOGASTRODUODENOSCOPY (EGD) WITH PROPOFOL N/A 11/29/2016   Procedure: ESOPHAGOGASTRODUODENOSCOPY (EGD) WITH PROPOFOL;  Surgeon: Robert Bellow, MD;  Location: [facility] ENDOSCOPY;  Service: Endoscopy;  Laterality: N/A;    There were no vitals filed for this visit.   Subjective Assessment - 10/06/20 1000    Subjective Patient reports that she is in pain today. She woke up with pain running down the front of the RLE to the foot/ankle. Patient also noted that pain in R hip was also elevated this AM as well as down the back of the leg. Patient notes she did use a gas powered leaf blower yesterday which started to increase pain.    Currently in Pain? Yes    Pain Score 4     Pain Location Leg    Pain Orientation Right    Pain Descriptors / Indicators Stabbing           TREATMENT  Manual Therapy: STM and  TPR performed to R thigh and hip complex to allow for decreased tension and pain and improved posture and function with vibratory and percussive device  Neuromuscular Re-education: Supine hooklying diaphragmatic breathing with VCs and TCs for downregulation of the nervous system and improved management of IAP Sciatic and femoral nerve glides for improved pain modulation and neural mobility Patient education on activity modification and activity pacing for improved pain coping.   Patient educated throughout session on appropriate technique and form using multi-modal cueing, HEP, and activity modification. Patient articulated understanding and returned demonstration.  Patient Response to interventions: Patient commits to pacing activity via a schedule.  ASSESSMENT Patient presents to clinic with excellent motivation to participate in therapy. Patient demonstrates deficits in R hip mobility, IAP management, RLE strength, pain, gait, and posture. Patient had 1-2 point reduction in pain during today's session and responded positively to active and manual interventions. Patient noted improved walking and posture on leaving session. Patient will benefit from continued skilled therapeutic intervention to address remaining deficits in R hip mobility, IAP management, RLE strength, pain, gait, and posture in order to increase function and improve overall QOL.      PT Long Term Goals - 09/07/20 1419      PT LONG TERM GOAL #  1   Title Patient will demonstrate independence with HEP in order to maximize therapeutic gains and improve carryover from physical therapy sessions to ADLs in the home and community.    Baseline IE: not initiated    Time 8    Period Weeks    Status New    Target Date 11/02/20      PT LONG TERM GOAL #2   Title Patient will decrease worst R hip pain as reported on NPRS by at least 2 points to demonstrate clinically significant reduction in pain in order to restore/improve function  and overall QOL.    Baseline IE: 9/10    Time 8    Period Weeks    Status New    Target Date 11/02/20      PT LONG TERM GOAL #3   Title Patient will demonstrate improved pain modulation and IAP management as evidenced by ability to practice diaphragmatic breathing with a 1:2 inhalation:exhalation ratio.    Baseline IE: not demonstrated (significant breath holding with guarding)    Time 8    Period Weeks    Status New    Target Date 11/02/20      PT LONG TERM GOAL #4   Title Patient will demonstrate improved function as evidenced by a score of 63 on FOTO measure for full participation in activities at home and in the community.    Baseline IE: 20    Time 8    Period Weeks    Status New    Target Date 11/02/20      PT LONG TERM GOAL #5   Title Patient will demonstrate improved R hip mobility as evidenced by R hip flexion > 115 degrees absent of pain for improved function and mobility.    Baseline IE: 100 degrees and painful    Time 8    Period Weeks    Status New    Target Date 11/02/20                 Plan - 10/06/20 1010    Clinical Impression Statement Patient presents to clinic with excellent motivation to participate in therapy. Patient demonstrates deficits in R hip mobility, IAP management, RLE strength, pain, gait, and posture. Patient had 1-2 point reduction in pain during today's session and responded positively to active and manual interventions. Patient noted improved walking and posture on leaving session. Patient will benefit from continued skilled therapeutic intervention to address remaining deficits in R hip mobility, IAP management, RLE strength, pain, gait, and posture in order to increase function and improve overall QOL.    Personal Factors and Comorbidities Age;Sex;Comorbidity 3+;Time since onset of injury/illness/exacerbation;Past/Current Experience    Comorbidities RA, anemia, HTN, hiatal hernia, sleep apnea    Examination-Activity Limitations  Squat;Stairs;Lift;Sit;Locomotion Level;Stand    Examination-Participation Restrictions Church;Yard Work;Meal Prep    Stability/Clinical Decision Making Evolving/Moderate complexity    Rehab Potential Fair    PT Frequency 1x / week    PT Duration 8 weeks    PT Treatment/Interventions Cryotherapy;Electrical Stimulation;Moist Heat;Therapeutic activities;Therapeutic exercise;Neuromuscular re-education;Manual techniques;Patient/family education;Joint Manipulations;Spinal Manipulations;Dry needling;Passive range of motion;Taping;ADLs/Self Care Home Management    PT Next Visit Plan posterior thigh skin rolling, STM, deep core    PT Home Exercise Plan sciatic nerve glides, deep core 1    Consulted and Agree with Plan of Care Patient           Patient will benefit from skilled therapeutic intervention in order to improve the following deficits and impairments:  Abnormal gait, Decreased balance, Difficulty walking, Pain, Postural dysfunction, Improper body mechanics, Decreased activity tolerance, Decreased strength, Decreased range of motion, Hypomobility, Decreased mobility  Visit Diagnosis: Pain in right hip  Difficulty in walking, not elsewhere classified  Muscle weakness (generalized)     Problem List Patient Active Problem List   Diagnosis Date Noted  . Anemia 10/04/2018  . Essential hypertension 10/04/2018  . Rheumatoid arthritis with rheumatoid factor ([place]) 10/04/2018  . Vitamin D deficiency 10/04/2018  . Lung nodule seen on imaging study 08/21/2017  . Benign microscopic hematuria 02/06/2017  . Cardiac murmur 11/08/2016  . Dysphagia 10/11/2016  . Encounter for screening colonoscopy 10/11/2016  . Rectal bleeding 10/11/2016  . Ocular migraine 08/02/2016  . Recurrent major depressive disorder, in full remission ([place]) 01/27/2016  . Obstructive sleep apnea 09/15/2015   Myles Gip PT, DPT [phone]  10/06/2020, 12:56 PM  [place] [facility] [address] [place], [place], [address] Phone: [phone]   Fax:  [phone]  Name: IZABELA OW MRN: [id number] Date of Birth: 03-16-1956

## 2020-10-09 ENCOUNTER — Ambulatory Visit
Admission: RE | Admit: 2020-10-09 | Discharge: 2020-10-09 | Disposition: A | Discharge status: Home / Self Care | Payer: BC Managed Care – PPO | Source: Ambulatory Visit | Attending: Physical Medicine and Rehabilitation | Admitting: Physical Medicine and Rehabilitation

## 2020-10-09 ENCOUNTER — Other Ambulatory Visit: Payer: Self-pay

## 2020-10-09 DIAGNOSIS — G8929 Other chronic pain: Secondary | ICD-10-CM

## 2020-10-09 DIAGNOSIS — K6289 Other specified diseases of anus and rectum: Secondary | ICD-10-CM

## 2020-10-09 DIAGNOSIS — M5441 Lumbago with sciatica, right side: Secondary | ICD-10-CM

## 2020-10-12 ENCOUNTER — Other Ambulatory Visit: Payer: Self-pay

## 2020-10-12 ENCOUNTER — Ambulatory Visit: Payer: BC Managed Care – PPO | Admitting: Physical Therapy

## 2020-10-12 DIAGNOSIS — M25551 Pain in right hip: Secondary | ICD-10-CM

## 2020-10-12 DIAGNOSIS — R262 Difficulty in walking, not elsewhere classified: Secondary | ICD-10-CM

## 2020-10-12 DIAGNOSIS — M6281 Muscle weakness (generalized): Secondary | ICD-10-CM

## 2020-10-12 NOTE — Therapy (Signed)
[place] [facility] [address] [place], [place], [address] Phone: [phone]   Fax:  [phone]  Physical Therapy Treatment  Patient Details  Name: Janice Rowe MRN: [id number] Date of Birth: 1956-03-16 Referring Provider (PT): Denver Faster   Encounter Date: 10/12/2020   PT End of Session - 10/12/20 1749    Visit Number 6    Number of Visits 8    Date for PT Re-Evaluation 11/02/20    Authorization Type IE 09/07/2020    PT Start Time 1600    PT Stop Time 1655    PT Time Calculation (min) 55 min    Activity Tolerance Patient tolerated treatment well    Behavior During Therapy [facility] for tasks assessed/performed           Past Medical History:  Diagnosis Date  . Anemia   . Arthritis   . Hiatal hernia   . Hypertension   . Sleep apnea     Past Surgical History:  Procedure Laterality Date  . CARPAL TUNNEL RELEASE  2013  . COLONOSCOPY  2007  . COLONOSCOPY WITH PROPOFOL N/A 11/29/2016   Procedure: COLONOSCOPY WITH PROPOFOL;  Surgeon: Robert Bellow, MD;  Location: [facility] ENDOSCOPY;  Service: Endoscopy;  Laterality: N/A;  . ESOPHAGOGASTRODUODENOSCOPY (EGD) WITH PROPOFOL N/A 11/29/2016   Procedure: ESOPHAGOGASTRODUODENOSCOPY (EGD) WITH PROPOFOL;  Surgeon: Robert Bellow, MD;  Location: [facility] ENDOSCOPY;  Service: Endoscopy;  Laterality: N/A;    There were no vitals filed for this visit.   Subjective Assessment - 10/12/20 1746    Subjective Patient notes increased pain today from a trip to the [place] over the weekend. She notes she was able to walk around the [place] with a cane, but felt she walked a bit better without the cane. Patient notes that during the tour when she had soem anterior thigh pain she was able to do her standing femoral nerve glides to achieve some relief. She also got the results from recent imaging indicative of L4-L5 changes.    Currently in Pain? Yes    Pain Score 4            TREATMENT  Manual  Therapy: STM and TPR performed to R thigh and hip complex to allow for decreased tension and pain and improved posture and function with vibratory and percussive device R posterior innominate mobilizations for improved gapping and neural mobility R lumbar lateral glides for improved mobility and decreased pain  Neuromuscular Re-education: Sidelying diaphragmatic breathing with VCs and TCs for downregulation of the nervous system and improved management of IAP Sidelying R psoas stretch for improved hip extension and decreased anterior hip pain Supine modified Thomas stretch for improved hip extension and decreased anterior hip pain Patient education on L4-L5 dermatomal pattern for improved understanding of symptoms in RLE.   Patient educated throughout session on appropriate technique and form using multi-modal cueing, HEP, and activity modification. Patient articulated understanding and returned demonstration.  Patient Response to interventions: Patient notes ability to stand upright and ease of walking  ASSESSMENT Patient presents to clinic with excellent motivation to participate in therapy. Patient demonstrates deficits in R hip mobility, IAP management, RLE strength, pain, gait, and posture. Patient able to achieve erect posture at end of session and performed R psoas stretch with good form during today's session and responded positively to active and manual interventions. Patient will benefit from continued skilled therapeutic intervention to address remaining deficits in R hip mobility, IAP management, RLE strength, pain,  gait, and posture in order to increase function and improve overall QOL.      PT Long Term Goals - 09/07/20 1419      PT LONG TERM GOAL #1   Title Patient will demonstrate independence with HEP in order to maximize therapeutic gains and improve carryover from physical therapy sessions to ADLs in the home and community.    Baseline IE: not initiated    Time 8     Period Weeks    Status New    Target Date 11/02/20      PT LONG TERM GOAL #2   Title Patient will decrease worst R hip pain as reported on NPRS by at least 2 points to demonstrate clinically significant reduction in pain in order to restore/improve function and overall QOL.    Baseline IE: 9/10    Time 8    Period Weeks    Status New    Target Date 11/02/20      PT LONG TERM GOAL #3   Title Patient will demonstrate improved pain modulation and IAP management as evidenced by ability to practice diaphragmatic breathing with a 1:2 inhalation:exhalation ratio.    Baseline IE: not demonstrated (significant breath holding with guarding)    Time 8    Period Weeks    Status New    Target Date 11/02/20      PT LONG TERM GOAL #4   Title Patient will demonstrate improved function as evidenced by a score of 63 on FOTO measure for full participation in activities at home and in the community.    Baseline IE: 71    Time 8    Period Weeks    Status New    Target Date 11/02/20      PT LONG TERM GOAL #5   Title Patient will demonstrate improved R hip mobility as evidenced by R hip flexion > 115 degrees absent of pain for improved function and mobility.    Baseline IE: 100 degrees and painful    Time 8    Period Weeks    Status New    Target Date 11/02/20                 Plan - 10/12/20 1749    Clinical Impression Statement Patient presents to clinic with excellent motivation to participate in therapy. Patient demonstrates deficits in R hip mobility, IAP management, RLE strength, pain, gait, and posture. Patient able to achieve erect posture at end of session and performed R psoas stretch with good form during today's session and responded positively to active and manual interventions. Patient will benefit from continued skilled therapeutic intervention to address remaining deficits in R hip mobility, IAP management, RLE strength, pain, gait, and posture in order to increase function and  improve overall QOL.    Personal Factors and Comorbidities Age;Sex;Comorbidity 3+;Time since onset of injury/illness/exacerbation;Past/Current Experience    Comorbidities RA, anemia, HTN, hiatal hernia, sleep apnea    Examination-Activity Limitations Squat;Stairs;Lift;Sit;Locomotion Level;Stand    Examination-Participation Restrictions Church;Yard Work;Meal Prep    Stability/Clinical Decision Making Evolving/Moderate complexity    Rehab Potential Fair    PT Frequency 1x / week    PT Duration 8 weeks    PT Treatment/Interventions Cryotherapy;Electrical Stimulation;Moist Heat;Therapeutic activities;Therapeutic exercise;Neuromuscular re-education;Manual techniques;Patient/family education;Joint Manipulations;Spinal Manipulations;Dry needling;Passive range of motion;Taping;ADLs/Self Care Home Management    PT Next Visit Plan posterior thigh skin rolling, STM, deep core    PT Home Exercise Plan sciatic nerve glides, deep core 1  Consulted and Agree with Plan of Care Patient           Patient will benefit from skilled therapeutic intervention in order to improve the following deficits and impairments:  Abnormal gait, Decreased balance, Difficulty walking, Pain, Postural dysfunction, Improper body mechanics, Decreased activity tolerance, Decreased strength, Decreased range of motion, Hypomobility, Decreased mobility  Visit Diagnosis: Pain in right hip  Difficulty in walking, not elsewhere classified  Muscle weakness (generalized)     Problem List Patient Active Problem List   Diagnosis Date Noted  . Anemia 10/04/2018  . Essential hypertension 10/04/2018  . Rheumatoid arthritis with rheumatoid factor ([place]) 10/04/2018  . Vitamin D deficiency 10/04/2018  . Lung nodule seen on imaging study 08/21/2017  . Benign microscopic hematuria 02/06/2017  . Cardiac murmur 11/08/2016  . Dysphagia 10/11/2016  . Encounter for screening colonoscopy 10/11/2016  . Rectal bleeding 10/11/2016  .  Ocular migraine 08/02/2016  . Recurrent major depressive disorder, in full remission ([place]) 01/27/2016  . Obstructive sleep apnea 09/15/2015   Myles Gip PT, DPT [phone]  10/12/2020, 5:57 PM  [place] [facility] [address] [place], [place], [address] Phone: [phone]   Fax:  [phone]  Name: ANNYA LIZANA MRN: [id number] Date of Birth: 10-29-1956

## 2020-10-19 ENCOUNTER — Ambulatory Visit: Payer: BC Managed Care – PPO | Admitting: Physical Therapy

## 2020-10-19 ENCOUNTER — Encounter: Payer: Self-pay | Admitting: Physical Therapy

## 2020-10-19 ENCOUNTER — Other Ambulatory Visit: Payer: Self-pay

## 2020-10-19 DIAGNOSIS — M25551 Pain in right hip: Secondary | ICD-10-CM

## 2020-10-19 DIAGNOSIS — R262 Difficulty in walking, not elsewhere classified: Secondary | ICD-10-CM

## 2020-10-19 DIAGNOSIS — M6281 Muscle weakness (generalized): Secondary | ICD-10-CM

## 2020-10-19 NOTE — Therapy (Signed)
[place] [facility] [address] [place], [place], [address] Phone: [phone]   Fax:  [phone]  Physical Therapy Treatment  Patient Details  Name: Janice Rowe MRN: [id number] Date of Birth: 08/17/1956 Referring Provider (PT): Denver Faster   Encounter Date: 10/19/2020   PT End of Session - 10/19/20 1110    Visit Number 7    Number of Visits 8    Date for PT Re-Evaluation 11/02/20    Authorization Type IE 09/07/2020    PT Start Time 1100    PT Stop Time 1155    PT Time Calculation (min) 55 min    Activity Tolerance Patient tolerated treatment well    Behavior During Therapy [facility] for tasks assessed/performed           Past Medical History:  Diagnosis Date  . Anemia   . Arthritis   . Hiatal hernia   . Hypertension   . Sleep apnea     Past Surgical History:  Procedure Laterality Date  . CARPAL TUNNEL RELEASE  2013  . COLONOSCOPY  2007  . COLONOSCOPY WITH PROPOFOL N/A 11/29/2016   Procedure: COLONOSCOPY WITH PROPOFOL;  Surgeon: Robert Bellow, MD;  Location: [facility] ENDOSCOPY;  Service: Endoscopy;  Laterality: N/A;  . ESOPHAGOGASTRODUODENOSCOPY (EGD) WITH PROPOFOL N/A 11/29/2016   Procedure: ESOPHAGOGASTRODUODENOSCOPY (EGD) WITH PROPOFOL;  Surgeon: Robert Bellow, MD;  Location: [facility] ENDOSCOPY;  Service: Endoscopy;  Laterality: N/A;    There were no vitals filed for this visit.   Subjective Assessment - 10/19/20 1106    Subjective Patient reports that she had injections in R hip last Thursday. Patient has had good relief and notes she is walking better. She states overall she has improved greatly since starting PT, but she has had some increased R groin pain since the injection as well as pain over the R greater trochanter. Patient describes the R groin pain as a little pull. She has not been exercising since the injection and has not lifted heavy.    Currently in Pain? Yes    Pain Score 2     Pain Location Groin    Pain  Orientation Right    Pain Descriptors / Indicators Tightness           TREATMENT  Manual Therapy: STM and TPR performed to R gluteal complex and R psoas to allow for decreased tension and pain and improved posture and function with vibratory and percussive device R posterior innominate mobilizations for improved gapping and neural mobility R lumbar lateral glides for improved mobility and decreased pain  Neuromuscular Re-education: Sidelying diaphragmatic breathing with VCs and TCs for downregulation of the nervous system and improved management of IAP Sidelying R psoas stretch for improved hip extension and decreased anterior hip pain Deep core training for improved postural control:  TrA with coordinated breath and tactile feedbakc  TrA with coordinated breath and SUE challenge  TrA with coordinated breath and BUE challenge   Patient educated throughout session on appropriate technique and form using multi-modal cueing, HEP, and activity modification. Patient articulated understanding and returned demonstration.  Patient Response to interventions: Patient notes improved gait  ASSESSMENT Patient presents to clinic with excellent motivation to participate in therapy. Patient demonstrates deficits in R hip mobility, IAP management, RLE strength, pain, gait, and posture. Patient able to achieve good form with deep core re-education activities during today's session and responded positively to active and manual interventions. Patient will benefit from continued skilled therapeutic intervention  to address remaining deficits in R hip mobility, IAP management, RLE strength, pain, gait, and posture in order to increase function and improve overall QOL.      PT Long Term Goals - 09/07/20 1419      PT LONG TERM GOAL #1   Title Patient will demonstrate independence with HEP in order to maximize therapeutic gains and improve carryover from physical therapy sessions to ADLs in the home and  community.    Baseline IE: not initiated    Time 8    Period Weeks    Status New    Target Date 11/02/20      PT LONG TERM GOAL #2   Title Patient will decrease worst R hip pain as reported on NPRS by at least 2 points to demonstrate clinically significant reduction in pain in order to restore/improve function and overall QOL.    Baseline IE: 9/10    Time 8    Period Weeks    Status New    Target Date 11/02/20      PT LONG TERM GOAL #3   Title Patient will demonstrate improved pain modulation and IAP management as evidenced by ability to practice diaphragmatic breathing with a 1:2 inhalation:exhalation ratio.    Baseline IE: not demonstrated (significant breath holding with guarding)    Time 8    Period Weeks    Status New    Target Date 11/02/20      PT LONG TERM GOAL #4   Title Patient will demonstrate improved function as evidenced by a score of 63 on FOTO measure for full participation in activities at home and in the community.    Baseline IE: 36    Time 8    Period Weeks    Status New    Target Date 11/02/20      PT LONG TERM GOAL #5   Title Patient will demonstrate improved R hip mobility as evidenced by R hip flexion > 115 degrees absent of pain for improved function and mobility.    Baseline IE: 100 degrees and painful    Time 8    Period Weeks    Status New    Target Date 11/02/20                 Plan - 10/19/20 1253    Clinical Impression Statement Patient presents to clinic with excellent motivation to participate in therapy. Patient demonstrates deficits in R hip mobility, IAP management, RLE strength, pain, gait, and posture. Patient able to achieve good form with deep core re-education activities during today's session and responded positively to active and manual interventions. Patient will benefit from continued skilled therapeutic intervention to address remaining deficits in R hip mobility, IAP management, RLE strength, pain, gait, and posture in  order to increase function and improve overall QOL.    Personal Factors and Comorbidities Age;Sex;Comorbidity 3+;Time since onset of injury/illness/exacerbation;Past/Current Experience    Comorbidities RA, anemia, HTN, hiatal hernia, sleep apnea    Examination-Activity Limitations Squat;Stairs;Lift;Sit;Locomotion Level;Stand    Examination-Participation Restrictions Church;Yard Work;Meal Prep    Stability/Clinical Decision Making Evolving/Moderate complexity    Rehab Potential Fair    PT Frequency 1x / week    PT Duration 8 weeks    PT Treatment/Interventions Cryotherapy;Electrical Stimulation;Moist Heat;Therapeutic activities;Therapeutic exercise;Neuromuscular re-education;Manual techniques;Patient/family education;Joint Manipulations;Spinal Manipulations;Dry needling;Passive range of motion;Taping;ADLs/Self Care Home Management    PT Next Visit Plan posterior thigh skin rolling, STM, deep core    PT Home Exercise Plan deep core UE  challenges    Consulted and Agree with Plan of Care Patient           Patient will benefit from skilled therapeutic intervention in order to improve the following deficits and impairments:  Abnormal gait, Decreased balance, Difficulty walking, Pain, Postural dysfunction, Improper body mechanics, Decreased activity tolerance, Decreased strength, Decreased range of motion, Hypomobility, Decreased mobility  Visit Diagnosis: Pain in right hip  Difficulty in walking, not elsewhere classified  Muscle weakness (generalized)     Problem List Patient Active Problem List   Diagnosis Date Noted  . Anemia 10/04/2018  . Essential hypertension 10/04/2018  . Rheumatoid arthritis with rheumatoid factor ([place]) 10/04/2018  . Vitamin D deficiency 10/04/2018  . Lung nodule seen on imaging study 08/21/2017  . Benign microscopic hematuria 02/06/2017  . Cardiac murmur 11/08/2016  . Dysphagia 10/11/2016  . Encounter for screening colonoscopy 10/11/2016  . Rectal bleeding  10/11/2016  . Ocular migraine 08/02/2016  . Recurrent major depressive disorder, in full remission ([place]) 01/27/2016  . Obstructive sleep apnea 09/15/2015   Myles Gip PT, DPT [phone]  10/19/2020, 12:58 PM  [place] [facility] [address] [place], [place], [address] Phone: [phone]   Fax:  [phone]  Name: MARGIE URBANOWICZ MRN: [id number] Date of Birth: [birth year]

## 2020-10-26 ENCOUNTER — Ambulatory Visit: Payer: BC Managed Care – PPO | Admitting: Physical Therapy

## 2020-10-26 ENCOUNTER — Other Ambulatory Visit: Payer: Self-pay

## 2020-10-26 ENCOUNTER — Encounter: Payer: Self-pay | Admitting: Physical Therapy

## 2020-10-26 DIAGNOSIS — R262 Difficulty in walking, not elsewhere classified: Secondary | ICD-10-CM

## 2020-10-26 DIAGNOSIS — M6281 Muscle weakness (generalized): Secondary | ICD-10-CM

## 2020-10-26 DIAGNOSIS — M25551 Pain in right hip: Secondary | ICD-10-CM | POA: Diagnosis not present

## 2020-10-26 NOTE — Therapy (Signed)
[facility] [address] [place], [place], [address] Phone: [phone]   Fax:  [phone]  Physical Therapy Treatment  Patient Details  Name: Janice Rowe MRN: [id number] Date of Birth: 02-09-1956 Referring Provider (PT): Denver Faster   Encounter Date: 10/26/2020   PT End of Session - 10/26/20 1113    Visit Number 8    Number of Visits 12    Date for PT Re-Evaluation 11/23/20    Authorization Type IE 09/07/2020    PT Start Time 1100    PT Stop Time 1155    PT Time Calculation (min) 55 min    Activity Tolerance Patient tolerated treatment well    Behavior During Therapy [facility] for tasks assessed/performed           Past Medical History:  Diagnosis Date   Anemia    Arthritis    Hiatal hernia    Hypertension    Sleep apnea     Past Surgical History:  Procedure Laterality Date   CARPAL TUNNEL RELEASE  2013   COLONOSCOPY  2007   COLONOSCOPY WITH PROPOFOL N/A 11/29/2016   Procedure: COLONOSCOPY WITH PROPOFOL;  Surgeon: Robert Bellow, MD;  Location: [facility] ENDOSCOPY;  Service: Endoscopy;  Laterality: N/A;   ESOPHAGOGASTRODUODENOSCOPY (EGD) WITH PROPOFOL N/A 11/29/2016   Procedure: ESOPHAGOGASTRODUODENOSCOPY (EGD) WITH PROPOFOL;  Surgeon: Robert Bellow, MD;  Location: [facility] ENDOSCOPY;  Service: Endoscopy;  Laterality: N/A;    There were no vitals filed for this visit.   Subjective Assessment - 10/26/20 1110    Subjective Patient presents to clinic with reports of excellent progress. Patient has been able to progress to 5 pound dumbbells for modified dead bug home exercise. Patient was also able to do some walking in town without pain/difficulty. She adds that she feels quite improved.    Currently in Pain? Yes    Pain Score 1     Pain Location Groin           TREATMENT  Manual Therapy: STM and TPR performed to R gluteal complex and R psoas to allow for decreased tension and pain and improved posture and  function with vibratory and percussive device  Neuromuscular Re-education: Sidelying diaphragmatic breathing with VCs and TCs for downregulation of the nervous system and improved management of IAP Sidelying R psoas stretch for improved hip extension and decreased anterior hip pain Deep core training for improved postural control:  TrA with coordinated breath and tactile feedbakc  TrA with coordinated breath and shoulder flexion, 5# dumbbell  TrA with coordinated breath and B shoulder flexion, 5# dumbbells  TrA with coordinated breath and B arm circles forward/backward, 5# dumbbells Standing postural control, heels elevated, hip ER, with arm circles forward/backward, 3# dumbbells   Patient educated throughout session on appropriate technique and form using multi-modal cueing, HEP, and activity modification. Patient articulated understanding and returned demonstration.  Patient Response to interventions: Denies increased pain.  ASSESSMENT Patient presents to clinic with excellent motivation to participate in therapy. Patient demonstrates deficits in R hip mobility, IAP management, RLE strength, pain, gait, and posture. Patient with excellent postural control and core stability with intermediate exercises during today's session and responded positively to active and manual interventions. Patient has made significant progress toward goals with several goals achieved including worst pain reduced by 7 points on NPRS. Patient's condition has the potential to improve in response to therapy. Maximum improvement is yet to be obtained. The anticipated improvement is attainable and reasonable  in a generally predictable time. Patient will benefit from continued skilled therapeutic intervention to address remaining deficits in R hip mobility, IAP management, RLE strength, pain, gait, and posture in order to increase function and improve overall QOL.      PT Long Term Goals - 10/26/20 1114      PT LONG  TERM GOAL #1   Title Patient will demonstrate independence with HEP in order to maximize therapeutic gains and improve carryover from physical therapy sessions to ADLs in the home and community.    Baseline IE: not initiated; 11/30: IND    Time 8    Period Weeks    Status Achieved      PT LONG TERM GOAL #2   Title Patient will decrease worst R hip pain as reported on NPRS by at least 2 points to demonstrate clinically significant reduction in pain in order to restore/improve function and overall QOL.    Baseline IE: 9/10; 11/30: 2/10 (when lifting)    Time 8    Period Weeks    Status Achieved      PT LONG TERM GOAL #3   Title Patient will demonstrate improved pain modulation and IAP management as evidenced by ability to practice diaphragmatic breathing with a 1:2 inhalation:exhalation ratio.    Baseline IE: not demonstrated (significant breath holding with guarding)    Time 8    Period Weeks    Status New      PT LONG TERM GOAL #4   Title Patient will demonstrate improved function as evidenced by a score of 63 on FOTO measure for full participation in activities at home and in the community.    Baseline IE: 47; 11/30:55    Time 8    Period Weeks    Status On-going    Target Date 11/23/20      PT LONG TERM GOAL #5   Title Patient will demonstrate improved R hip mobility as evidenced by R hip flexion > 115 degrees absent of pain for improved function and mobility.    Baseline IE: 100 degrees and painful; 11/30: 111 non painful    Time 8    Period Weeks    Status On-going    Target Date 11/23/20                 Plan - 10/26/20 1114    Clinical Impression Statement Patient presents to clinic with excellent motivation to participate in therapy. Patient demonstrates deficits in R hip mobility, IAP management, RLE strength, pain, gait, and posture. Patient with excellent postural control and core stability with intermediate exercises during today's session and responded  positively to active and manual interventions. Patient has made significant progress toward goals with several goals achieved including worst pain reduced by 7 points on NPRS. Patient's condition has the potential to improve in response to therapy. Maximum improvement is yet to be obtained. The anticipated improvement is attainable and reasonable in a generally predictable time. Patient will benefit from continued skilled therapeutic intervention to address remaining deficits in R hip mobility, IAP management, RLE strength, pain, gait, and posture in order to increase function and improve overall QOL.    Personal Factors and Comorbidities Age;Sex;Comorbidity 3+;Time since onset of injury/illness/exacerbation;Past/Current Experience    Comorbidities RA, anemia, HTN, hiatal hernia, sleep apnea    Examination-Activity Limitations Squat;Stairs;Lift;Sit;Locomotion Level;Stand    Examination-Participation Restrictions Church;Yard Work;Meal Prep    Stability/Clinical Decision Making Evolving/Moderate complexity    Rehab Potential Fair    PT Frequency  1x / week    PT Duration 4 weeks    PT Treatment/Interventions Cryotherapy;Electrical Stimulation;Moist Heat;Therapeutic activities;Therapeutic exercise;Neuromuscular re-education;Manual techniques;Patient/family education;Joint Manipulations;Spinal Manipulations;Dry needling;Passive range of motion;Taping;ADLs/Self Care Home Management    PT Next Visit Plan posterior thigh skin rolling, STM, deep core    PT Home Exercise Plan deep core UE challenges    Consulted and Agree with Plan of Care Patient           Patient will benefit from skilled therapeutic intervention in order to improve the following deficits and impairments:  Abnormal gait, Decreased balance, Difficulty walking, Pain, Postural dysfunction, Improper body mechanics, Decreased activity tolerance, Decreased strength, Decreased range of motion, Hypomobility, Decreased mobility  Visit  Diagnosis: Pain in right hip - Plan: PT plan of care cert/re-cert  Difficulty in walking, not elsewhere classified - Plan: PT plan of care cert/re-cert  Muscle weakness (generalized) - Plan: PT plan of care cert/re-cert     Problem List Patient Active Problem List   Diagnosis Date Noted   Anemia 10/04/2018   Essential hypertension 10/04/2018   Rheumatoid arthritis with rheumatoid factor ([place]) 10/04/2018   Vitamin D deficiency 10/04/2018   Lung nodule seen on imaging study 08/21/2017   Benign microscopic hematuria 02/06/2017   Cardiac murmur 11/08/2016   Dysphagia 10/11/2016   Encounter for screening colonoscopy 10/11/2016   Rectal bleeding 10/11/2016   Ocular migraine 08/02/2016   Recurrent major depressive disorder, in full remission ([place]) 01/27/2016   Obstructive sleep apnea 09/15/2015   Myles Gip PT, DPT [phone]  10/26/2020, 1:07 PM  [facility] [address] [place], [place], [address] Phone: [phone]   Fax:  [phone]  Name: Janice Rowe MRN: [id number] Date of Birth: 12-Nov-1956

## 2020-11-01 ENCOUNTER — Encounter: Payer: Self-pay | Admitting: Physical Therapy

## 2020-11-01 ENCOUNTER — Ambulatory Visit: Payer: BC Managed Care – PPO | Attending: Obstetrics and Gynecology | Admitting: Physical Therapy

## 2020-11-01 ENCOUNTER — Other Ambulatory Visit: Payer: Self-pay

## 2020-11-01 DIAGNOSIS — M25551 Pain in right hip: Secondary | ICD-10-CM | POA: Insufficient documentation

## 2020-11-01 DIAGNOSIS — R262 Difficulty in walking, not elsewhere classified: Secondary | ICD-10-CM | POA: Diagnosis present

## 2020-11-01 DIAGNOSIS — M6281 Muscle weakness (generalized): Secondary | ICD-10-CM | POA: Insufficient documentation

## 2020-11-01 NOTE — Therapy (Signed)
[facility] [address] [place], [place], [address] Phone: [phone]   Fax:  [phone]  Physical Therapy Treatment  Patient Details  Name: Janice Rowe MRN: [id number] Date of Birth: [birth year] Referring Provider (PT): Denver Faster   Encounter Date: 11/01/2020   PT End of Session - 11/01/20 1406    Visit Number 9    Number of Visits 12    Date for PT Re-Evaluation 11/23/20    Authorization Type IE 09/07/2020    PT Start Time 1400    PT Stop Time 1455    PT Time Calculation (min) 55 min    Activity Tolerance Patient tolerated treatment well    Behavior During Therapy [facility] for tasks assessed/performed           Past Medical History:  Diagnosis Date  . Anemia   . Arthritis   . Hiatal hernia   . Hypertension   . Sleep apnea     Past Surgical History:  Procedure Laterality Date  . CARPAL TUNNEL RELEASE  2013  . COLONOSCOPY  2007  . COLONOSCOPY WITH PROPOFOL N/A 11/29/2016   Procedure: COLONOSCOPY WITH PROPOFOL;  Surgeon: Robert Bellow, MD;  Location: [facility] ENDOSCOPY;  Service: Endoscopy;  Laterality: N/A;  . ESOPHAGOGASTRODUODENOSCOPY (EGD) WITH PROPOFOL N/A 11/29/2016   Procedure: ESOPHAGOGASTRODUODENOSCOPY (EGD) WITH PROPOFOL;  Surgeon: Robert Bellow, MD;  Location: [facility] ENDOSCOPY;  Service: Endoscopy;  Laterality: N/A;    There were no vitals filed for this visit.   Subjective Assessment - 11/01/20 1357    Subjective Patient notes that she has increased RLE pain and weakness and that started Thursday of last week. She had inability to lift RLE predominantly limited by pain but notes some mild weakness. Patient started a prednisone strip to help manage inflammation. Patient notes this AM pain was 7/10, and over the weekend she was 9/10.    Currently in Pain? Yes    Pain Score 2     Pain Location Leg    Pain Orientation Right           TREATMENT  Manual Therapy: STM and TPR performed to R gluteal  complex and R hamstring to allow for decreased tension and pain and improved posture and function with vibratory and percussive device R posterior innominate mobilizations for improved gapping and neural mobility R lumbar lateral glides for improved mobility and decreased pain  Neuromuscular Re-education: Sidelying diaphragmatic breathing with VCs and TCs for downregulation of the nervous system and improved management of IAP Supine figure 4 stretch for improved pain modulation and decreased sciatic nerve compression on R Supine TrA activation with coordinated breath and tactile feedback for decreased rectus dominance Patient education on morning routine for improved pain modulation and joint mobility in the morning including:  Open book  Pinwheel arms  Supine pelvic tilts  Supine pelvic circles  Seated pelvic tilts  Seated thoracic extension   Patient educated throughout session on appropriate technique and form using multi-modal cueing, HEP, and activity modification. Patient articulated understanding and returned demonstration.  Patient Response to interventions: Patient 1.5/10 pain but does feel she is standing straighter  ASSESSMENT Patient presents to clinic with increased pain and difficulty walking. Patient demonstrates deficits in R hip mobility, IAP management, RLE strength, pain, gait, and posture. Patient benefiting minimally from manual interventions during today's session and responded positively to education on morning routine for decreased pain and stiffness on waking. Patient will benefit from continued skilled therapeutic intervention  to address remaining deficits in R hip mobility, IAP management, RLE strength, pain, gait, and posture in order to increase function and improve overall QOL.      PT Long Term Goals - 10/26/20 1114      PT LONG TERM GOAL #1   Title Patient will demonstrate independence with HEP in order to maximize therapeutic gains and improve carryover  from physical therapy sessions to ADLs in the home and community.    Baseline IE: not initiated; 11/30: IND    Time 8    Period Weeks    Status Achieved      PT LONG TERM GOAL #2   Title Patient will decrease worst R hip pain as reported on NPRS by at least 2 points to demonstrate clinically significant reduction in pain in order to restore/improve function and overall QOL.    Baseline IE: 9/10; 11/30: 2/10 (when lifting)    Time 8    Period Weeks    Status Achieved      PT LONG TERM GOAL #3   Title Patient will demonstrate improved pain modulation and IAP management as evidenced by ability to practice diaphragmatic breathing with a 1:2 inhalation:exhalation ratio.    Baseline IE: not demonstrated (significant breath holding with guarding)    Time 8    Period Weeks    Status New      PT LONG TERM GOAL #4   Title Patient will demonstrate improved function as evidenced by a score of 63 on FOTO measure for full participation in activities at home and in the community.    Baseline IE: 47; 11/30:55    Time 8    Period Weeks    Status On-going    Target Date 11/23/20      PT LONG TERM GOAL #5   Title Patient will demonstrate improved R hip mobility as evidenced by R hip flexion > 115 degrees absent of pain for improved function and mobility.    Baseline IE: 100 degrees and painful; 11/30: 111 non painful    Time 8    Period Weeks    Status On-going    Target Date 11/23/20                 Plan - 11/01/20 1720    Clinical Impression Statement Patient presents to clinic with increased pain and difficulty walking. Patient demonstrates deficits in R hip mobility, IAP management, RLE strength, pain, gait, and posture. Patient benefiting minimally from manual interventions during today's session and responded positively to education on morning routine for decreased pain and stiffness on waking. Patient will benefit from continued skilled therapeutic intervention to address remaining  deficits in R hip mobility, IAP management, RLE strength, pain, gait, and posture in order to increase function and improve overall QOL.    Personal Factors and Comorbidities Age;Sex;Comorbidity 3+;Time since onset of injury/illness/exacerbation;Past/Current Experience    Comorbidities RA, anemia, HTN, hiatal hernia, sleep apnea    Examination-Activity Limitations Squat;Stairs;Lift;Sit;Locomotion Level;Stand    Examination-Participation Restrictions Church;Yard Work;Meal Prep    Stability/Clinical Decision Making Evolving/Moderate complexity    Rehab Potential Fair    PT Frequency 1x / week    PT Duration 4 weeks    PT Treatment/Interventions Cryotherapy;Electrical Stimulation;Moist Heat;Therapeutic activities;Therapeutic exercise;Neuromuscular re-education;Manual techniques;Patient/family education;Joint Manipulations;Spinal Manipulations;Dry needling;Passive range of motion;Taping;ADLs/Self Care Home Management    PT Next Visit Plan posterior thigh skin rolling, STM, deep core    PT Home Exercise Plan deep core UE challenges    Consulted and  Agree with Plan of Care Patient           Patient will benefit from skilled therapeutic intervention in order to improve the following deficits and impairments:  Abnormal gait, Decreased balance, Difficulty walking, Pain, Postural dysfunction, Improper body mechanics, Decreased activity tolerance, Decreased strength, Decreased range of motion, Hypomobility, Decreased mobility  Visit Diagnosis: Pain in right hip  Difficulty in walking, not elsewhere classified  Muscle weakness (generalized)     Problem List Patient Active Problem List   Diagnosis Date Noted  . Anemia 10/04/2018  . Essential hypertension 10/04/2018  . Rheumatoid arthritis with rheumatoid factor ([place]) 10/04/2018  . Vitamin D deficiency 10/04/2018  . Lung nodule seen on imaging study 08/21/2017  . Benign microscopic hematuria 02/06/2017  . Cardiac murmur 11/08/2016  .  Dysphagia 10/11/2016  . Encounter for screening colonoscopy 10/11/2016  . Rectal bleeding 10/11/2016  . Ocular migraine 08/02/2016  . Recurrent major depressive disorder, in full remission ([place]) 01/27/2016  . Obstructive sleep apnea 09/15/2015   Myles Gip PT, DPT [phone]  11/01/2020, 5:26 PM   [facility] [address] [place], [place], [address] Phone: [phone]   Fax:  [phone]  Name: Janice Rowe MRN: [id number] Date of Birth: 1956-02-21

## 2020-11-02 ENCOUNTER — Ambulatory Visit: Payer: BC Managed Care – PPO | Admitting: Physical Therapy

## 2020-11-10 ENCOUNTER — Ambulatory Visit: Payer: BC Managed Care – PPO | Admitting: Physical Therapy

## 2020-11-17 ENCOUNTER — Ambulatory Visit: Payer: BC Managed Care – PPO | Admitting: Physical Therapy

## 2020-11-22 ENCOUNTER — Ambulatory Visit: Payer: BC Managed Care – PPO | Admitting: Physical Therapy

## 2020-11-22 ENCOUNTER — Encounter: Payer: Self-pay | Admitting: Physical Therapy

## 2020-11-22 ENCOUNTER — Other Ambulatory Visit: Payer: Self-pay

## 2020-11-22 DIAGNOSIS — R262 Difficulty in walking, not elsewhere classified: Secondary | ICD-10-CM

## 2020-11-22 DIAGNOSIS — M25551 Pain in right hip: Secondary | ICD-10-CM

## 2020-11-22 DIAGNOSIS — M6281 Muscle weakness (generalized): Secondary | ICD-10-CM

## 2020-11-22 NOTE — Therapy (Signed)
[facility] [address] [place], [place], [address] Phone: [phone]   Fax:  [phone]  Physical Therapy Treatment  Patient Details  Name: Janice Rowe MRN: [id number] Date of Birth: 04/02/1956 Referring Provider (PT): Denver Faster   Encounter Date: 11/22/2020   PT End of Session - 11/22/20 1048    Visit Number 10    Number of Visits 12    Date for PT Re-Evaluation 11/23/20    Authorization Type IE 09/07/2020    PT Start Time 1040    PT Stop Time 1135    PT Time Calculation (min) 55 min    Activity Tolerance Patient tolerated treatment well    Behavior During Therapy [facility] for tasks assessed/performed           Past Medical History:  Diagnosis Date   Anemia    Arthritis    Hiatal hernia    Hypertension    Sleep apnea     Past Surgical History:  Procedure Laterality Date   CARPAL TUNNEL RELEASE  2013   COLONOSCOPY  2007   COLONOSCOPY WITH PROPOFOL N/A 11/29/2016   Procedure: COLONOSCOPY WITH PROPOFOL;  Surgeon: Robert Bellow, MD;  Location: [facility] ENDOSCOPY;  Service: Endoscopy;  Laterality: N/A;   ESOPHAGOGASTRODUODENOSCOPY (EGD) WITH PROPOFOL N/A 11/29/2016   Procedure: ESOPHAGOGASTRODUODENOSCOPY (EGD) WITH PROPOFOL;  Surgeon: Robert Bellow, MD;  Location: [facility] ENDOSCOPY;  Service: Endoscopy;  Laterality: N/A;    There were no vitals filed for this visit.   Subjective Assessment - 11/22/20 1044    Subjective Patient notes that she is still recovering from bronchitis and feels fatigued. Patient has not been able to do exercises because of energy levels. She notes that this morning she could tell her R hip ROM was decreased from her lack of activity.    Currently in Pain? Yes    Pain Score 3     Pain Location Hip    Pain Orientation Right           TREATMENT  Manual Therapy: STM and TPR performed to R gluteal complex and R hamstring to allow for decreased tension and pain and improved posture  and function with vibratory and percussive device R posterior innominate mobilizations for improved gapping and neural mobility  Neuromuscular Re-education: Sidelying diaphragmatic breathing with VCs and TCs for downregulation of the nervous system and improved management of IAP Sidelying ITB and distal hamstring stretch with PT assistance for pain modulation and improved neural mobility Rotary Hamstring Stretch on Step in Doorway for pain modulation and improved neural mobility Seated Hamstring Stretch for pain modulation and improved neural mobility Standing Hamstring Stretch with Step for pain modulation and improved neural mobility  Patient educated throughout session on appropriate technique and form using multi-modal cueing, HEP, and activity modification. Patient articulated understanding and returned demonstration.  Patient Response to interventions: Patient with more R hip extension and erect posture.  ASSESSMENT Patient presents to clinic with increased pain and difficulty walking. Patient demonstrates deficits in R hip mobility, IAP management, RLE strength, pain, gait, and posture. Patient with noticeable improvement in RLE WB and overall posture during today's session and responded positively to manual and active interventions. Patient will benefit from continued skilled therapeutic intervention to address remaining deficits in R hip mobility, IAP management, RLE strength, pain, gait, and posture in order to increase function and improve overall QOL.     PT Long Term Goals - 11/22/20 1049  PT LONG TERM GOAL #1   Title Patient will demonstrate independence with HEP in order to maximize therapeutic gains and improve carryover from physical therapy sessions to ADLs in the home and community.    Baseline IE: not initiated; 11/30: IND    Time 8    Period Weeks    Status Achieved      PT LONG TERM GOAL #2   Title Patient will decrease worst R hip pain as reported on NPRS by  at least 2 points to demonstrate clinically significant reduction in pain in order to restore/improve function and overall QOL.    Baseline IE: 9/10; 11/30: 2/10 (when lifting)    Time 8    Period Weeks    Status Achieved      PT LONG TERM GOAL #3   Title Patient will demonstrate improved pain modulation and IAP management as evidenced by ability to practice diaphragmatic breathing with a 1:2 inhalation:exhalation ratio.    Baseline IE: not demonstrated (significant breath holding with guarding)    Time 8    Period Weeks    Status On-going      PT LONG TERM GOAL #4   Title Patient will demonstrate improved function as evidenced by a score of 63 on FOTO measure for full participation in activities at home and in the community.    Baseline IE: 47; 11/30:55    Time 8    Period Weeks    Status On-going      PT LONG TERM GOAL #5   Title Patient will demonstrate improved R hip mobility as evidenced by R hip flexion > 115 degrees absent of pain for improved function and mobility.    Baseline IE: 100 degrees and painful; 11/30: 111 non painful; 12/27:    Time 8    Period Weeks    Status On-going                 Plan - 11/22/20 1147    Clinical Impression Statement Patient presents to clinic with increased pain and difficulty walking. Patient demonstrates deficits in R hip mobility, IAP management, RLE strength, pain, gait, and posture. Patient with noticeable improvement in RLE WB and overall posture during today's session and responded positively to manual and active interventions. Patient will benefit from continued skilled therapeutic intervention to address remaining deficits in R hip mobility, IAP management, RLE strength, pain, gait, and posture in order to increase function and improve overall QOL.    Personal Factors and Comorbidities Age;Sex;Comorbidity 3+;Time since onset of injury/illness/exacerbation;Past/Current Experience    Comorbidities RA, anemia, HTN, hiatal hernia,  sleep apnea    Examination-Activity Limitations Squat;Stairs;Lift;Sit;Locomotion Level;Stand    Examination-Participation Restrictions Church;Yard Work;Meal Prep    Stability/Clinical Decision Making Evolving/Moderate complexity    Rehab Potential Fair    PT Frequency 1x / week    PT Duration 4 weeks    PT Treatment/Interventions Cryotherapy;Electrical Stimulation;Moist Heat;Therapeutic activities;Therapeutic exercise;Neuromuscular re-education;Manual techniques;Patient/family education;Joint Manipulations;Spinal Manipulations;Dry needling;Passive range of motion;Taping;ADLs/Self Care Home Management    PT Next Visit Plan posterior thigh skin rolling, STM, deep core    PT Home Exercise Plan deep core UE challenges    Consulted and Agree with Plan of Care Patient           Patient will benefit from skilled therapeutic intervention in order to improve the following deficits and impairments:  Abnormal gait,Decreased balance,Difficulty walking,Pain,Postural dysfunction,Improper body mechanics,Decreased activity tolerance,Decreased strength,Decreased range of motion,Hypomobility,Decreased mobility  Visit Diagnosis: Pain in right hip  Difficulty  in walking, not elsewhere classified  Muscle weakness (generalized)     Problem List Patient Active Problem List   Diagnosis Date Noted   Anemia 10/04/2018   Essential hypertension 10/04/2018   Rheumatoid arthritis with rheumatoid factor ([place]) 10/04/2018   Vitamin D deficiency 10/04/2018   Lung nodule seen on imaging study 08/21/2017   Benign microscopic hematuria 02/06/2017   Cardiac murmur 11/08/2016   Dysphagia 10/11/2016   Encounter for screening colonoscopy 10/11/2016   Rectal bleeding 10/11/2016   Ocular migraine 08/02/2016   Recurrent major depressive disorder, in full remission ([place]) 01/27/2016   Obstructive sleep apnea 09/15/2015   Myles Gip PT, DPT [phone]  11/22/2020, 11:48 AM  [place] [facility] [address] [place], [place], [address] Phone: [phone]   Fax:  [phone]  Name: Janice Rowe MRN: [id number] Date of Birth: Apr 10, 1956

## 2020-11-30 ENCOUNTER — Other Ambulatory Visit: Payer: Self-pay

## 2020-11-30 ENCOUNTER — Encounter: Payer: Self-pay | Admitting: Physical Therapy

## 2020-11-30 ENCOUNTER — Ambulatory Visit: Payer: BC Managed Care – PPO | Attending: Obstetrics and Gynecology | Admitting: Physical Therapy

## 2020-11-30 DIAGNOSIS — M6281 Muscle weakness (generalized): Secondary | ICD-10-CM | POA: Diagnosis present

## 2020-11-30 DIAGNOSIS — M25551 Pain in right hip: Secondary | ICD-10-CM | POA: Diagnosis present

## 2020-11-30 DIAGNOSIS — R262 Difficulty in walking, not elsewhere classified: Secondary | ICD-10-CM | POA: Diagnosis present

## 2020-11-30 NOTE — Therapy (Signed)
[place] [facility] [address] [place], [place], [address] Phone: [phone]   Fax:  [phone]  Physical Therapy Treatment  Patient Details  Name: Janice Rowe MRN: [id number] Date of Birth: Apr 19, 1956 Referring Provider (PT): Denver Faster   Encounter Date: 11/30/2020   PT End of Session - 11/30/20 1100    Visit Number 11    Number of Visits 18    Date for PT Re-Evaluation 01/11/21    Authorization Type IE 09/07/2020    PT Start Time 1050    PT Stop Time 1145    PT Time Calculation (min) 55 min    Activity Tolerance Patient tolerated treatment well    Behavior During Therapy [facility] for tasks assessed/performed           Past Medical History:  Diagnosis Date  . Anemia   . Arthritis   . Hiatal hernia   . Hypertension   . Sleep apnea     Past Surgical History:  Procedure Laterality Date  . CARPAL TUNNEL RELEASE  2013  . COLONOSCOPY  2007  . COLONOSCOPY WITH PROPOFOL N/A 11/29/2016   Procedure: COLONOSCOPY WITH PROPOFOL;  Surgeon: Robert Bellow, MD;  Location: [facility] ENDOSCOPY;  Service: Endoscopy;  Laterality: N/A;  . ESOPHAGOGASTRODUODENOSCOPY (EGD) WITH PROPOFOL N/A 11/29/2016   Procedure: ESOPHAGOGASTRODUODENOSCOPY (EGD) WITH PROPOFOL;  Surgeon: Robert Bellow, MD;  Location: [facility] ENDOSCOPY;  Service: Endoscopy;  Laterality: N/A;    There were no vitals filed for this visit.   Subjective Assessment - 11/30/20 1055    Subjective Patient reports that this is the best day she has had. She states that her RLQ pain has resolved completely. She reports that she continues to be frustrated by the RLE pain. Patient has had multiple episodes/day of her RLE giving way/buckling 2/2 to weakness.    Currently in Pain? Yes    Pain Score 4     Pain Location Leg    Pain Orientation Right           TREATMENT  Manual Therapy: STM and TPR performed to R gluteal complex and R hamstring and R calf to allow for decreased tension and  pain and improved posture and function with vibratory and percussive device R posterior innominate mobilizations for improved gapping and neural mobility R LAD for improved R hip mobility and pain modulation  Neuromuscular Re-education: Sidelying diaphragmatic breathing with VCs and TCs for downregulation of the nervous system and improved management of IAP Nu-Step, seat 12, L1, x5 min for graded activity in the management of persistent pain state Patient education on continued use of HEP to manage pain in RLE and preserve progress in RLQ.  Patient educated throughout session on appropriate technique and form using multi-modal cueing, HEP, and activity modification. Patient articulated understanding and returned demonstration.  Patient Response to interventions: Comfortable to follow-up after 1/22 appointment with Dr. Sharlet Salina  ASSESSMENT Patient presents to clinic with increased pain and difficulty walking but notes complete resolution of RLQ pain. Patient demonstrates deficits in R hip mobility, RLE  pain, gait, and posture. 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 Patient advised to use [facility] for improved gait and safety as well as to decrease overall load on BLE. Patient has made significant progress toward goals or achieved them;  however her function has not been fully restored 2/2 to RLE pain which is likely related to structural changes in the spine. Patient feels comfortable to self-manage with current HEP and pain coping skills until follow-up with Dr. Yves Dill on January 22. Patient may benefit from continued skilled therapeutic intervention to address deficits in R hip mobility, RLE pain, gait, and posture in order to increase function and improve overall QOL.    PT Long Term  Goals - 11/30/20 1336      PT LONG TERM GOAL #1   Title Patient will demonstrate independence with HEP in order to maximize therapeutic gains and improve carryover from physical therapy sessions to ADLs in the home and community.    Baseline IE: not initiated; 11/30: IND    Time 8    Period Weeks    Status Achieved      PT LONG TERM GOAL #2   Title Patient will decrease worst R hip pain as reported on NPRS by at least 2 points to demonstrate clinically significant reduction in pain in order to restore/improve function and overall QOL.    Baseline IE: 9/10; 11/30: 2/10 (when lifting)    Time 8    Period Weeks    Status Achieved      PT LONG TERM GOAL #3   Title Patient will demonstrate improved pain modulation and IAP management as evidenced by ability to practice diaphragmatic breathing with a 1:2 inhalation:exhalation ratio.    Baseline IE: not demonstrated (significant breath holding with guarding); 12/27: IND    Time 8    Period Weeks    Status Achieved      PT LONG TERM GOAL #4   Title Patient will demonstrate improved function as evidenced by a score of 63 on FOTO measure for full participation in activities at home and in the community.    Baseline IE: 47; 11/30:55; 12/27: 50    Time 6    Period Weeks    Status On-going    Target Date 01/11/21      PT LONG TERM GOAL #5   Title Patient will demonstrate improved R hip mobility as evidenced by R hip flexion > 115 degrees absent of pain for improved function and mobility.    Baseline IE: 100 degrees and painful; 11/30: 111 non painful; 12/27: 106 painful    Time 6    Period Weeks    Status On-going    Target Date 01/11/21                 Plan - 11/30/20 1320    Clinical Impression Statement Patient presents to clinic with increased pain and difficulty walking but notes complete resolution of RLQ pain. Patient demonstrates deficits in R hip mobility, RLE  pain, gait, and posture. 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 Patient advised to use [facility] for improved gait and safety as well as to decrease overall load on BLE. Patient has made significant progress toward goals or achieved them; however her function has not been fully restored 2/2 to RLE pain which is likely related to structural changes in the spine. Patient feels comfortable to self-manage with current HEP and pain coping skills until follow-up with Dr. Yves Dill on January 22. Patient may benefit from continued skilled therapeutic intervention to address deficits  in R hip mobility, RLE pain, gait, and posture in order to increase function and improve overall QOL.    Personal Factors and Comorbidities Age;Sex;Comorbidity 3+;Time since onset of injury/illness/exacerbation;Past/Current Experience    Comorbidities RA, anemia, HTN, hiatal hernia, sleep apnea    Examination-Activity Limitations Squat;Stairs;Lift;Sit;Locomotion Level;Stand    Examination-Participation Restrictions Church;Yard Work;Meal Prep    Stability/Clinical Decision Making Evolving/Moderate complexity    Rehab Potential Fair    PT Frequency 1x / week    PT Duration 6 weeks    PT Treatment/Interventions Cryotherapy;Electrical Stimulation;Moist Heat;Therapeutic activities;Therapeutic exercise;Neuromuscular re-education;Manual techniques;Patient/family education;Joint Manipulations;Spinal Manipulations;Dry needling;Passive range of motion;Taping;ADLs/Self Care Home Management    PT Next Visit Plan --    PT Home Exercise Plan --    Consulted and Agree with Plan of Care Patient           Patient will benefit from skilled therapeutic intervention in order to improve the following deficits and impairments:  Abnormal gait,Decreased balance,Difficulty walking,Pain,Postural  dysfunction,Improper body mechanics,Decreased activity tolerance,Decreased strength,Decreased range of motion,Hypomobility,Decreased mobility  Visit Diagnosis: Pain in right hip  Difficulty in walking, not elsewhere classified  Muscle weakness (generalized)     Problem List Patient Active Problem List   Diagnosis Date Noted  . Anemia 10/04/2018  . Essential hypertension 10/04/2018  . Rheumatoid arthritis with rheumatoid factor ([place]) 10/04/2018  . Vitamin D deficiency 10/04/2018  . Lung nodule seen on imaging study 08/21/2017  . Benign microscopic hematuria 02/06/2017  . Cardiac murmur 11/08/2016  . Dysphagia 10/11/2016  . Encounter for screening colonoscopy 10/11/2016  . Rectal bleeding 10/11/2016  . Ocular migraine 08/02/2016  . Recurrent major depressive disorder, in full remission ([place]) 01/27/2016  . Obstructive sleep apnea 09/15/2015   Myles Gip PT, DPT [phone]  11/30/2020, 1:37 PM  [place] [facility] [address] [place], [place], [address] Phone: [phone]   Fax:  [phone]  Name: Janice Rowe MRN: [id number] Date of Birth: 08-07-1956

## 2020-12-14 ENCOUNTER — Encounter: Payer: BC Managed Care – PPO | Admitting: Physical Therapy

## 2021-01-21 ENCOUNTER — Other Ambulatory Visit: Payer: Self-pay | Admitting: Orthopedic Surgery

## 2021-02-11 ENCOUNTER — Other Ambulatory Visit: Payer: Self-pay

## 2021-02-11 ENCOUNTER — Encounter
Admission: RE | Admit: 2021-02-11 | Discharge: 2021-02-11 | Disposition: A | Discharge status: Home / Self Care | Payer: BC Managed Care – PPO | Source: Ambulatory Visit | Attending: Orthopedic Surgery | Admitting: Orthopedic Surgery

## 2021-02-11 DIAGNOSIS — Z01818 Encounter for other preprocedural examination: Secondary | ICD-10-CM | POA: Diagnosis not present

## 2021-02-11 HISTORY — DX: Gastro-esophageal reflux disease without esophagitis: K21.9

## 2021-02-11 HISTORY — DX: Vitamin D deficiency, unspecified: E55.9

## 2021-02-11 HISTORY — DX: Rheumatoid arthritis, unspecified: M06.9

## 2021-02-11 LAB — SURGICAL PCR SCREEN
MRSA, PCR: NEGATIVE
Staphylococcus aureus: NEGATIVE

## 2021-02-11 LAB — TYPE AND SCREEN
ABO/RH(D): O NEG
Antibody Screen: NEGATIVE

## 2021-02-11 NOTE — Patient Instructions (Signed)
Your procedure is scheduled on: Friday, March 25 Report to the Registration Desk on the 1st floor of the [facility]. To find out your arrival time, please call [phone] between 1PM - 3PM on: Thursday, March 24  REMEMBER: Instructions that are not followed completely may result in serious medical risk, up to and including death; or upon the discretion of your surgeon and anesthesiologist your surgery may need to be rescheduled.  Do not eat food after midnight the night before surgery.  No gum chewing, lozengers or hard candies.  You may however, drink CLEAR liquids up to 2 hours before you are scheduled to arrive for your surgery. Do not drink anything within 2 hours of your scheduled arrival time.  Clear liquids include: - water  - apple juice without pulp - gatorade (not RED, PURPLE, OR BLUE) - black coffee or tea (Do NOT add milk or creamers to the coffee or tea) Do NOT drink anything that is not on this list.  In addition, your doctor has ordered for you to drink the provided  Ensure Pre-Surgery Clear Carbohydrate Drink  Drinking this carbohydrate drink up to two hours before surgery helps to reduce insulin resistance and improve patient outcomes. Please complete drinking 2 hours prior to scheduled arrival time.  TAKE THESE MEDICATIONS THE MORNING OF SURGERY WITH A SIP OF WATER:  1.  Gabapentin 2.  Metoprolol 3.  Omeprazole - (take one the night before and one on the morning of surgery - helps to prevent nausea after surgery.) 4.  Venlafaxine 5.  Tylenol or tramadol if needed for pain  One week prior to surgery: today, March 18 Stop Anti-inflammatories (NSAIDS) such as Advil, Aleve, Ibuprofen, Motrin, Naproxen, Naprosyn and Aspirin based products such as Excedrin, Goodys Powder, BC Powder. Stop ANY OVER THE COUNTER supplements until after surgery.  No Alcohol for 24 hours before or after surgery.  On the morning of surgery brush your teeth with toothpaste and water,  you may rinse your mouth with mouthwash if you wish. Do not swallow any toothpaste or mouthwash.  Do not wear jewelry, make-up, hairpins, clips or nail polish.  Do not wear lotions, powders, or perfumes.   Do not shave body from the neck down 48 hours prior to surgery just in case you cut yourself which could leave a site for infection.  Also, freshly shaved skin may become irritated if using the CHG soap.  Contact lenses, hearing aids and dentures may not be worn into surgery.  Do not bring valuables to the hospital. [facility] is not responsible for any missing/lost belongings or valuables.   Use CHG Soap as directed on instruction sheet.  Bring your C-PAP to the hospital with you in case you may have to spend the night.   Notify your doctor if there is any change in your medical condition (cold, fever, infection).  Wear comfortable clothing (specific to your surgery type) to the hospital.  Plan for stool softeners for home use; pain medications have a tendency to cause constipation. You can also help prevent constipation by eating foods high in fiber such as fruits and vegetables and drinking plenty of fluids as your diet allows.  After surgery, you can help prevent lung complications by doing breathing exercises.  Take deep breaths and cough every 1-2 hours. Your doctor may order a device called an Incentive Spirometer to help you take deep breaths.  If you are being admitted to the hospital overnight, leave your suitcase in the car.  After surgery it may be brought to your room.  If you are being discharged the day of surgery, you will not be allowed to drive home. You will need a responsible adult (18 years or older) to drive you home and stay with you that night.   If you are taking public transportation, you will need to have a responsible adult (18 years or older) with you. Please confirm with your physician that it is acceptable to use public transportation.   Please  call the [facility]. at [phone] if you have any questions about these instructions.  Surgery Visitation Policy:  Patients undergoing a surgery or procedure may have one family member or support person with them as long as that person is not COVID-19 positive or experiencing its symptoms.  That person may remain in the waiting area during the procedure.  Inpatient Visitation:    Visiting hours are 7 a.m. to 8 p.m. Inpatients will be allowed two visitors daily. The visitors may change each day during the patient's stay. No visitors under the age of 25. Any visitor under the age of 10 must be accompanied by an adult. The visitor must pass COVID-19 screenings, use hand sanitizer when entering and exiting the patient's room and wear a mask at all times, including in the patient's room. Patients must also wear a mask when staff or their visitor are in the room. Masking is required regardless of vaccination status.

## 2021-02-16 ENCOUNTER — Other Ambulatory Visit: Payer: Self-pay

## 2021-02-16 ENCOUNTER — Other Ambulatory Visit
Admission: RE | Admit: 2021-02-16 | Discharge: 2021-02-16 | Disposition: A | Discharge status: Home / Self Care | Payer: BC Managed Care – PPO | Source: Ambulatory Visit | Attending: Orthopedic Surgery | Admitting: Orthopedic Surgery

## 2021-02-16 DIAGNOSIS — Z01812 Encounter for preprocedural laboratory examination: Secondary | ICD-10-CM | POA: Insufficient documentation

## 2021-02-16 DIAGNOSIS — Z20822 Contact with and (suspected) exposure to covid-19: Secondary | ICD-10-CM | POA: Insufficient documentation

## 2021-02-16 LAB — SARS CORONAVIRUS 2 (TAT 6-24 HRS): SARS Coronavirus 2: NEGATIVE

## 2021-02-18 ENCOUNTER — Observation Stay: Payer: BC Managed Care – PPO

## 2021-02-18 ENCOUNTER — Inpatient Hospital Stay
Admission: RE | Admit: 2021-02-18 | Discharge: 2021-02-19 | DRG: 470 | Disposition: A | Discharge status: Home Health | Payer: BC Managed Care – PPO | Attending: Orthopedic Surgery | Admitting: Orthopedic Surgery

## 2021-02-18 ENCOUNTER — Inpatient Hospital Stay: Payer: BC Managed Care – PPO

## 2021-02-18 ENCOUNTER — Other Ambulatory Visit: Payer: Self-pay

## 2021-02-18 ENCOUNTER — Encounter
Admission: RE | Disposition: A | Discharge status: Home / Self Care | Payer: Self-pay | Source: Home / Self Care | Attending: Orthopedic Surgery

## 2021-02-18 ENCOUNTER — Encounter: Payer: Self-pay | Admitting: Orthopedic Surgery

## 2021-02-18 ENCOUNTER — Inpatient Hospital Stay: Payer: BC Managed Care – PPO | Admitting: Urgent Care

## 2021-02-18 DIAGNOSIS — Z79891 Long term (current) use of opiate analgesic: Secondary | ICD-10-CM | POA: Diagnosis not present

## 2021-02-18 DIAGNOSIS — M1611 Unilateral primary osteoarthritis, right hip: Principal | ICD-10-CM | POA: Diagnosis present

## 2021-02-18 DIAGNOSIS — G4733 Obstructive sleep apnea (adult) (pediatric): Secondary | ICD-10-CM | POA: Diagnosis present

## 2021-02-18 DIAGNOSIS — Z7952 Long term (current) use of systemic steroids: Secondary | ICD-10-CM | POA: Diagnosis not present

## 2021-02-18 DIAGNOSIS — Z79899 Other long term (current) drug therapy: Secondary | ICD-10-CM

## 2021-02-18 DIAGNOSIS — M059 Rheumatoid arthritis with rheumatoid factor, unspecified: Secondary | ICD-10-CM | POA: Diagnosis present

## 2021-02-18 DIAGNOSIS — Z87891 Personal history of nicotine dependence: Secondary | ICD-10-CM | POA: Diagnosis not present

## 2021-02-18 DIAGNOSIS — Z20822 Contact with and (suspected) exposure to covid-19: Secondary | ICD-10-CM | POA: Diagnosis present

## 2021-02-18 DIAGNOSIS — K5900 Constipation, unspecified: Secondary | ICD-10-CM | POA: Diagnosis present

## 2021-02-18 DIAGNOSIS — Z8249 Family history of ischemic heart disease and other diseases of the circulatory system: Secondary | ICD-10-CM | POA: Diagnosis not present

## 2021-02-18 DIAGNOSIS — I10 Essential (primary) hypertension: Secondary | ICD-10-CM | POA: Diagnosis present

## 2021-02-18 DIAGNOSIS — Z419 Encounter for procedure for purposes other than remedying health state, unspecified: Secondary | ICD-10-CM

## 2021-02-18 DIAGNOSIS — Z96649 Presence of unspecified artificial hip joint: Secondary | ICD-10-CM

## 2021-02-18 DIAGNOSIS — E538 Deficiency of other specified B group vitamins: Secondary | ICD-10-CM | POA: Diagnosis present

## 2021-02-18 DIAGNOSIS — G8918 Other acute postprocedural pain: Secondary | ICD-10-CM

## 2021-02-18 DIAGNOSIS — K219 Gastro-esophageal reflux disease without esophagitis: Secondary | ICD-10-CM | POA: Diagnosis present

## 2021-02-18 HISTORY — PX: TOTAL HIP ARTHROPLASTY: SHX124

## 2021-02-18 LAB — CBC
HCT: 30.8 % — ABNORMAL LOW (ref 36.0–46.0)
Hemoglobin: 9.8 g/dL — ABNORMAL LOW (ref 12.0–15.0)
MCH: 29.3 pg (ref 26.0–34.0)
MCHC: 31.8 g/dL (ref 30.0–36.0)
MCV: 91.9 fL (ref 80.0–100.0)
Platelets: 333 10*3/uL (ref 150–400)
RBC: 3.35 MIL/uL — ABNORMAL LOW (ref 3.87–5.11)
RDW: 12.7 % (ref 11.5–15.5)
WBC: 20.3 10*3/uL — ABNORMAL HIGH (ref 4.0–10.5)
nRBC: 0 % (ref 0.0–0.2)

## 2021-02-18 LAB — CREATININE, SERUM
Creatinine, Ser: 0.72 mg/dL (ref 0.44–1.00)
GFR, Estimated: >60 mL/min (ref >60)

## 2021-02-18 LAB — ABO/RH: ABO/RH(D): O NEG

## 2021-02-18 SURGERY — ARTHROPLASTY, HIP, TOTAL, ANTERIOR APPROACH
Anesthesia: Spinal | Site: Hip | Laterality: Right

## 2021-02-18 MED ORDER — BUPIVACAINE-EPINEPHRINE 0.25% -1:200000 IJ SOLN
INTRAMUSCULAR | Status: DC | PRN
  Administered 2021-02-18: 30 mL

## 2021-02-18 MED ORDER — ORAL CARE MOUTH RINSE: 15.0000 mL | Freq: Once | OROMUCOSAL | Status: AC

## 2021-02-18 MED ORDER — CHLORHEXIDINE GLUCONATE 0.12 % MT SOLN: 15.0000 mL | Freq: Once | OROMUCOSAL | Status: AC

## 2021-02-18 MED ORDER — METOCLOPRAMIDE HCL 5 MG/ML IJ SOLN: 5.0000 mg | Freq: Three times a day (TID) | INTRAMUSCULAR | Status: DC | PRN

## 2021-02-18 MED ORDER — CLOBETASOL PROPIONATE 0.05 % EX OINT
1.0000 "application " | TOPICAL_OINTMENT | Freq: Every day | CUTANEOUS | Status: DC | PRN
  Filled 2021-02-18: qty 15

## 2021-02-18 MED ORDER — CHLORHEXIDINE GLUCONATE 0.12 % MT SOLN
OROMUCOSAL | Status: AC
  Administered 2021-02-18: 15 mL via OROMUCOSAL
  Filled 2021-02-18: qty 15

## 2021-02-18 MED ORDER — EPHEDRINE SULFATE 50 MG/ML IJ SOLN
INTRAMUSCULAR | Status: DC | PRN
  Administered 2021-02-18 (×2): 10 mg via INTRAVENOUS

## 2021-02-18 MED ORDER — BUPIVACAINE LIPOSOME 1.3 % IJ SUSP
INTRAMUSCULAR | Status: AC
  Filled 2021-02-18: qty 20

## 2021-02-18 MED ORDER — DIPHENHYDRAMINE HCL 12.5 MG/5ML PO ELIX
12.5000 mg | ORAL_SOLUTION | ORAL | Status: DC | PRN
Start: 2021-02-18 — End: 2021-02-19

## 2021-02-18 MED ORDER — MIDAZOLAM HCL 2 MG/2ML IJ SOLN
INTRAMUSCULAR | Status: AC
  Filled 2021-02-18: qty 2

## 2021-02-18 MED ORDER — LIDOCAINE HCL (PF) 1 % IJ SOLN
INTRAMUSCULAR | Status: AC
  Filled 2021-02-18: qty 30

## 2021-02-18 MED ORDER — BUPIVACAINE HCL (PF) 0.5 % IJ SOLN
INTRAMUSCULAR | Status: AC
  Filled 2021-02-18: qty 30

## 2021-02-18 MED ORDER — TRAMADOL HCL 50 MG PO TABS
50.0000 mg | ORAL_TABLET | Freq: Four times a day (QID) | ORAL | Status: DC
Start: 2021-02-18 — End: 2021-02-19
  Administered 2021-02-18 – 2021-02-19 (×3): 50 mg via ORAL
  Filled 2021-02-18 (×3): qty 1

## 2021-02-18 MED ORDER — FLUTICASONE PROPIONATE 50 MCG/ACT NA SUSP
1.0000 | Freq: Every day | NASAL | Status: DC | PRN
  Filled 2021-02-18: qty 16

## 2021-02-18 MED ORDER — PANTOPRAZOLE SODIUM 40 MG PO TBEC
80.0000 mg | DELAYED_RELEASE_TABLET | Freq: Every day | ORAL | Status: DC
  Administered 2021-02-19: 80 mg via ORAL
  Filled 2021-02-18: qty 2

## 2021-02-18 MED ORDER — ACETAMINOPHEN 10 MG/ML IV SOLN
INTRAVENOUS | Status: DC | PRN
  Administered 2021-02-18: 1000 mg via INTRAVENOUS

## 2021-02-18 MED ORDER — ACETAMINOPHEN 325 MG PO TABS
325.0000 mg | ORAL_TABLET | Freq: Four times a day (QID) | ORAL | Status: DC | PRN
  Administered 2021-02-19: 650 mg via ORAL
  Filled 2021-02-18: qty 2

## 2021-02-18 MED ORDER — DEXAMETHASONE SODIUM PHOSPHATE 10 MG/ML IJ SOLN
INTRAMUSCULAR | Status: DC | PRN
  Administered 2021-02-18: 4 mg via INTRAVENOUS

## 2021-02-18 MED ORDER — FENTANYL CITRATE (PF) 100 MCG/2ML IJ SOLN
INTRAMUSCULAR | Status: AC
  Filled 2021-02-18: qty 2

## 2021-02-18 MED ORDER — BUPIVACAINE-EPINEPHRINE (PF) 0.25% -1:200000 IJ SOLN
INTRAMUSCULAR | Status: AC
  Filled 2021-02-18: qty 30

## 2021-02-18 MED ORDER — VANCOMYCIN HCL 1000 MG IV SOLR
INTRAVENOUS | Status: AC
  Filled 2021-02-18: qty 1000

## 2021-02-18 MED ORDER — ONDANSETRON HCL 4 MG/2ML IJ SOLN
4.0000 mg | Freq: Once | INTRAMUSCULAR | Status: AC | PRN
  Administered 2021-02-18: 4 mg via INTRAVENOUS

## 2021-02-18 MED ORDER — METHOCARBAMOL 1000 MG/10ML IJ SOLN
500.0000 mg | Freq: Four times a day (QID) | INTRAVENOUS | Status: DC | PRN
  Filled 2021-02-18: qty 5

## 2021-02-18 MED ORDER — DEXAMETHASONE SODIUM PHOSPHATE 4 MG/ML IJ SOLN
INTRAMUSCULAR | Status: AC
  Filled 2021-02-18: qty 1

## 2021-02-18 MED ORDER — ZOLPIDEM TARTRATE 5 MG PO TABS
5.0000 mg | ORAL_TABLET | Freq: Every day | ORAL | Status: DC
  Administered 2021-02-18: 5 mg via ORAL
  Filled 2021-02-18: qty 1

## 2021-02-18 MED ORDER — SODIUM CHLORIDE FLUSH 0.9 % IV SOLN
INTRAVENOUS | Status: AC
  Filled 2021-02-18: qty 10

## 2021-02-18 MED ORDER — METOPROLOL SUCCINATE ER 50 MG PO TB24
50.0000 mg | ORAL_TABLET | Freq: Every day | ORAL | Status: DC
  Administered 2021-02-19: 50 mg via ORAL
  Filled 2021-02-18: qty 1

## 2021-02-18 MED ORDER — NEOMYCIN-POLYMYXIN B GU 40-200000 IR SOLN
Status: DC | PRN
  Administered 2021-02-18: 2 mL

## 2021-02-18 MED ORDER — EPHEDRINE SULFATE 50 MG/ML IJ SOLN
INTRAMUSCULAR | Status: AC
  Filled 2021-02-18: qty 1

## 2021-02-18 MED ORDER — MORPHINE SULFATE (PF) 2 MG/ML IV SOLN
0.5000 mg | INTRAVENOUS | Status: DC | PRN
Start: 2021-02-18 — End: 2021-02-19

## 2021-02-18 MED ORDER — ENOXAPARIN SODIUM 40 MG/0.4ML ~~LOC~~ SOLN
40.0000 mg | SUBCUTANEOUS | Status: DC
  Administered 2021-02-19: 40 mg via SUBCUTANEOUS
  Filled 2021-02-18: qty 0.4

## 2021-02-18 MED ORDER — DOCUSATE SODIUM 100 MG PO CAPS
100.0000 mg | ORAL_CAPSULE | Freq: Two times a day (BID) | ORAL | Status: DC
  Administered 2021-02-18 – 2021-02-19 (×2): 100 mg via ORAL
  Filled 2021-02-18 (×2): qty 1

## 2021-02-18 MED ORDER — METOCLOPRAMIDE HCL 10 MG PO TABS: 5.0000 mg | ORAL_TABLET | Freq: Three times a day (TID) | ORAL | Status: DC | PRN

## 2021-02-18 MED ORDER — ONDANSETRON HCL 4 MG PO TABS: 4.0000 mg | ORAL_TABLET | Freq: Four times a day (QID) | ORAL | Status: DC | PRN

## 2021-02-18 MED ORDER — MAGNESIUM HYDROXIDE 400 MG/5ML PO SUSP: 30.0000 mL | Freq: Every day | ORAL | Status: DC | PRN

## 2021-02-18 MED ORDER — PROMETHAZINE HCL 25 MG/ML IJ SOLN
6.2500 mg | Freq: Once | INTRAMUSCULAR | Status: AC
  Administered 2021-02-18: 6.25 mg via INTRAVENOUS

## 2021-02-18 MED ORDER — ALUM & MAG HYDROXIDE-SIMETH 200-200-20 MG/5ML PO SUSP
30.0000 mL | ORAL | Status: DC | PRN
  Administered 2021-02-18: 30 mL via ORAL
  Filled 2021-02-18: qty 30

## 2021-02-18 MED ORDER — PROPOFOL 500 MG/50ML IV EMUL
INTRAVENOUS | Status: DC | PRN
  Administered 2021-02-18: 75 ug/kg/min via INTRAVENOUS

## 2021-02-18 MED ORDER — LORATADINE 10 MG PO TABS
10.0000 mg | ORAL_TABLET | Freq: Every day | ORAL | Status: DC
  Administered 2021-02-18 – 2021-02-19 (×2): 10 mg via ORAL
  Filled 2021-02-18 (×2): qty 1

## 2021-02-18 MED ORDER — HYDROCODONE-ACETAMINOPHEN 5-325 MG PO TABS
1.0000 | ORAL_TABLET | ORAL | Status: DC | PRN
  Administered 2021-02-18 (×2): 1 via ORAL
  Filled 2021-02-18 (×2): qty 1

## 2021-02-18 MED ORDER — SODIUM CHLORIDE (PF) 0.9 % IJ SOLN
INTRAMUSCULAR | Status: AC
  Filled 2021-02-18: qty 50

## 2021-02-18 MED ORDER — TURMERIC 500 MG PO CAPS: 500.0000 mg | ORAL_CAPSULE | Freq: Every day | ORAL | Status: DC

## 2021-02-18 MED ORDER — PROPOFOL 10 MG/ML IV BOLUS
INTRAVENOUS | Status: AC
  Filled 2021-02-18: qty 60

## 2021-02-18 MED ORDER — PHENYLEPHRINE HCL (PRESSORS) 10 MG/ML IV SOLN
INTRAVENOUS | Status: DC | PRN
  Administered 2021-02-18: 100 ug via INTRAVENOUS

## 2021-02-18 MED ORDER — CEFAZOLIN SODIUM-DEXTROSE 2-4 GM/100ML-% IV SOLN
INTRAVENOUS | Status: AC
  Filled 2021-02-18: qty 100

## 2021-02-18 MED ORDER — PROPOFOL 10 MG/ML IV BOLUS
INTRAVENOUS | Status: DC | PRN
  Administered 2021-02-18: 50 mg via INTRAVENOUS
  Administered 2021-02-18: 30 mg via INTRAVENOUS
  Administered 2021-02-18 (×2): 40 mg via INTRAVENOUS
  Administered 2021-02-18: 30 mg via INTRAVENOUS

## 2021-02-18 MED ORDER — PHENOL 1.4 % MT LIQD
1.0000 | OROMUCOSAL | Status: DC | PRN
  Filled 2021-02-18: qty 177

## 2021-02-18 MED ORDER — ONDANSETRON HCL 4 MG/2ML IJ SOLN: 4.0000 mg | Freq: Four times a day (QID) | INTRAMUSCULAR | Status: DC | PRN

## 2021-02-18 MED ORDER — EPHEDRINE SULFATE 50 MG/ML IJ SOLN
10.0000 mg | Freq: Once | INTRAMUSCULAR | Status: AC
  Administered 2021-02-18: 10 mg via INTRAVENOUS

## 2021-02-18 MED ORDER — LACTATED RINGERS IV SOLN: INTRAVENOUS | Status: DC

## 2021-02-18 MED ORDER — PROPOFOL 500 MG/50ML IV EMUL
INTRAVENOUS | Status: AC
  Filled 2021-02-18: qty 50

## 2021-02-18 MED ORDER — MELATONIN 5 MG PO TABS
5.0000 mg | ORAL_TABLET | Freq: Every day | ORAL | Status: DC
  Administered 2021-02-18: 5 mg via ORAL
  Filled 2021-02-18: qty 1

## 2021-02-18 MED ORDER — BISACODYL 10 MG RE SUPP: 10.0000 mg | Freq: Every day | RECTAL | Status: DC | PRN

## 2021-02-18 MED ORDER — GABAPENTIN 100 MG PO CAPS
100.0000 mg | ORAL_CAPSULE | Freq: Three times a day (TID) | ORAL | Status: DC
  Administered 2021-02-18 – 2021-02-19 (×3): 100 mg via ORAL
  Filled 2021-02-18 (×3): qty 1

## 2021-02-18 MED ORDER — VITAMIN B-12 100 MCG PO TABS
100.0000 ug | ORAL_TABLET | Freq: Every day | ORAL | Status: DC
  Filled 2021-02-18 (×2): qty 1

## 2021-02-18 MED ORDER — SODIUM CHLORIDE 0.9 % IV SOLN: INTRAVENOUS | Status: DC

## 2021-02-18 MED ORDER — VENLAFAXINE HCL ER 75 MG PO CP24
75.0000 mg | ORAL_CAPSULE | Freq: Every day | ORAL | Status: DC
  Administered 2021-02-19: 75 mg via ORAL
  Filled 2021-02-18: qty 1

## 2021-02-18 MED ORDER — FENTANYL CITRATE (PF) 100 MCG/2ML IJ SOLN: 25.0000 ug | INTRAMUSCULAR | Status: DC | PRN

## 2021-02-18 MED ORDER — ESMOLOL HCL 100 MG/10ML IV SOLN
INTRAVENOUS | Status: DC | PRN
  Administered 2021-02-18: 10 mg via INTRAVENOUS

## 2021-02-18 MED ORDER — SODIUM CHLORIDE 0.9 % IV SOLN
INTRAVENOUS | Status: DC | PRN
  Administered 2021-02-18: 60 mL

## 2021-02-18 MED ORDER — HYDROCODONE-ACETAMINOPHEN 7.5-325 MG PO TABS
1.0000 | ORAL_TABLET | ORAL | Status: DC | PRN
  Administered 2021-02-18: 1 via ORAL
  Filled 2021-02-18: qty 1

## 2021-02-18 MED ORDER — PREDNISONE 10 MG PO TABS
5.0000 mg | ORAL_TABLET | ORAL | Status: DC
  Administered 2021-02-19: 5 mg via ORAL
  Filled 2021-02-18: qty 1

## 2021-02-18 MED ORDER — FOLIC ACID 1 MG PO TABS
1.0000 mg | ORAL_TABLET | Freq: Every day | ORAL | Status: DC
  Administered 2021-02-18 – 2021-02-19 (×2): 1 mg via ORAL
  Filled 2021-02-18 (×2): qty 1

## 2021-02-18 MED ORDER — FE FUMARATE-B12-VIT C-FA-IFC PO CAPS
1.0000 | ORAL_CAPSULE | Freq: Two times a day (BID) | ORAL | Status: DC
  Administered 2021-02-18: 1 via ORAL
  Filled 2021-02-18 (×3): qty 1

## 2021-02-18 MED ORDER — PROMETHAZINE HCL 25 MG/ML IJ SOLN
INTRAMUSCULAR | Status: AC
  Filled 2021-02-18: qty 1

## 2021-02-18 MED ORDER — ESMOLOL HCL 100 MG/10ML IV SOLN
INTRAVENOUS | Status: AC
  Filled 2021-02-18: qty 10

## 2021-02-18 MED ORDER — ACETAMINOPHEN 10 MG/ML IV SOLN
INTRAVENOUS | Status: AC
  Filled 2021-02-18: qty 100

## 2021-02-18 MED ORDER — NEOMYCIN-POLYMYXIN B GU 40-200000 IR SOLN
Status: AC
  Filled 2021-02-18: qty 2

## 2021-02-18 MED ORDER — CEFAZOLIN SODIUM-DEXTROSE 2-4 GM/100ML-% IV SOLN
2.0000 g | Freq: Four times a day (QID) | INTRAVENOUS | Status: AC
  Administered 2021-02-18: 2 g via INTRAVENOUS
  Filled 2021-02-18: qty 2
  Filled 2021-02-18 (×3): qty 100

## 2021-02-18 MED ORDER — LIDOCAINE HCL (CARDIAC) PF 100 MG/5ML IV SOSY
PREFILLED_SYRINGE | INTRAVENOUS | Status: DC | PRN
  Administered 2021-02-18: 40 mg via INTRAVENOUS

## 2021-02-18 MED ORDER — METHOCARBAMOL 500 MG PO TABS
500.0000 mg | ORAL_TABLET | Freq: Four times a day (QID) | ORAL | Status: DC | PRN
  Administered 2021-02-18: 500 mg via ORAL
  Filled 2021-02-18: qty 1

## 2021-02-18 MED ORDER — MIDAZOLAM HCL 5 MG/5ML IJ SOLN
INTRAMUSCULAR | Status: DC | PRN
  Administered 2021-02-18: 2 mg via INTRAVENOUS

## 2021-02-18 MED ORDER — ONDANSETRON HCL 4 MG/2ML IJ SOLN
INTRAMUSCULAR | Status: DC | PRN
  Administered 2021-02-18: 4 mg via INTRAVENOUS

## 2021-02-18 MED ORDER — B COMPLEX-C PO TABS
1.0000 | ORAL_TABLET | Freq: Every day | ORAL | Status: DC
  Filled 2021-02-18 (×2): qty 1

## 2021-02-18 MED ORDER — CEFAZOLIN SODIUM-DEXTROSE 2-4 GM/100ML-% IV SOLN
2.0000 g | INTRAVENOUS | Status: AC
  Administered 2021-02-18: 2 g via INTRAVENOUS

## 2021-02-18 MED ORDER — VITAMIN D 25 MCG (1000 UNIT) PO TABS
4000.0000 [IU] | ORAL_TABLET | Freq: Every day | ORAL | Status: DC
  Administered 2021-02-18: 4000 [IU] via ORAL
  Filled 2021-02-18 (×2): qty 4

## 2021-02-18 MED ORDER — CEFAZOLIN SODIUM-DEXTROSE 2-4 GM/100ML-% IV SOLN: 2.0000 g | Freq: Four times a day (QID) | INTRAVENOUS | Status: DC

## 2021-02-18 MED ORDER — MAGNESIUM CITRATE PO SOLN
1.0000 | Freq: Once | ORAL | Status: DC | PRN
  Filled 2021-02-18: qty 296

## 2021-02-18 MED ORDER — ONDANSETRON HCL 4 MG/2ML IJ SOLN
INTRAMUSCULAR | Status: AC
  Filled 2021-02-18: qty 2

## 2021-02-18 MED ORDER — SODIUM CHLORIDE 0.9 % IV SOLN
INTRAVENOUS | Status: DC | PRN
  Administered 2021-02-18: 25 ug/min via INTRAVENOUS

## 2021-02-18 MED ORDER — MENTHOL 3 MG MT LOZG
1.0000 | LOZENGE | OROMUCOSAL | Status: DC | PRN
  Filled 2021-02-18: qty 9

## 2021-02-18 MED ORDER — BUPIVACAINE HCL (PF) 0.5 % IJ SOLN
INTRAMUSCULAR | Status: DC | PRN
  Administered 2021-02-18: 2.8 mL via INTRATHECAL

## 2021-02-18 MED ORDER — LIDOCAINE-EPINEPHRINE 1 %-1:100000 IJ SOLN
INTRAMUSCULAR | Status: AC
  Filled 2021-02-18: qty 1

## 2021-02-18 SURGICAL SUPPLY — 63 items
BLADE SAGITTAL AGGR TOOTH XLG (BLADE) ×2 IMPLANT
BNDG COHESIVE 6X5 TAN STRL LF (GAUZE/BANDAGES/DRESSINGS) ×6 IMPLANT
CANISTER SUCT 1200ML W/VALVE (MISCELLANEOUS) ×2 IMPLANT
CANISTER WOUND CARE 500ML ATS (WOUND CARE) ×2 IMPLANT
CHLORAPREP W/TINT 26 (MISCELLANEOUS) ×2 IMPLANT
COVER BACK TABLE REUSABLE LG (DRAPES) ×2 IMPLANT
COVER WAND RF STERILE (DRAPES) ×2 IMPLANT
CUP ACETAB VERSA DBL 28X58 DMI (Orthopedic Implant) ×2 IMPLANT
DRAPE 3/4 80X56 (DRAPES) ×6 IMPLANT
DRAPE C-ARM XRAY 36X54 (DRAPES) ×2 IMPLANT
DRAPE POUCH INSTRU U-SHP 10X18 (DRAPES) ×2 IMPLANT
DRESSING PREVENA PLUS CUSTOM (GAUZE/BANDAGES/DRESSINGS) ×1 IMPLANT
DRESSING SURGICEL FIBRLLR 1X2 (HEMOSTASIS) ×2 IMPLANT
DRSG MEPILEX SACRM 8.7X9.8 (GAUZE/BANDAGES/DRESSINGS) ×2 IMPLANT
DRSG OPSITE POSTOP 4X8 (GAUZE/BANDAGES/DRESSINGS) ×4 IMPLANT
DRSG PREVENA PLUS CUSTOM (GAUZE/BANDAGES/DRESSINGS) ×2
DRSG SURGICEL FIBRILLAR 1X2 (HEMOSTASIS) ×4
ELECT BLADE 6.5 EXT (BLADE) ×2 IMPLANT
ELECT CAUTERY BLADE 6.4 (BLADE) ×2 IMPLANT
ELECT REM PT RETURN 9FT ADLT (ELECTROSURGICAL) ×2
ELECTRODE REM PT RTRN 9FT ADLT (ELECTROSURGICAL) ×1 IMPLANT
GLOVE SURG SYN 9.0  PF PI (GLOVE) ×2
GLOVE SURG SYN 9.0 PF PI (GLOVE) ×2 IMPLANT
GLOVE SURG UNDER POLY LF SZ9 (GLOVE) ×2 IMPLANT
GOWN SRG 2XL LVL 4 RGLN SLV (GOWNS) ×1 IMPLANT
GOWN STRL NON-REIN 2XL LVL4 (GOWNS) ×1
GOWN STRL REUS W/ TWL LRG LVL3 (GOWN DISPOSABLE) ×1 IMPLANT
GOWN STRL REUS W/TWL LRG LVL3 (GOWN DISPOSABLE) ×1
HEAD FEMORAL SZ28 LGE BIOLOX (Head) ×2 IMPLANT
HEMOVAC 400CC 10FR (MISCELLANEOUS) IMPLANT
HOLDER FOLEY CATH W/STRAP (MISCELLANEOUS) ×2 IMPLANT
HOOD PEEL AWAY FLYTE STAYCOOL (MISCELLANEOUS) ×2 IMPLANT
IRRIGATION SURGIPHOR STRL (IV SOLUTION) IMPLANT
KIT PREVENA INCISION MGT 13 (CANNISTER) ×2 IMPLANT
MANIFOLD NEPTUNE II (INSTRUMENTS) ×2 IMPLANT
MAT ABSORB  FLUID 56X50 GRAY (MISCELLANEOUS) ×1
MAT ABSORB FLUID 56X50 GRAY (MISCELLANEOUS) ×1 IMPLANT
NDL SAFETY ECLIPSE 18X1.5 (NEEDLE) ×1 IMPLANT
NEEDLE HYPO 18GX1.5 SHARP (NEEDLE) ×1
NEEDLE SPNL 20GX3.5 QUINCKE YW (NEEDLE) ×4 IMPLANT
NS IRRIG 1000ML POUR BTL (IV SOLUTION) ×2 IMPLANT
PACK HIP COMPR (MISCELLANEOUS) ×2 IMPLANT
SCALPEL PROTECTED #10 DISP (BLADE) ×4 IMPLANT
SHELL ACETABULAR SZ0 58MM (Shell) ×2 IMPLANT
SOL PREP PVP 2OZ (MISCELLANEOUS) ×2
SOLUTION PREP PVP 2OZ (MISCELLANEOUS) ×1 IMPLANT
SPONGE DRAIN TRACH 4X4 STRL 2S (GAUZE/BANDAGES/DRESSINGS) ×2 IMPLANT
STAPLER SKIN PROX 35W (STAPLE) ×2 IMPLANT
STEM FEMORAL 4 STD COLLARED (Stem) ×2 IMPLANT
STRAP SAFETY 5IN WIDE (MISCELLANEOUS) ×2 IMPLANT
SUT DVC 2 QUILL PDO  T11 36X36 (SUTURE) ×1
SUT DVC 2 QUILL PDO T11 36X36 (SUTURE) ×1 IMPLANT
SUT SILK 0 (SUTURE) ×1
SUT SILK 0 30XBRD TIE 6 (SUTURE) ×1 IMPLANT
SUT V-LOC 90 ABS DVC 3-0 CL (SUTURE) ×2 IMPLANT
SUT VIC AB 1 CT1 36 (SUTURE) ×2 IMPLANT
SYR 20ML LL LF (SYRINGE) ×2 IMPLANT
SYR 30ML LL (SYRINGE) ×2 IMPLANT
SYR 50ML LL SCALE MARK (SYRINGE) ×4 IMPLANT
SYR BULB IRRIG 60ML STRL (SYRINGE) ×2 IMPLANT
TAPE MICROFOAM 4IN (TAPE) ×2 IMPLANT
TOWEL OR 17X26 4PK STRL BLUE (TOWEL DISPOSABLE) ×2 IMPLANT
TRAY FOLEY MTR SLVR 16FR STAT (SET/KITS/TRAYS/PACK) ×2 IMPLANT

## 2021-02-18 NOTE — Anesthesia Procedure Notes (Signed)
Spinal  Patient location during procedure: OR Start time: 02/18/2021 7:20 AM End time: 02/18/2021 7:27 AM Reason for block: surgical anesthesia Staffing Performed: resident/CRNA  Anesthesiologist: Emmie Niemann, MD Resident/CRNA: Lowry Bowl, CRNA Preanesthetic Checklist Completed: patient identified, IV checked, site marked, risks and benefits discussed, surgical consent, monitors and equipment checked, pre-op evaluation and timeout performed Spinal Block Patient position: sitting Prep: DuraPrep Patient monitoring: heart rate, cardiac monitor, continuous pulse ox and blood pressure Approach: midline Location: L3-4 Injection technique: single-shot Needle Needle type: Sprotte  Needle gauge: 24 G Needle length: 9 cm Assessment Sensory level: T4 Events: CSF return

## 2021-02-18 NOTE — Op Note (Signed)
02/18/2021  9:20 AM  PATIENT:  Janice Rowe  65 y.o. female  PRE-OPERATIVE DIAGNOSIS:  Primary osteoarthritis of right hip M16.11  POST-OPERATIVE DIAGNOSIS:  Primary osteoarthritis of right hip M16.11  PROCEDURE:  Procedure(s): TOTAL HIP ARTHROPLASTY ANTERIOR APPROACH (Right)  SURGEON: Laurene Footman, MD  ASSISTANTS: None  ANESTHESIA:   spinal  EBL:  Total I/O In: 1100 [I.V.:900; IV Piggyback:200] Out: 600 [Urine:300; Blood:300]  BLOOD ADMINISTERED:none  DRAINS: Incisional wound VAC   LOCAL MEDICATIONS USED:  MARCAINE    and OTHER Exparel  SPECIMEN:  Source of Specimen:  Right femoral head  DISPOSITION OF SPECIMEN:  PATHOLOGY  COUNTS:  YES  TOURNIQUET:  * No tourniquets in log *  IMPLANTS: Medacta AMIS 4 standard stem with ceramic L 28 mm head, 58 mm Mpact DM cup and liner.  DICTATION: Viviann Spare Dictation   The patient was brought to the operating room and after spinal anesthesia was obtained patient was placed on the operative table with the ipsilateral foot into the Medacta attachment, contralateral leg on a well-padded table. C-arm was brought in and preop template x-ray taken. After prepping and draping in usual sterile fashion appropriate patient identification and timeout procedures were completed. Anterior approach to the hip was obtained and centered over the greater trochanter and TFL muscle. The subcutaneous tissue was incised hemostasis being achieved by electrocautery. TFL fascia was incised and the muscle retracted laterally deep retractor placed. The lateral femoral circumflex vessels were identified and ligated. The anterior capsule was exposed and a capsulotomy performed. The neck was identified and a femoral neck cut carried out with a saw. The head was removed without difficulty and showed sclerotic femoral head and acetabulum. Reaming was carried out to 58 mm and a 58 mm cup trial gave appropriate tightness to the acetabular component a 58 DM  cup was impacted  into position. The leg was then externally rotated and ischiofemoral and pubofemoral releases carried out. The femur was sequentially broached to a size 4, size 4 standard with S and then L head trials were placed and the final components chosen. The 4 standard stem was inserted along with a ceramic L 28 mm head and 58 mm liner. The hip was reduced and was stable the wound was thoroughly irrigated with fibrillar placed along the posterior capsule and medial neck. The deep fascia ws closed using a heavy Quill after infiltration of 30 cc of quarter percent Sensorcaine with epinephrine diluted with Exparel .3-0 V-loc to close the skin with skin staples.  Incisional wound VAC applied and patient was sent to recovery in stable condition.   PLAN OF CARE: Admit for overnight observation

## 2021-02-18 NOTE — OR Nursing (Signed)
Attempted Foley placement  Using  16 FR  Foley kit.  Patient  Has a narrow external urethral meatus. 10 fr non latex foley placed.

## 2021-02-18 NOTE — H&P (Signed)
Chief Complaint  Patient presents with  . Establish Care  Low back, Rt Hip pain    History of the Present Illness: Janice Rowe is a 65 y.o. female here today.   The patient presents for evaluation of right hip osteoarthritis. She had prior x-rays obtained by Dorthula Matas, MD on 10/07/2020, showing severe joint arthritis with complete loss of joint space, and subchondral sclerosis. She also has bilateral sacroiliac degenerative changes.  The patient states the patient has had back injections. She also notes she is not sleeping or moving much at all due to pain. The patient states she was placed on tramadol 2 weeks ago by Dr. Jefm Bryant, and she is not tolerating it well. She states the gabapentin has helped the sciatica pain, but she still has pain to the superior aspect of her right hip. The patient states she can walk really well with the walker. She presents with a cane for ambulation today.  The patient states she also had MRIs of the back and right hip. She reports Dr. Jefm Bryant asked Dr. Lake Bells to looks at the scans. He told the patient there was nothing wrong with her hip beside osteoarthritis, but he did not think it was a good scan.   The patient rheumatoid arthritis and takes methotrexate and Humira. She also takes naproxen.   I have reviewed past medical, surgical, social and family history, and allergies as documented in the EMR.  Past Medical History: Past Medical History:  Diagnosis Date  . Abdominal pain 12/2006  CT of the abdomen and pelvis revealing constipation without other sources. She was noted to have small pericardial effusion, thought to be secondary to rheumatoid arthritis. Had echocardiogram performed revealing preserved LV function with small effusion, no evidence of tamponade.  . Abnormal mammogram  followed by Dr. Bary Castilla  . Acute pancreatitis 01/10  hospitalized, possible transient gallstone. Evaluated by GI (Dr. Allen Norris) with spontaneous resolution.   . Anemia  Iron, folate, ferritin levels, UPEP/SPEP normal 2014; B12 deficiency noted.  . Arthritis  . Chickenpox  . Hematuria  . Hypertension  . Hypoglycemia  . Obstructive sleep apnea 09/15/2015  Sleep study 10/16 with AHI 40.8; autocpap arranged  . Ocular migraine 08/02/2016  Transient vision changes 3/17. MRI, carotid Dopplers unremarkable. Followed by ophthalmology optometry, and felt to be most consistent with ocular migraine  . Rheumatoid arthritis with rheumatoid factor (CMS-HCC)  a.Plaquenil. b.Methotrexate. c.Seropositive, erosive. d.Enbrel.  . Vitamin D deficiency   Past Surgical History: Past Surgical History:  Procedure Laterality Date  . COLONOSCOPY 04/30/2006  . Cyst excisions  . ENDOSCOPIC CARPAL TUNNEL RELEASE Right   Past Family History: Family History  Problem Relation Age of Onset  . High blood pressure (Hypertension) Mother  . Breast cancer Mother  . High blood pressure (Hypertension) Father   Medications: Current Outpatient Medications Ordered in Epic  Medication Sig Dispense Refill  . acetaminophen (TYLENOL) 500 MG tablet Take 500 mg by mouth as needed for Pain Takes this at night.  Marland Kitchen adalimumab (HUMIRA,CF,) 40 mg/0.4 mL prefilled syringe kit Inject 0.4 mLs (40 mg total) subcutaneously every 14 (fourteen) days 6 each 2  . albuterol 90 mcg/actuation inhaler Inhale 2 inhalations into the lungs every 6 (six) hours as needed for Wheezing 18 g 2  . azelastine (ASTELIN) 137 mcg nasal spray Place 1 spray into both nostrils 2 (two) times daily as needed for Rhinitis 30 mL 12  . cetirizine (ZYRTEC) 10 MG tablet TAKE 1 TABLET BY MOUTH ONCE DAILY.  91 tablet 3  . cholecalciferol, vitamin D3, (VITAMIN D3) 4,000 unit Cap Take by mouth once daily.  . clobetasoL (TEMOVATE) 0.05 % ointment Apply topically  . folic acid (FOLVITE) 1 MG tablet TAKE 1 TABLET BY MOUTH ONCE DAILY. 30 tablet 11  . gabapentin (NEURONTIN) 100 MG capsule Take 1 capsule (100 mg total) by mouth 3  (three) times daily 90 capsule 1  . melatonin 3 mg tablet Take by mouth Takes one at bedtime as needed  . methotrexate (RHEUMATREX) 2.5 MG tablet TAKE 5 TABLETS (12.5MG TOTAL) BY MOUTH ONCE EVERY 7 DAYS. 20 tablet 11  . metoprolol succinate (TOPROL-XL) 50 MG XL tablet TAKE 1 TABLET BY MOUTH ONCE DAILY. 90 tablet 3  . naproxen (NAPROSYN) 500 MG tablet TAKE (1) TABLET TWICE A DAY WITH FOOD---BREAKFAST AND SUPPER. 60 tablet 0  . omega-3 fatty acids/fish oil 340-1,000 mg capsule Take 1 capsule by mouth once daily.  Marland Kitchen omeprazole (PRILOSEC) 20 MG DR capsule TAKE (1) CAPSULE BY MOUTH DAILY BEFORE BREAKFAST. 90 capsule 3  . traMADoL (ULTRAM) 50 mg tablet Take 1 tablet (50 mg total) by mouth 2 (two) times daily for 15 days 30 tablet 0  . TURMERIC, BULK, MISC Use.  . venlafaxine (EFFEXOR-XR) 75 MG XR capsule TAKE (1) CAPSULE BY MOUTH ONCE DAILY. 30 capsule 11  . VITAMIN B COMPLEX ORAL Take by mouth once daily.  Marland Kitchen zolpidem (AMBIEN) 5 MG tablet TAKE 1 TABLET BY MOUTH AT BEDTIME AS NEEDED FOR SLEEP 30 tablet 5  . predniSONE (DELTASONE) 10 MG tablet Take 4 tabs daily for 2 days, then 3 tabs daily for 2 days, then 2 tabs daily for 2 days, then 1 tab daily for 2 days (Patient not taking: Reported on 01/21/2021 ) 20 tablet 0  . predniSONE (DELTASONE) 5 MG tablet TAKE 1 TABLET BY MOUTH ONCE DAILY. (Patient not taking: Reported on 01/21/2021) 90 tablet 3  . predniSONE (DELTASONE) 5 MG tablet 6 pills x1day, 5x1,4x1,3x1,2x1,1x1 (Patient not taking: Reported on 01/21/2021 ) 21 tablet 0   No current Epic-ordered facility-administered medications on file.   Allergies: Allergies  Allergen Reactions  . Rocephin [Ceftriaxone] Nausea and Rash    Body mass index is 30.36 kg/m.  Review of Systems: A comprehensive 14 point ROS was performed, reviewed, and the pertinent orthopaedic findings are documented in the HPI.  Vitals:  01/21/21 1009  BP: 138/72    General Physical Examination:   General/Constitutional: No  apparent distress: well-nourished and well developed. Eyes: Pupils equal, round with synchronous movement. Lungs: Clear to auscultation HEENT: Normal Vascular: No edema, swelling or tenderness, except as noted in detailed exam. Cardiac: Heart rate and rhythm is regular. Integumentary: No impressive skin lesions present, except as noted in detailed exam. Neuro/Psych: Normal mood and affect, oriented to person, place and time.  Musculoskeletal Examination:  On exam, left hip internal rotation is approximately 25 degrees, external is approximately 50 degrees. Right hip internal rotation is 5 degrees, external is 20 degrees with severe pain. Antalgic gait.  Radiographs:  No new imaging studies were obtained today.  Assessment: ICD-10-CM  1. Primary osteoarthritis of right hip M16.11   Plan:  The patient has clinical findings of severe right hip osteoarthritis with hip and back pain.  We discussed the patient's prior x-ray and clinical findings. I explained she has severe right hip osteoarthritis. I recommend right total hip arthroplasty. I explained the surgery and postoperative course in detail. I gave the patient a booklet on total hip arthroplasty.  I advised her after her hip arthroplasty, her back may be less painful. I suggested she talk to Dr. Jefm Bryant about her rheumatoid medications with regards to surgery.  We will schedule the patient for right total hip arthroplasty in the near future.  Surgical Risks:  The nature of the condition and the proposed procedure has been reviewed in detail with the patient. Surgical versus non-surgical options and prognosis for recovery have been reviewed and the inherent risks and benefits of each have been discussed including the risks of infection, bleeding, injury to nerves/blood vessels/tendons, incomplete relief of symptoms, persisting pain and/or stiffness, loss of function, complex regional pain syndrome, failure of the procedure, as  appropriate.  Teeth: Temporary bridge.   Attestation: I, Dawn Royse, am documenting for [employer], MD utilizing [place].     Electronically signed by Lauris Poag, MD at 01/23/2021 6:11 PM EST  Back to top of Progress Notes  Ephriam Jenkins, [place] - 01/21/2021 10:00 AM EST Formatting of this note might be different from the original. Review of Systems  Constitutional: Positive for activity change.  Musculoskeletal: Positive for arthralgias, back pain, gait problem and myalgias.  Neurological: Positive for weakness.    Electronically signed by Ephriam Jenkins, [place] at 01/23/2021 6:11 PM EST  Back to top of Progress Notes  Reviewed  H+P. No changes noted.

## 2021-02-18 NOTE — Transfer of Care (Signed)
Immediate Anesthesia Transfer of Care Note  Patient: Janice Rowe  Procedure(s) Performed: TOTAL HIP ARTHROPLASTY ANTERIOR APPROACH (Right Hip)  Patient Location: PACU  Anesthesia Type:Spinal  Level of Consciousness: awake, drowsy and patient cooperative  Airway & Oxygen Therapy: Patient Spontanous Breathing  Post-op Assessment: Report given to RN and Post -op Vital signs reviewed and stable  Post vital signs: Reviewed and stable  Last Vitals:  Vitals Value Taken Time  BP    Temp 36.1 C 02/18/21 0911  Pulse 65 02/18/21 0911  Resp    SpO2      Last Pain:  Vitals:   02/18/21 0613  TempSrc: Temporal  PainSc: 2     88/53 (65); SpO2 98; RR 16; 51.1 F temp     Complications: No complications documented.

## 2021-02-18 NOTE — Progress Notes (Signed)
PHARMACIST - PHYSICIAN ORDER COMMUNICATION  CONCERNING: P&T Medication Policy on Herbal Medications  DESCRIPTION:  This patient's order for:  turmeric  has been noted.  This product(s) is classified as an "herbal" or natural product. Due to a lack of definitive safety studies or FDA approval, nonstandard manufacturing practices, plus the potential risk of unknown drug-drug interactions while on inpatient medications, the Pharmacy and Therapeutics Committee does not permit the use of "herbal" or natural products of this type within [facility].   ACTION TAKEN: The pharmacy department is unable to verify this order at this time and your patient has been informed of this safety policy. Please reevaluate patient's clinical condition at discharge and address if the herbal or natural product(s) should be resumed at that time.  

## 2021-02-18 NOTE — Evaluation (Signed)
Physical Therapy Evaluation Patient Details Name: Janice Rowe MRN: [id number] DOB: 06/17/1956 Today's Date: 02/18/2021   History of Present Illness  Janice Rowe is a 65 y.o. female admitted for elective R THA anterior approach on 02/18/2021. PMH significant of: Anemia, OSA, HTN, hypoglycemia. Recently received OP PT services from September 07, 2020-11/30/2020 for R hip/RLQ pain prior to operation. Pr Dr. Rudene Christians, pt ambulating with walker "well" prior to operation.    Clinical Impression  Pt admitted with above diagnosis. Pt supine with HOB elevated and agreeable to PT services. Pt educated and performed supine/seated exercises to tolerance. Required AAROM for R hip abduction due to pain. With use of bed rails, and minA from therapist on RLE, pt able to transfer supine to seated EOB with supervision outside RLE management. [place] removed and vitals monitored in order to transfer into recliner. SPO2 > 95% entirety of session on RA with denying lightheadedness/dizziness/SOB. [place] placed after session. Able to perform STS transfer with RW with supervision and stand pivot/step into recliner with excellent sequencing and safety of device. Pt states she knows safe techniques due to her mom being a physical therapy technician. Based off of baseline mobility today and level of assist from PT, anticipate pt will be safe to return home pending future mobility in therapy during stay. Prior to operation pt was ambulating community and household distances with RW/SPC. Pt currently with functional limitations due to the deficits listed below (see PT Problem List). Pt will benefit from skilled PT to increase their independence and safety with mobility to allow discharge to the venue listed below.        Follow Up Recommendations Home health PT;Follow surgeon's recommendation for DC plan and follow-up therapies    Equipment Recommendations  None recommended by PT    Recommendations for Other Services       Precautions /  Restrictions Precautions Precautions: Anterior Hip Precaution Booklet Issued: No Restrictions Weight Bearing Restrictions: Yes RLE Weight Bearing: Weight bearing as tolerated      Mobility  Bed Mobility Overal bed mobility: Modified Independent             General bed mobility comments: Use of bed rails today supine to seated EOB    Transfers Overall transfer level: Modified independent Equipment used: Standard walker                Ambulation/Gait             General Gait Details: Transfered to recliner. No ambulation today.  Stairs            Wheelchair Mobility    Modified Rankin (Stroke Patients Only)       Balance Overall balance assessment: Needs assistance Sitting-balance support: No upper extremity supported;Feet supported Sitting balance-Leahy Scale: Good       Standing balance-Leahy Scale: Fair                               Pertinent Vitals/Pain Pain Assessment: Faces Faces Pain Scale: Hurts little more Pain Location: R hip and Ant thigh Pain Descriptors / Indicators: Aching;Discomfort;Grimacing Pain Intervention(s): Monitored during session;Limited activity within patient's tolerance;Premedicated before session;Repositioned;Ice applied    Home Living Family/patient expects to be discharged to:: Private residence Living Arrangements: Spouse/significant other Available Help at Discharge: Family Type of Home: House Home Access: Stairs to enter Entrance Stairs-Rails: Right Entrance Stairs-Number of Steps: 4 Home Layout: One level;Full bath on main level Home Equipment:  Walker - 2 wheels;Cane - single point;Grab bars - toilet;Grab bars - tub/shower;Shower seat - built in      Prior Function Level of Independence: Independent with assistive device(s)         Comments: Use of RW for home and [facility] for community     Hand Dominance        Extremity/Trunk Assessment   Upper Extremity Assessment Upper  Extremity Assessment: Overall WFL for tasks assessed    Lower Extremity Assessment Lower Extremity Assessment: Overall WFL for tasks assessed;Generalized weakness    Cervical / Trunk Assessment Cervical / Trunk Assessment: Normal  Communication   Communication: No difficulties  Cognition Arousal/Alertness: Awake/alert Behavior During Therapy: WFL for tasks assessed/performed Overall Cognitive Status: Within Functional Limits for tasks assessed                                        General Comments      Exercises Total Joint Exercises Ankle Circles/Pumps: AROM;Both;20 reps;Supine Quad Sets: Supine;AROM;Strengthening;Right;10 reps Gluteal Sets: Supine;Strengthening;Both;15 reps Short Arc Quad: AROM;Strengthening;Right;Supine Heel Slides: AROM;Right;10 reps;Supine Straight Leg Raises: AAROM;Right;10 reps Long Arc Quad: Seated;Right;10 reps Other Exercises Other Exercises: Able to use LLE and BUE's on handrails to pull herself up towards head of bed.   Assessment/Plan    PT Assessment Patient needs continued PT services  PT Problem List Decreased strength;Decreased mobility;Decreased range of motion;Decreased activity tolerance;Decreased balance;Pain       PT Treatment Interventions DME instruction;Therapeutic exercise;Gait training;Balance training;Stair training;Neuromuscular re-education;Functional mobility training;Therapeutic activities;Patient/family education    PT Goals (Current goals can be found in the Care Plan section)  Acute Rehab PT Goals Patient Stated Goal: GO home PT Goal Formulation: With patient Time For Goal Achievement: 02/20/21 Potential to Achieve Goals: Good    Frequency BID   Barriers to discharge        Co-evaluation               AM-PAC PT "6 Clicks" Mobility  Outcome Measure Help needed turning from your back to your side while in a flat bed without using bedrails?: A Little Help needed moving from lying on your  back to sitting on the side of a flat bed without using bedrails?: A Little Help needed moving to and from a bed to a chair (including a wheelchair)?: A Little Help needed standing up from a chair using your arms (e.g., wheelchair or bedside chair)?: A Little Help needed to walk in hospital room?: A Little Help needed climbing 3-5 steps with a railing? : A Lot 6 Click Score: 17    End of Session Equipment Utilized During Treatment: Gait belt Activity Tolerance: Patient tolerated treatment well Patient left: in chair;with nursing/sitter in room;with family/visitor present;with call bell/phone within reach;with SCD's reapplied Nurse Communication: Mobility status PT Visit Diagnosis: Unsteadiness on feet (R26.81);Other abnormalities of gait and mobility (R26.89);Muscle weakness (generalized) (M62.81);Difficulty in walking, not elsewhere classified (R26.2)    Time: 4098-1191 PT Time Calculation (min) (ACUTE ONLY): 36 min   Charges:   PT Evaluation $PT Eval Low Complexity: 1 Low PT Treatments $Therapeutic Exercise: 23-37 mins       Cortne Amara M. Fairly IV, PT, DPT Physical Therapist- [facility]  02/18/2021, 4:36 PM

## 2021-02-18 NOTE — Anesthesia Preprocedure Evaluation (Signed)
Anesthesia Evaluation  Patient identified by MRN, date of birth, ID band Patient awake    Reviewed: Allergy & Precautions, NPO status , Patient's Chart, lab work & pertinent test results  History of Anesthesia Complications Negative for: history of anesthetic complications  Airway Mallampati: II  TM Distance: >3 FB Neck ROM: Full    Dental no notable dental hx.    Pulmonary sleep apnea and Continuous Positive Airway Pressure Ventilation , neg COPD, former smoker,    breath sounds clear to auscultation- rhonchi (-) wheezing      Cardiovascular Exercise Tolerance: Good hypertension, Pt. on medications (-) CAD, (-) Past MI, (-) Cardiac Stents and (-) CABG  Rhythm:Regular Rate:Normal - Systolic murmurs and - Diastolic murmurs    Neuro/Psych  Headaches, neg Seizures PSYCHIATRIC DISORDERS Depression    GI/Hepatic Neg liver ROS, hiatal hernia, GERD  ,  Endo/Other  negative endocrine ROSneg diabetes  Renal/GU negative Renal ROS     Musculoskeletal  (+) Arthritis ,   Abdominal (+) - obese,   Peds  Hematology  (+) anemia ,   Anesthesia Other Findings Past Medical History: 11/2008: Acute pancreatitis No date: Anemia No date: Arthritis No date: GERD (gastroesophageal reflux disease) No date: Hiatal hernia No date: Hypertension No date: Rheumatoid arthritis (HCC) No date: Sleep apnea     Comment:  uses c-pap No date: Vitamin D deficiency   Reproductive/Obstetrics                              Anesthesia Physical Anesthesia Plan  ASA: III  Anesthesia Plan: Spinal   Post-op Pain Management:    Induction:   PONV Risk Score and Plan: 2  Airway Management Planned: Natural Airway  Additional Equipment:   Intra-op Plan:   Post-operative Plan:   Informed Consent: I have reviewed the patients History and Physical, chart, labs and discussed the procedure including the risks, benefits and  alternatives for the proposed anesthesia with the patient or authorized representative who has indicated his/her understanding and acceptance.     Dental advisory given  Plan Discussed with: CRNA and Anesthesiologist  Anesthesia Plan Comments:         Anesthesia Quick Evaluation

## 2021-02-19 LAB — CBC
HCT: 29.1 % — ABNORMAL LOW (ref 36.0–46.0)
Hemoglobin: 9.6 g/dL — ABNORMAL LOW (ref 12.0–15.0)
MCH: 29.5 pg (ref 26.0–34.0)
MCHC: 33 g/dL (ref 30.0–36.0)
MCV: 89.5 fL (ref 80.0–100.0)
Platelets: 355 10*3/uL (ref 150–400)
RBC: 3.25 MIL/uL — ABNORMAL LOW (ref 3.87–5.11)
RDW: 12.7 % (ref 11.5–15.5)
WBC: 14.9 10*3/uL — ABNORMAL HIGH (ref 4.0–10.5)
nRBC: 0 % (ref 0.0–0.2)

## 2021-02-19 LAB — BASIC METABOLIC PANEL
Anion gap: 7 (ref 5–15)
BUN: 15 mg/dL (ref 8–23)
CO2: 26 mmol/L (ref 22–32)
Calcium: 8.2 mg/dL — ABNORMAL LOW (ref 8.9–10.3)
Chloride: 104 mmol/L (ref 98–111)
Creatinine, Ser: 0.73 mg/dL (ref 0.44–1.00)
GFR, Estimated: >60 mL/min (ref >60)
Glucose, Bld: 135 mg/dL — ABNORMAL HIGH (ref 70–99)
Potassium: 3.8 mmol/L (ref 3.5–5.1)
Sodium: 137 mmol/L (ref 135–145)

## 2021-02-19 MED ORDER — TRAMADOL HCL 50 MG PO TABS: 50.0000 mg | ORAL_TABLET | Freq: Four times a day (QID) | ORAL | 0 refills | Status: DC

## 2021-02-19 MED ORDER — ENOXAPARIN SODIUM 40 MG/0.4ML ~~LOC~~ SOLN: 40.0000 mg | SUBCUTANEOUS | 0 refills | Status: AC

## 2021-02-19 NOTE — Discharge Summary (Signed)
Physician Discharge Summary  Subjective: 1 Day Post-Op Procedure(s) (LRB): TOTAL HIP ARTHROPLASTY ANTERIOR APPROACH (Right) Patient reports pain as moderate.   Patient seen in rounds with Dr. Marry Guan. Patient is well, and has had no acute complaints or problems Patient is ready to go home with home health physical therapy  Physician Discharge Summary  Patient ID: Janice Rowe MRN: [id number] DOB/AGE: 65-Jan-1957 65 y.o.  Admit date: 02/18/2021 Discharge date: 02/19/2021  Admission Diagnoses:  Discharge Diagnoses:  Active Problems:   S/P hip replacement   S/P total hip arthroplasty   Discharged Condition: fair  Hospital Course: The patient is postop day 1 from an anterior approach right total hip replacement.  She had a difficult night with pain management and indigestion.  She was having trouble getting comfortable.  She did work with physical therapy and was able to move around including going to the bathroom.  She is ready to go home today after physical therapy.  Treatments: surgery:  TOTAL HIP ARTHROPLASTY ANTERIOR APPROACH (Right)  SURGEON: Laurene Footman, MD  ASSISTANTS: None  ANESTHESIA:   spinal  EBL:  Total I/O In: 1100 [I.V.:900; IV Piggyback:200] Out: 600 [Urine:300; Blood:300]  BLOOD ADMINISTERED:none  DRAINS: Incisional wound VAC   LOCAL MEDICATIONS USED:  MARCAINE    and OTHER Exparel  SPECIMEN:  Source of Specimen:  Right femoral head  DISPOSITION OF SPECIMEN:  PATHOLOGY  COUNTS:  YES  TOURNIQUET:  * No tourniquets in log *  IMPLANTS: Medacta AMIS 4 standard stem with ceramic L 28 mm head, 58 mm Mpact DM cup and liner.  Discharge Exam: Blood pressure (!) 114/57, pulse 83, temperature 99.4 F (37.4 C), resp. rate 17, height 5\' 11"  (1.803 m), weight 95.7 kg, SpO2 99 %.   Disposition: Discharge disposition: 01-Home or Self Care        Allergies as of 02/19/2021      Reactions   Miralax [polyethylene Glycol] Diarrhea, Nausea  And Vomiting   Ceftriaxone Nausea Only, Rash      Medication List    TAKE these medications   cetirizine 10 MG tablet Commonly known as: ZYRTEC Take 10 mg by mouth daily.   Cholecalciferol 100 MCG (4000 UT) Caps Take 4,000 Units by mouth daily.   clobetasol ointment 0.05 % Commonly known as: TEMOVATE Apply 1 application topically as directed. qhs to aa prn flares, avoid face, axilla What changed:   when to take this  reasons to take this   docusate sodium 100 MG capsule Commonly known as: COLACE Take 100 mg by mouth daily.   enoxaparin 40 MG/0.4ML injection Commonly known as: LOVENOX Inject 0.4 mLs (40 mg total) into the skin daily for 14 days.   Fish Oil 1200 MG Cpdr Take 1,200 mg by mouth daily.   fluticasone 50 MCG/ACT nasal spray Commonly known as: FLONASE Place 1 spray into both nostrils daily as needed for allergies or rhinitis.   folic acid 1 MG tablet Commonly known as: FOLVITE Take 1 mg by mouth daily.   gabapentin 100 MG capsule Commonly known as: NEURONTIN Take 100 mg by mouth 3 (three) times daily.   Humira 40 MG/0.8ML Pskt Generic drug: Adalimumab Inject 40 mg into the skin every 14 (fourteen) days.   melatonin 5 MG Tabs Take 5 mg by mouth at bedtime.   methotrexate 2.5 MG tablet Commonly known as: RHEUMATREX Take 12.5 mg by mouth once a week. Caution:Chemotherapy. Protect from light.   metoprolol succinate 50 MG 24 hr tablet Commonly known  as: TOPROL-XL Take 50 mg by mouth daily.   naproxen 500 MG tablet Commonly known as: NAPROSYN Take 500 mg by mouth 2 (two) times daily as needed (pain).   omeprazole 20 MG capsule Commonly known as: PRILOSEC Take 20 mg by mouth daily.   predniSONE 5 MG tablet Commonly known as: DELTASONE Take 5 mg by mouth every other day.   traMADol 50 MG tablet Commonly known as: ULTRAM Take 50 mg by mouth every 12 (twelve) hours as needed for severe pain. What changed: Another medication with the same name  was added. Make sure you understand how and when to take each.   traMADol 50 MG tablet Commonly known as: ULTRAM Take 1-2 tablets (50-100 mg total) by mouth every 6 (six) hours. What changed: You were already taking a medication with the same name, and this prescription was added. Make sure you understand how and when to take each.   Turmeric 500 MG Caps Take 500 mg by mouth daily.   venlafaxine XR 75 MG 24 hr capsule Commonly known as: EFFEXOR-XR Take 75 mg by mouth daily with breakfast.   vitamin B-12 100 MCG tablet Commonly known as: CYANOCOBALAMIN Take 100 mcg by mouth daily.   Vitamin-B Complex Tabs Take 1 tablet by mouth daily.   zolpidem 5 MG tablet Commonly known as: AMBIEN Take 5 mg by mouth at bedtime.            Durable Medical Equipment  (From admission, onward)         Start     Ordered   02/18/21 1229  DME Walker rolling  Once       Question Answer Comment  Walker: With [place] Wheels   Patient needs a walker to treat with the following condition S/P hip replacement      02/18/21 1229   02/18/21 1229  DME 3 n 1  Once        02/18/21 1229   02/18/21 1229  DME Bedside commode  Once       Question:  Patient needs a bedside commode to treat with the following condition  Answer:  S/P hip replacement   02/18/21 1229          Follow-up Information    Hessie Knows, MD. Go in 2 day(s).   Specialty: Orthopedic Surgery Why: For staple removal Contact information: [address] [place] [address] [phone]               Signed: Prescott Parma, TODD 02/19/2021, 6:51 AM   Objective: Vital signs in last 24 hours: Temp:  [96.8 F (36 C)-99.4 F (37.4 C)] 99.4 F (37.4 C) (03/26 0435) Pulse Rate:  [56-83] 83 (03/26 0435) Resp:  [5-17] 17 (03/26 0435) BP: (65-139)/(37-65) 114/57 (03/26 0435) SpO2:  [94 %-100 %] 99 % (03/26 0435) Weight:  [95.7 kg] 95.7 kg (03/25 1256)  Intake/Output from previous  day:  Intake/Output Summary (Last 24 hours) at 02/19/2021 0651 Last data filed at 02/19/2021 0534 Gross per 24 hour  Intake 4129.19 ml  Output 1500 ml  Net 2629.19 ml    Intake/Output this shift: Total I/O In: 836 [I.V.:736; IV Piggyback:100] Out: -   Labs: Recent Labs    02/18/21 1302 02/19/21 0412  HGB 9.8* 9.6*   Recent Labs    02/18/21 1302 02/19/21 0412  WBC 20.3* 14.9*  RBC 3.35* 3.25*  HCT 30.8* 29.1*  PLT 333 355   Recent Labs    02/18/21  1302 02/19/21 0412  NA  --  137  K  --  3.8  CL  --  104  CO2  --  26  BUN  --  15  CREATININE 0.72 0.73  GLUCOSE  --  135*  CALCIUM  --  8.2*   No results for input(s): LABPT, INR in the last 72 hours.  EXAM: General - Patient is Alert and Oriented Extremity - Neurovascular intact Sensation intact distally Dorsiflexion/Plantar flexion intact Compartment soft Incision - clean, dry, with the wound VAC in place Motor Function -plantarflexion and dorsiflexion are intact.  Assessment/Plan: 1 Day Post-Op Procedure(s) (LRB): TOTAL HIP ARTHROPLASTY ANTERIOR APPROACH (Right) Procedure(s) (LRB): TOTAL HIP ARTHROPLASTY ANTERIOR APPROACH (Right) Past Medical History:  Diagnosis Date  . Acute pancreatitis 11/2008  . Anemia   . Arthritis   . GERD (gastroesophageal reflux disease)   . Hiatal hernia   . Hypertension   . Rheumatoid arthritis ([facility])   . Sleep apnea    uses c-pap  . Vitamin D deficiency    Active Problems:   S/P hip replacement   S/P total hip arthroplasty  Estimated body mass index is 29.43 kg/m as calculated from the following:   Height as of this encounter: 5\' 11"  (1.803 m).   Weight as of this encounter: 95.7 kg. Advance diet Up with therapy D/C IV fluids Discharge home with home health Diet - Regular diet Follow up - in 2 weeks Activity - WBAT Disposition - Home Condition Upon Discharge - Stable DVT Prophylaxis - Lovenox  Reche Dixon, PA-C Orthopaedic Surgery 02/19/2021, 6:51 AM

## 2021-02-19 NOTE — Discharge Instructions (Signed)
ANTERIOR APPROACH TOTAL HIP REPLACEMENT POSTOPERATIVE DIRECTIONS   Hip Rehabilitation, Guidelines Following Surgery  The results of a hip operation are greatly improved after range of motion and muscle strengthening exercises. Follow all safety measures which are given to protect your hip. If any of these exercises cause increased pain or swelling in your joint, decrease the amount until you are comfortable again. Then slowly increase the exercises. Call your caregiver if you have problems or questions.   HOME CARE INSTRUCTIONS  Remove items at home which could result in a fall. This includes throw rugs or furniture in walking pathways.   ICE to the affected hip every three hours for 30 minutes at a time and then as needed for pain and swelling.  Continue to use ice on the hip for pain and swelling from surgery. You may notice swelling that will progress down to the foot and ankle.  This is normal after surgery.  Elevate the leg when you are not up walking on it.    Continue to use the breathing machine which will help keep your temperature down.  It is common for your temperature to cycle up and down following surgery, especially at night when you are not up moving around and exerting yourself.  The breathing machine keeps your lungs expanded and your temperature down.  Do not place pillow under knee, focus on keeping the knee straight while resting  DIET You may resume your previous home diet once your are discharged from the hospital.  DRESSING / WOUND CARE / SHOWERING The wound VAC will stay in place for the next 5 to 7 days.  As the system begins to constantly beep, it can be removed.  A new bandage can be applied by physical therapy  You may change your dressing 3-5 days after surgery.  Then change the dressing every day with sterile gauze.  Please use good hand washing techniques before changing the dressing.  Do not use any lotions or creams on the incision until instructed by your  surgeon. Keep your dressing dry with showering.  You can keep it covered and pat dry. Change the surgical dressing daily and reapply a dry dressing each time.  ACTIVITY Walk with your walker as instructed. Use walker as long as suggested by your caregivers. Avoid periods of inactivity such as sitting longer than an hour when not asleep. This helps prevent blood clots.  You may resume a sexual relationship in one month or when given the OK by your doctor.  You may return to work once you are cleared by your doctor.  Do not drive a car for 6 weeks or until released by you surgeon.  Do not drive while taking narcotics.  WEIGHT BEARING Weight bearing as tolerated with assist device (walker, cane, etc) as directed, use it as long as suggested by your surgeon or therapist, typically at least 4-6 weeks.  POSTOPERATIVE CONSTIPATION PROTOCOL Constipation - defined medically as fewer than three stools per week and severe constipation as less than one stool per week.  One of the most common issues patients have following surgery is constipation.  Even if you have a regular bowel pattern at home, your normal regimen is likely to be disrupted due to multiple reasons following surgery.  Combination of anesthesia, postoperative narcotics, change in appetite and fluid intake all can affect your bowels.  In order to avoid complications following surgery, here are some recommendations in order to help you during your recovery period.  Colace (docusate) -  Pick up an over-the-counter form of Colace or another stool softener and take twice a day as long as you are requiring postoperative pain medications.  Take with a full glass of water daily.  If you experience loose stools or diarrhea, hold the colace until you stool forms back up.  If your symptoms do not get better within 1 week or if they get worse, check with your doctor.  Dulcolax (bisacodyl) - Pick up over-the-counter and take as directed by the product  packaging as needed to assist with the movement of your bowels.  Take with a full glass of water.  Use this product as needed if not relieved by Colace only.   MiraLax (polyethylene glycol) - Pick up over-the-counter to have on hand.  MiraLax is a solution that will increase the amount of water in your bowels to assist with bowel movements.  Take as directed and can mix with a glass of water, juice, soda, coffee, or tea.  Take if you go more than two days without a movement. Do not use MiraLax more than once per day. Call your doctor if you are still constipated or irregular after using this medication for 7 days in a row.  If you continue to have problems with postoperative constipation, please contact the office for further assistance and recommendations.  If you experience "the worst abdominal pain ever" or develop nausea or vomiting, please contact the office immediatly for further recommendations for treatment.  ITCHING  If you experience itching with your medications, try taking only a single pain pill, or even half a pain pill at a time.  You can also use Benadryl over the counter for itching or also to help with sleep.   TED HOSE STOCKINGS Wear the elastic stockings on both legs for three weeks following surgery during the day but you may remove then at night for sleeping.  MEDICATIONS See your medication summary on the "After Visit Summary" that the nursing staff will review with you prior to discharge.  You may have some home medications which will be placed on hold until you complete the course of blood thinner medication.  It is important for you to complete the blood thinner medication as prescribed by your surgeon.  Continue your approved medications as instructed at time of discharge.  PRECAUTIONS If you experience chest pain or shortness of breath - call 911 immediately for transfer to the hospital emergency department.  If you develop a fever greater that 101 F, purulent drainage  from wound, increased redness or drainage from wound, foul odor from the wound/dressing, or calf pain - CONTACT YOUR SURGEON.                                                   FOLLOW-UP APPOINTMENTS Make sure you keep all of your appointments after your operation with your surgeon and caregivers. You should call the office at the above phone number and make an appointment for approximately two weeks after the date of your surgery or on the date instructed by your surgeon outlined in the "After Visit Summary".  RANGE OF MOTION AND STRENGTHENING EXERCISES  These exercises are designed to help you keep full movement of your hip joint. Follow your caregiver's or physical therapist's instructions. Perform all exercises about fifteen times, three times per day or as directed. Exercise both  hips, even if you have had only one joint replacement. These exercises can be done on a training (exercise) mat, on the floor, on a table or on a bed. Use whatever works the best and is most comfortable for you. Use music or television while you are exercising so that the exercises are a pleasant break in your day. This will make your life better with the exercises acting as a break in routine you can look forward to.  Lying on your back, slowly slide your foot toward your buttocks, raising your knee up off the floor. Then slowly slide your foot back down until your leg is straight again.  Lying on your back spread your legs as far apart as you can without causing discomfort.  Lying on your side, raise your upper leg and foot straight up from the floor as far as is comfortable. Slowly lower the leg and repeat.  Lying on your back, tighten up the muscle in the front of your thigh (quadriceps muscles). You can do this by keeping your leg straight and trying to raise your heel off the floor. This helps strengthen the largest muscle supporting your knee.  Lying on your back, tighten up the muscles of your buttocks both with the  legs straight and with the knee bent at a comfortable angle while keeping your heel on the floor.   IF YOU ARE TRANSFERRED TO A SKILLED REHAB FACILITY If the patient is transferred to a skilled rehab facility following release from the hospital, a list of the current medications will be sent to the facility for the patient to continue.  When discharged from the skilled rehab facility, please have the facility set up the patient's [place] prior to being released. Also, the skilled facility will be responsible for providing the patient with their medications at time of release from the facility to include their pain medication, the muscle relaxants, and their blood thinner medication. If the patient is still at the rehab facility at time of the two week follow up appointment, the skilled rehab facility will also need to assist the patient in arranging follow up appointment in our office and any transportation needs.  MAKE SURE YOU:  Understand these instructions.  Get help right away if you are not doing well or get worse.    Pick up stool softner and laxative for home use following surgery while on pain medications. Do not submerge incision under water. Please use good hand washing techniques while changing dressing each day. May shower starting three days after surgery. Please use a clean towel to pat the incision dry following showers. Continue to use ice for pain and swelling after surgery. Do not use any lotions or creams on the incision until instructed by your surgeon.

## 2021-02-19 NOTE — Progress Notes (Signed)
Physical Therapy Treatment Patient Details Name: Janice Rowe MRN: [id number] DOB: 01/27/1956 Today's Date: 02/19/2021    History of Present Illness Janice Rowe is a 65 y.o. female admitted for elective R THA anterior approach on 02/18/2021. PMH significant of: Anemia, OSA, HTN, hypoglycemia. Recently received OP PT services from September 07, 2020-11/30/2020 for R hip/RLQ pain prior to operation. Pr Dr. Rudene Christians, pt ambulating with walker "well" prior to operation.    PT Comments    The pt is educated on stair negotiation techniques this session. She presents with decreased ability to accept weight through the RLE d/t pain and weakness and will require lateral ascend with bilateral UE on handrail to safely maneuver steps in home environment. The pt demonstrates diminished aerobic capacity noted by need for lying rest breaks. She demonstrates improved ability to recover from with rest break this session when compared to other session, per family member in room. At this time the pt is expected to progress to d/c home with family care and HHPT once medically stable. She should continue to progress gait distances as she has demonstrated limited capabilities.     Follow Up Recommendations  Home health PT;Follow surgeon's recommendation for DC plan and follow-up therapies     Equipment Recommendations  None recommended by PT    Recommendations for Other Services       Precautions / Restrictions Precautions Precautions: Anterior Hip Precaution Booklet Issued: Yes (comment) Restrictions Weight Bearing Restrictions: Yes RLE Weight Bearing: Weight bearing as tolerated    Mobility  Bed Mobility Overal bed mobility: Needs Assistance Bed Mobility: Supine to Sit;Sit to Supine     Supine to sit: Min assist Sit to supine: Min assist   General bed mobility comments: Pt requiring assistance for RLE management.    Transfers Overall transfer level: Needs assistance Equipment used: Rolling walker  (2 wheeled) Transfers: Sit to/from Stand Sit to Stand: Supervision         General transfer comment: cues for safe RW mgmt  Ambulation/Gait Ambulation/Gait assistance: Min guard Gait Distance (Feet): 10 Feet Assistive device: Rolling walker (2 wheeled) Gait Pattern/deviations: Step-to pattern;Decreased stance time - right;Decreased step length - left;Decreased weight shift to right         Stairs Stairs: Yes Stairs assistance: Min assist Stair Management: One rail Right;Step to pattern Number of Stairs: 4 General stair comments: Pt demonstrating diminshed ability to safely ascend LLE without Min A for balance and safety. Educated to ascend steps laterally as she decends laterally with improved independence and safety.   Wheelchair Mobility    Modified Rankin (Stroke Patients Only)       Balance Overall balance assessment: Mild deficits observed, not formally tested Sitting-balance support: No upper extremity supported;Feet supported Sitting balance-Leahy Scale: Good     Standing balance support: Single extremity supported;During functional activity Standing balance-Leahy Scale: Fair                              Cognition Arousal/Alertness: Awake/alert Behavior During Therapy: WFL for tasks assessed/performed Overall Cognitive Status: Within Functional Limits for tasks assessed                                        Exercises Other Exercises Other Exercises: Pt and family educated re: OT role, DME recs, d/c recs, falls prevention, ECS Other Exercises: LBD, toileting, sup<>sit,  sit<>stand, sitting/standing blaance/toelrance, ~20 ft mobility    General Comments        Pertinent Vitals/Pain Pain Assessment: 0-10 Pain Score: 4  Faces Pain Scale: Hurts little more Pain Location: R hip and Ant thigh Pain Descriptors / Indicators: Tightness;Sore Pain Intervention(s): Limited activity within patient's tolerance;Monitored during  session;Ice applied    Home Living Family/patient expects to be discharged to:: Private residence Living Arrangements: Spouse/significant other Available Help at Discharge: Family Type of Home: House Home Access: Stairs to enter Entrance Stairs-Rails: Right Home Layout: One level;Full bath on main level Home Equipment: Walker - 2 wheels;Cane - single point;Grab bars - toilet;Grab bars - tub/shower;Shower seat - built in      Prior Function Level of Independence: Independent with assistive device(s)      Comments: Use of RW for home and SPC for community   PT Goals (current goals can now be found in the care plan section) Acute Rehab PT Goals Patient Stated Goal: To go home PT Goal Formulation: With patient Time For Goal Achievement: 02/20/21 Potential to Achieve Goals: Good Progress towards PT goals: Progressing toward goals    Frequency    BID      PT Plan      Co-evaluation              AM-PAC PT "6 Clicks" Mobility   Outcome Measure  Help needed turning from your back to your side while in a flat bed without using bedrails?: A Little Help needed moving from lying on your back to sitting on the side of a flat bed without using bedrails?: A Little Help needed moving to and from a bed to a chair (including a wheelchair)?: A Little Help needed standing up from a chair using your arms (e.g., wheelchair or bedside chair)?: A Little Help needed to walk in hospital room?: A Little Help needed climbing 3-5 steps with a railing? : A Little 6 Click Score: 18    End of Session   Activity Tolerance: Patient tolerated treatment well Patient left: in bed;with call bell/phone within reach;with family/visitor present;with SCD's reapplied   PT Visit Diagnosis: Unsteadiness on feet (R26.81);Other abnormalities of gait and mobility (R26.89);Muscle weakness (generalized) (M62.81);Difficulty in walking, not elsewhere classified (R26.2)     Time: 9935-7017 PT Time  Calculation (min) (ACUTE ONLY): 34 min  Charges:  $Gait Training: 23-37 mins                     10:12 AM, 02/19/21 Shaylon Gillean A. Saverio Danker PT, DPT Physical Therapist - [facility] A Jazmaine Fuelling 02/19/2021, 10:09 AM

## 2021-02-19 NOTE — Evaluation (Signed)
Occupational Therapy Evaluation Patient Details Name: Janice Rowe MRN: [id number] DOB: 1955/12/16 Today's Date: 02/19/2021    History of Present Illness Janice Rowe is a 65 y.o. female admitted for elective R THA anterior approach on 02/18/2021. PMH significant of: Anemia, OSA, HTN, hypoglycemia. Recently received OP PT services from September 07, 2020-11/30/2020 for R hip/RLQ pain prior to operation. Pr Dr. Rudene Christians, pt ambulating with walker "well" prior to operation.   Clinical Impression   Janice Rowe was seen for OT evaluation this date. Prior to hospital admission, pt was MOD I for mobility and ADLs. Pt lives c husband in home c 4 STE. Pt presents to acute OT demonstrating impaired ADL performance and functional mobility 2/2 decreased activity tolerance and functional strength/balance deficits. Pt currently requires CGA + RW for toilet t/f and perihygiene - cues for safe RW mgmt. MIN A for LBD seated EOB. Single UE support for statitc standing. Pt requires use of bed sheet for RLE mgmt sup<>sit. Pt would benefit from skilled OT to address noted impairments and functional limitations (see below for any additional details) in order to maximize safety and independence while minimizing falls risk and caregiver burden. Upon hospital discharge, recommend HHOT to maximize pt safety and return to functional independence during meaningful occupations of daily life.     Follow Up Recommendations  Home health OT    Equipment Recommendations  None recommended by OT    Recommendations for Other Services       Precautions / Restrictions Precautions Precautions: Anterior Hip Precaution Booklet Issued: No Restrictions Weight Bearing Restrictions: Yes RLE Weight Bearing: Weight bearing as tolerated      Mobility Bed Mobility Overal bed mobility: Needs Assistance Bed Mobility: Supine to Sit;Sit to Supine     Supine to sit: Supervision Sit to supine: Supervision   General bed mobility comments:  requires use of bed sheet as leg lifter sup<>sit    Transfers Overall transfer level: Needs assistance Equipment used: Rolling walker (2 wheeled) Transfers: Sit to/from Stand Sit to Stand: Min guard         General transfer comment: cues for safe RW mgmt    Balance Overall balance assessment: Needs assistance Sitting-balance support: No upper extremity supported;Feet supported Sitting balance-Leahy Scale: Good     Standing balance support: Single extremity supported;During functional activity Standing balance-Leahy Scale: Fair                             ADL either performed or assessed with clinical judgement   ADL Overall ADL's : Needs assistance/impaired                                       General ADL Comments: CGA + RW for toilet t/f and perihygiene - cues for safe RW mgmt. MIN A for LBD seated EOB. Single UE support for statitc standing                  Pertinent Vitals/Pain Pain Assessment: Faces Faces Pain Scale: Hurts little more Pain Location: R hip and Ant thigh Pain Descriptors / Indicators: Aching;Discomfort;Grimacing Pain Intervention(s): Limited activity within patient's tolerance;Premedicated before session;Repositioned     Hand Dominance     Extremity/Trunk Assessment Upper Extremity Assessment Upper Extremity Assessment: Overall WFL for tasks assessed   Lower Extremity Assessment Lower Extremity Assessment: Generalized weakness   Cervical / Trunk  Assessment Cervical / Trunk Assessment: Normal   Communication Communication Communication: No difficulties   Cognition Arousal/Alertness: Awake/alert Behavior During Therapy: WFL for tasks assessed/performed Overall Cognitive Status: Within Functional Limits for tasks assessed                                     General Comments       Exercises Exercises: Other exercises Other Exercises Other Exercises: Pt and family educated re: OT role,  DME recs, d/c recs, falls prevention, ECS Other Exercises: LBD, toileting, sup<>sit, sit<>stand, sitting/standing blaance/toelrance, ~20 ft mobility   Shoulder Instructions      Home Living Family/patient expects to be discharged to:: Private residence Living Arrangements: Spouse/significant other Available Help at Discharge: Family Type of Home: House Home Access: Stairs to enter Technical brewer of Steps: 4 Entrance Stairs-Rails: Right Home Layout: One level;Full bath on main level         Bathroom Toilet: Handicapped height Bathroom Accessibility: Yes   Home Equipment: Walker - 2 wheels;Cane - single point;Grab bars - toilet;Grab bars - tub/shower;Shower seat - built in          Prior Functioning/Environment Level of Independence: Independent with assistive device(s)        Comments: Use of RW for home and SPC for community        OT Problem List: Decreased strength;Decreased range of motion;Impaired balance (sitting and/or standing);Decreased activity tolerance;Decreased safety awareness;Decreased knowledge of use of DME or AE      OT Treatment/Interventions: Self-care/ADL training;Therapeutic exercise;Energy conservation;DME and/or AE instruction;Therapeutic activities;Patient/family education;Balance training    OT Goals(Current goals can be found in the care plan section) Acute Rehab OT Goals Patient Stated Goal: To go home OT Goal Formulation: With patient/family Time For Goal Achievement: 03/05/21 Potential to Achieve Goals: Good ADL Goals Pt Will Perform Grooming: with modified independence;standing (c LRAD PRN) Pt Will Perform Lower Body Dressing: with min assist;sitting/lateral leans Pt Will Transfer to Toilet: with modified independence;ambulating;regular height toilet (c LRAD PRN)  OT Frequency: Min 1X/week    AM-PAC OT "6 Clicks" Daily Activity     Outcome Measure Help from another person eating meals?: None Help from another person taking  care of personal grooming?: A Little Help from another person toileting, which includes using toliet, bedpan, or urinal?: A Little Help from another person bathing (including washing, rinsing, drying)?: A Little Help from another person to put on and taking off regular upper body clothing?: None Help from another person to put on and taking off regular lower body clothing?: A Little 6 Click Score: 20   End of Session Equipment Utilized During Treatment: Rolling walker  Activity Tolerance: Patient tolerated treatment well Patient left: in bed;with call bell/phone within reach;with family/visitor present;Other (comment) (PT in room)  OT Visit Diagnosis: Unsteadiness on feet (R26.81);Other abnormalities of gait and mobility (R26.89)                Time: 1308-6578 OT Time Calculation (min): 11 min Charges:  OT General Charges $OT Visit: 1 Visit OT Evaluation $OT Eval Low Complexity: 1 Low  Janice Rowe, M.S. OTR/L  02/19/21, 9:46 AM  ascom [phone]

## 2021-02-19 NOTE — Progress Notes (Signed)
Patient discharging home, intstructions given to patient, verbalized understanding. Family transporting patient home.

## 2021-02-19 NOTE — Progress Notes (Signed)
  Subjective: 1 Day Post-Op Procedure(s) (LRB): TOTAL HIP ARTHROPLASTY ANTERIOR APPROACH (Right) Patient reports pain as moderate.   Patient is well, and has had no acute complaints or problems Plan is to go Home after hospital stay. Negative for chest pain and shortness of breath Fever: Mild 99.4 Gastrointestinal: Positive for mild nausea but no vomiting  Objective: Vital signs in last 24 hours: Temp:  [96.8 F (36 C)-99.4 F (37.4 C)] 99.4 F (37.4 C) (03/26 0435) Pulse Rate:  [56-83] 83 (03/26 0435) Resp:  [5-17] 17 (03/26 0435) BP: (65-139)/(37-65) 114/57 (03/26 0435) SpO2:  [94 %-100 %] 99 % (03/26 0435) Weight:  [95.7 kg] 95.7 kg (03/25 1256)  Intake/Output from previous day:  Intake/Output Summary (Last 24 hours) at 02/19/2021 0646 Last data filed at 02/19/2021 0534 Gross per 24 hour  Intake 4129.19 ml  Output 1500 ml  Net 2629.19 ml    Intake/Output this shift: Total I/O In: 836 [I.V.:736; IV Piggyback:100] Out: -   Labs: Recent Labs    02/18/21 1302 02/19/21 0412  HGB 9.8* 9.6*   Recent Labs    02/18/21 1302 02/19/21 0412  WBC 20.3* 14.9*  RBC 3.35* 3.25*  HCT 30.8* 29.1*  PLT 333 355   Recent Labs    02/18/21 1302 02/19/21 0412  NA  --  137  K  --  3.8  CL  --  104  CO2  --  26  BUN  --  15  CREATININE 0.72 0.73  GLUCOSE  --  135*  CALCIUM  --  8.2*   No results for input(s): LABPT, INR in the last 72 hours.   EXAM General - Patient is Alert and Oriented Extremity - Neurovascular intact Sensation intact distally Dorsiflexion/Plantar flexion intact Compartment soft Dressing/Incision - clean, dry, with the wound VAC in place Motor Function - intact, moving foot and toes well on exam.   Past Medical History:  Diagnosis Date  . Acute pancreatitis 11/2008  . Anemia   . Arthritis   . GERD (gastroesophageal reflux disease)   . Hiatal hernia   . Hypertension   . Rheumatoid arthritis ([place])   . Sleep apnea    uses c-pap  . Vitamin  D deficiency     Assessment/Plan: 1 Day Post-Op Procedure(s) (LRB): TOTAL HIP ARTHROPLASTY ANTERIOR APPROACH (Right) Active Problems:   S/P hip replacement   S/P total hip arthroplasty  Estimated body mass index is 29.43 kg/m as calculated from the following:   Height as of this encounter: 5\' 11"  (1.803 m).   Weight as of this encounter: 95.7 kg. Advance diet Up with therapy D/C IV fluids Discharge home with home health  DVT Prophylaxis - Lovenox, Foot Pumps and TED hose Weight-Bearing as tolerated to right leg  Reche Dixon, PA-C Orthopaedic Surgery 02/19/2021, 6:46 AM

## 2021-02-21 ENCOUNTER — Encounter: Payer: Self-pay | Admitting: Orthopedic Surgery

## 2021-02-21 LAB — SURGICAL PATHOLOGY

## 2021-02-25 NOTE — Anesthesia Postprocedure Evaluation (Signed)
Anesthesia Post Note  Patient: Janice Rowe  Procedure(s) Performed: TOTAL HIP ARTHROPLASTY ANTERIOR APPROACH (Right Hip)  Patient location during evaluation: PACU Anesthesia Type: Spinal Level of consciousness: awake and alert and oriented Pain management: pain level controlled Vital Signs Assessment: post-procedure vital signs reviewed and stable Respiratory status: spontaneous breathing, nonlabored ventilation and respiratory function stable Cardiovascular status: blood pressure returned to baseline and stable Postop Assessment: no signs of nausea or vomiting Anesthetic complications: no   No complications documented.   Last Vitals:  Vitals:   02/19/21 0730 02/19/21 1003  BP: (!) 142/65 118/72  Pulse: 84   Resp: 17   Temp: 37.7 C   SpO2: 99%     Last Pain:  Vitals:   02/19/21 0851  TempSrc:   PainSc: 1                  Destony Prevost

## 2021-05-31 ENCOUNTER — Other Ambulatory Visit: Payer: Self-pay

## 2021-05-31 ENCOUNTER — Ambulatory Visit: Payer: Medicare PPO | Admitting: Dermatology

## 2021-05-31 DIAGNOSIS — L900 Lichen sclerosus et atrophicus: Secondary | ICD-10-CM | POA: Diagnosis not present

## 2021-05-31 DIAGNOSIS — L578 Other skin changes due to chronic exposure to nonionizing radiation: Secondary | ICD-10-CM

## 2021-05-31 DIAGNOSIS — D1800 Hemangioma unspecified site: Secondary | ICD-10-CM

## 2021-05-31 DIAGNOSIS — D229 Melanocytic nevi, unspecified: Secondary | ICD-10-CM

## 2021-05-31 DIAGNOSIS — L814 Other melanin hyperpigmentation: Secondary | ICD-10-CM

## 2021-05-31 DIAGNOSIS — Z1283 Encounter for screening for malignant neoplasm of skin: Secondary | ICD-10-CM | POA: Diagnosis not present

## 2021-05-31 DIAGNOSIS — D225 Melanocytic nevi of trunk: Secondary | ICD-10-CM

## 2021-05-31 DIAGNOSIS — D239 Other benign neoplasm of skin, unspecified: Secondary | ICD-10-CM

## 2021-05-31 DIAGNOSIS — L905 Scar conditions and fibrosis of skin: Secondary | ICD-10-CM

## 2021-05-31 DIAGNOSIS — L821 Other seborrheic keratosis: Secondary | ICD-10-CM

## 2021-05-31 DIAGNOSIS — L918 Other hypertrophic disorders of the skin: Secondary | ICD-10-CM

## 2021-05-31 DIAGNOSIS — D235 Other benign neoplasm of skin of trunk: Secondary | ICD-10-CM | POA: Diagnosis not present

## 2021-05-31 NOTE — Progress Notes (Signed)
Follow-Up Visit   Subjective  Janice Rowe is a 65 y.o. female who presents for the following: Total body skin exam (Check spot R abdomen, gets rough, check blackhead intermammary) and Lichen Sclerosus et atrophicus (Perianal, labia, not had to use any clobetasol in ~91m).   The following portions of the chart were reviewed this encounter and updated as appropriate:        Review of Systems:  No other skin or systemic complaints except as noted in HPI or Assessment and Plan.  Objective  Well appearing patient in no apparent distress; mood and affect are within normal limits.  A full examination was performed including scalp, head, eyes, ears, nose, lips, neck, chest, axillae, abdomen, back, buttocks, bilateral upper extremities, bilateral lower extremities, hands, feet, fingers, toes, fingernails, and toenails. All findings within normal limits unless otherwise noted below.  perianal, labia Clear today per patient, pt declines exam since no symptoms and not using topical steroid medication    Right Lower Back 2.85mm med dark brown macule  L inframammary Dilated pore 3.36mm  R hip/upper thigh Linear violaceous plaque   Assessment & Plan   Lentigines - Scattered tan macules - Due to sun exposure - Benign-appering, observe - Recommend daily broad spectrum sunscreen SPF 30+ to sun-exposed areas, reapply every 2 hours as needed. - Call for any changes  Seborrheic Keratoses - Stuck-on, waxy, tan-brown papules and/or plaques  - Benign-appearing - Discussed benign etiology and prognosis. - Observe - Call for any changes - R abdomen  Melanocytic Nevi - Tan-brown and/or pink-flesh-colored symmetric macules and papules - Benign appearing on exam today - Observation - Call clinic for new or changing moles - Recommend daily use of broad spectrum spf 30+ sunscreen to sun-exposed areas.   Hemangiomas - Red papules - Discussed benign nature - Observe - Call for any  changes  Actinic Damage - Chronic condition, secondary to cumulative UV/sun exposure - diffuse scaly erythematous macules with underlying dyspigmentation - Recommend daily broad spectrum sunscreen SPF 30+ to sun-exposed areas, reapply every 2 hours as needed.  - Staying in the shade or wearing long sleeves, sun glasses (UVA+UVB protection) and wide brim hats (4-inch brim around the entire circumference of the hat) are also recommended for sun protection.  - Call for new or changing lesions.  Acrochordons (Skin Tags) - Fleshy, skin-colored pedunculated papules - Benign appearing.  - Observe. - If desired, they can be removed with an in office procedure that is not covered by insurance. - Please call the clinic if you notice any new or changing lesions.   Skin cancer screening performed today.  Lichen sclerosus et atrophicus perianal, labia  Clear today per patient  No treatment at this time, if flares may restart Clobetasol oint qd/bid for 2 weeks, then qd up to 5d/wk, pt has  Topical steroids (such as triamcinolone, fluocinolone, fluocinonide, mometasone, clobetasol, halobetasol, betamethasone, hydrocortisone) can cause thinning and lightening of the skin if they are used for too long in the same area. Your physician has selected the right strength medicine for your problem and area affected on the body. Please use your medication only as directed by your physician to prevent side effects.    Related Medications clobetasol ointment (TEMOVATE) 4.58 % Apply 1 application topically as directed. qhs to aa prn flares, avoid face, axilla  Nevus Right Lower Back  Benign-appearing.  Observation.  Call clinic for new or changing moles.  Recommend daily use of broad spectrum spf 30+ sunscreen to  sun-exposed areas.    Dilated pore of Winer L inframammary  Benign, observe  Discussed removing with punch excision if symptomatic  Scar R hip/upper thigh  2ndary to hip replacement  surgery Benign   Return in about 1 year (around 05/31/2022) for TBSE, h/o LS&A.  I, Othelia Pulling, RMA, am acting as scribe for Brendolyn Patty, MD .  Documentation: I have reviewed the above documentation for accuracy and completeness, and I agree with the above.  Brendolyn Patty MD

## 2021-05-31 NOTE — Patient Instructions (Signed)

## 2021-08-24 ENCOUNTER — Other Ambulatory Visit: Payer: Self-pay | Admitting: Internal Medicine

## 2021-08-24 DIAGNOSIS — Z1231 Encounter for screening mammogram for malignant neoplasm of breast: Secondary | ICD-10-CM

## 2021-09-08 ENCOUNTER — Other Ambulatory Visit: Payer: Self-pay

## 2021-09-08 ENCOUNTER — Ambulatory Visit
Admission: RE | Admit: 2021-09-08 | Discharge: 2021-09-08 | Disposition: A | Discharge status: Home / Self Care | Payer: Medicare PPO | Source: Ambulatory Visit | Attending: Internal Medicine | Admitting: Internal Medicine

## 2021-09-08 DIAGNOSIS — Z1231 Encounter for screening mammogram for malignant neoplasm of breast: Secondary | ICD-10-CM | POA: Diagnosis present

## 2022-05-05 IMAGING — CT CT ABD-PELV W/ CM
1 of 3 series · 14 of 32 positions shown, 19 images · IV contrast (omnipaque)
Comparison: 01/12/2014

CLINICAL DATA: Diarrhea, weight loss

EXAM:
CT ABDOMEN AND PELVIS WITH CONTRAST
TECHNIQUE: Multidetector CT imaging of the abdomen and pelvis was performed
using the standard protocol following bolus administration of
intravenous contrast.
CONTRAST:  100mL OMNIPAQUE IOHEXOL 300 MG/ML  SOLN

[Series 2: axial st · axial · 0.80mm/px · z∈[-1095,-635]mm · 14 of 106 slices shown, 19 images]
[im 7/106  soft-tissue]
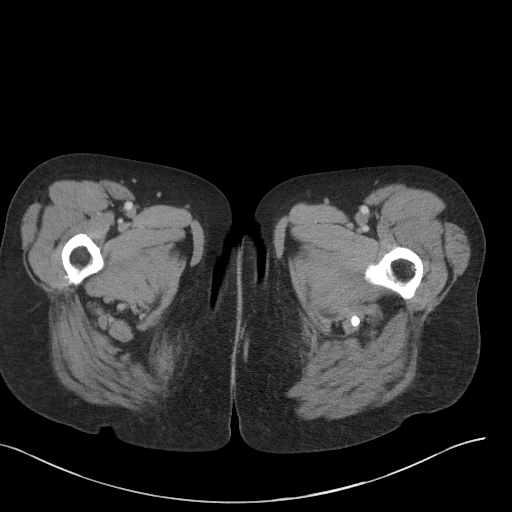
[im 7/106  bone]
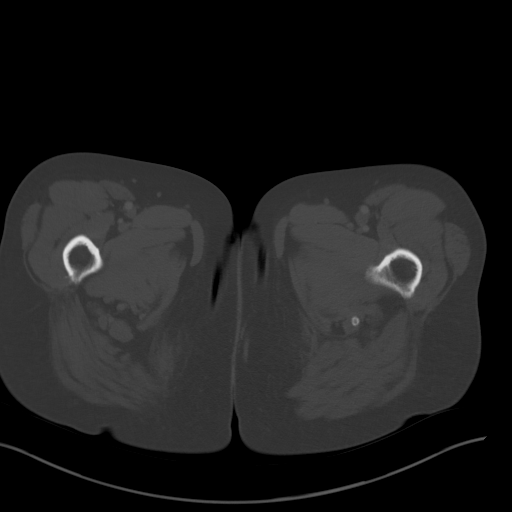
[im 13/106  soft-tissue]
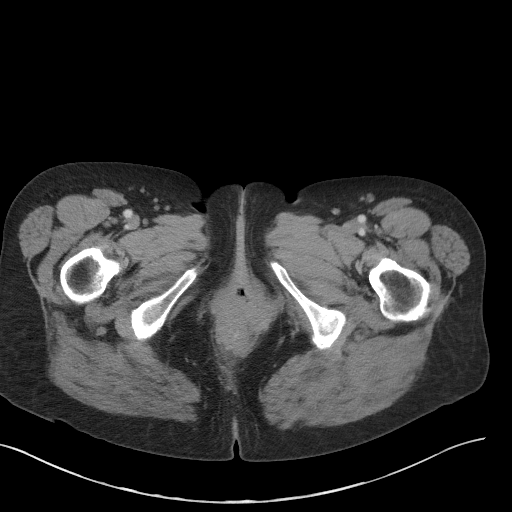
[im 25/106  soft-tissue]
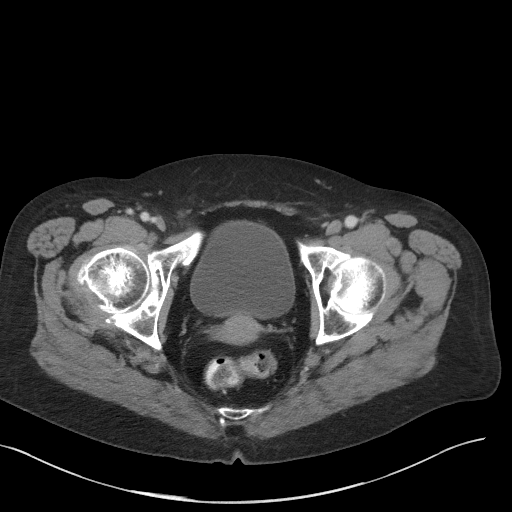
[im 31/106  soft-tissue]
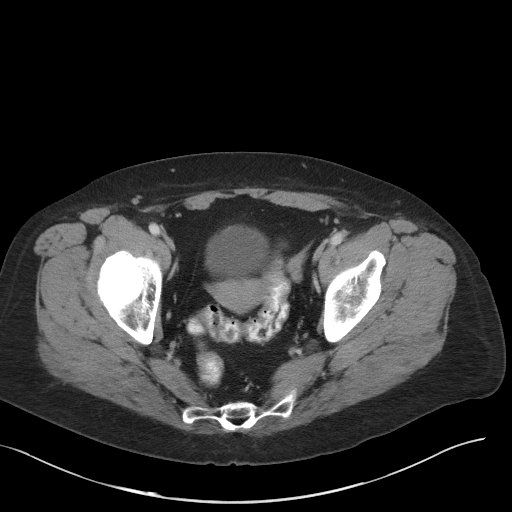
[im 38/106  soft-tissue]
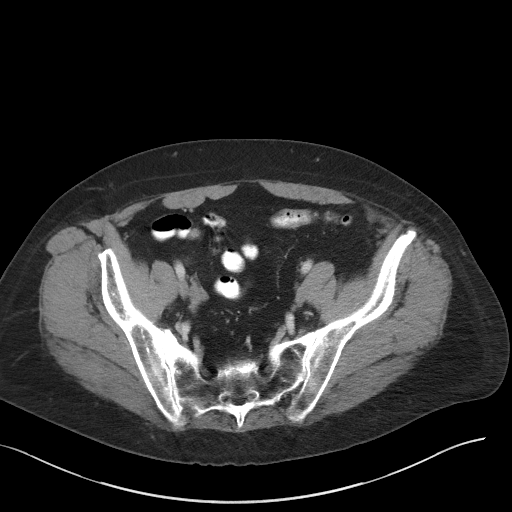
[im 44/106  soft-tissue]
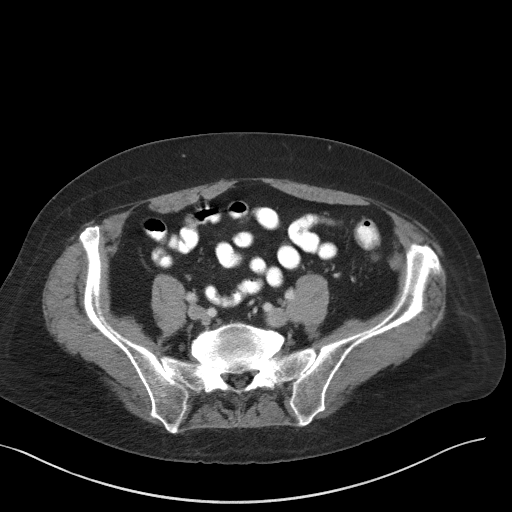
[im 56/106  soft-tissue]
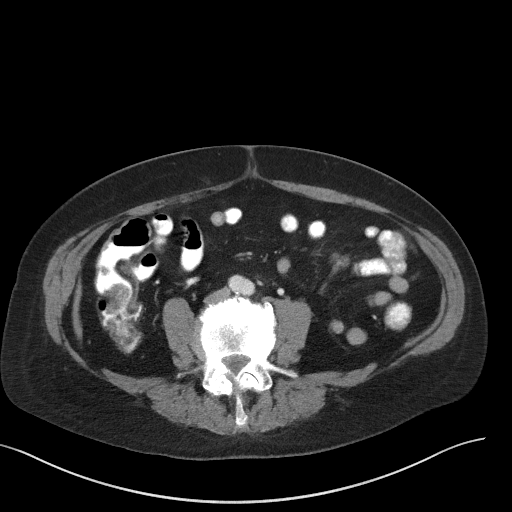
[im 62/106  soft-tissue]
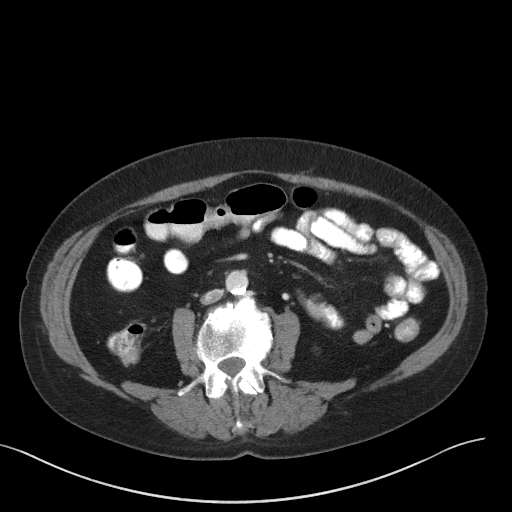
[im 68/106  soft-tissue]
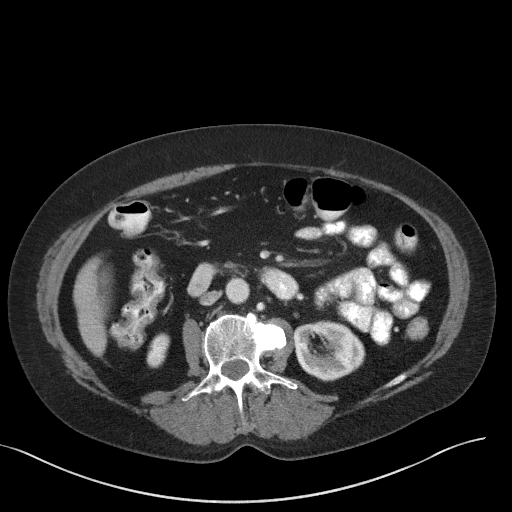
[im 68/106  bone]
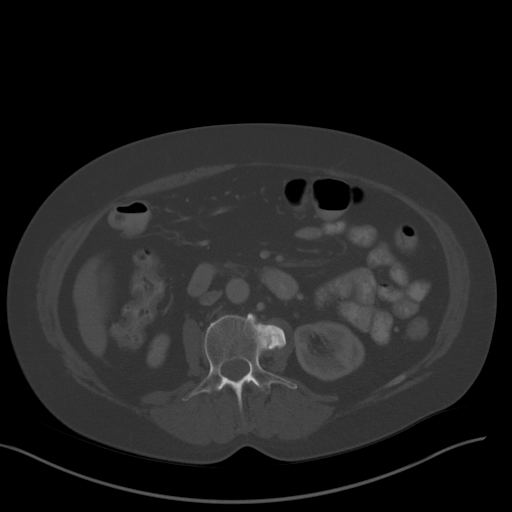
[im 75/106  soft-tissue]
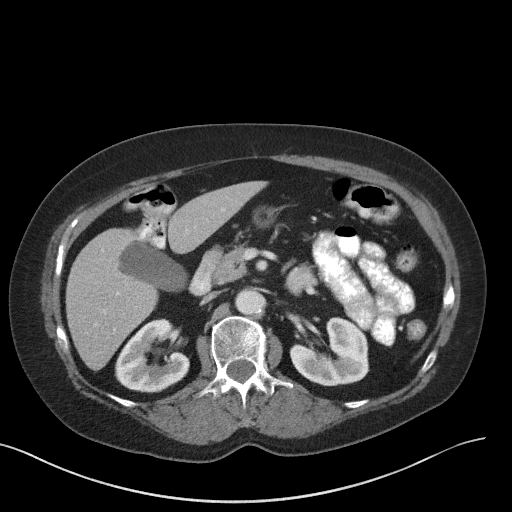
[im 81/106  soft-tissue]
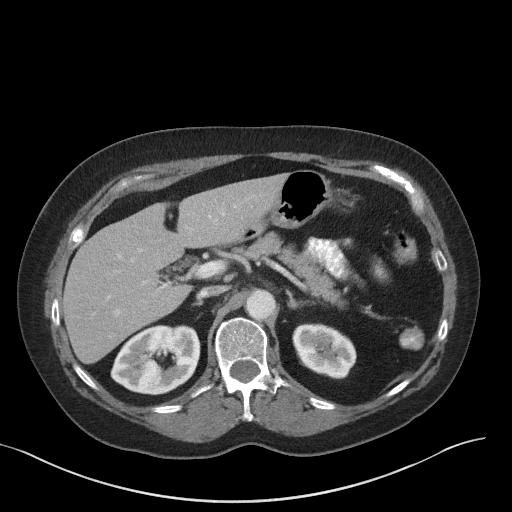
[im 81/106  lung]
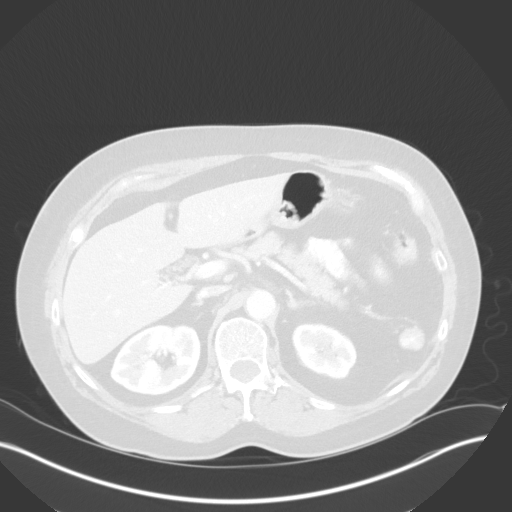
[im 87/106  lung]
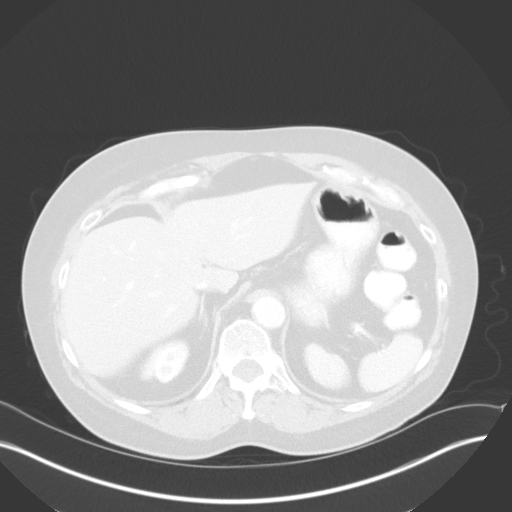
[im 93/106  soft-tissue]
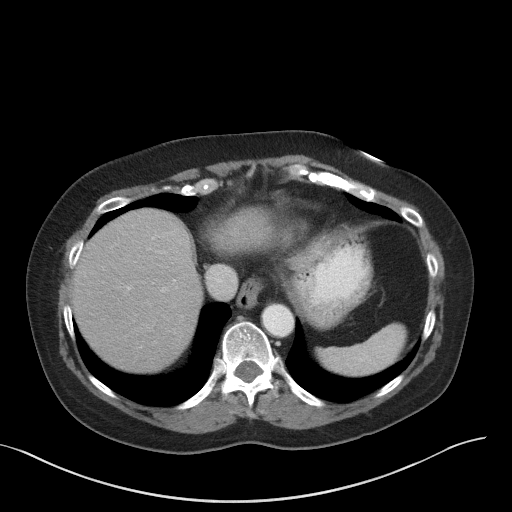
[im 93/106  lung]
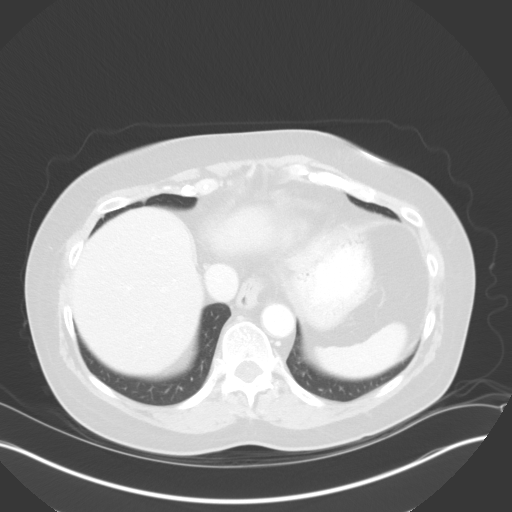
[im 99/106  soft-tissue]
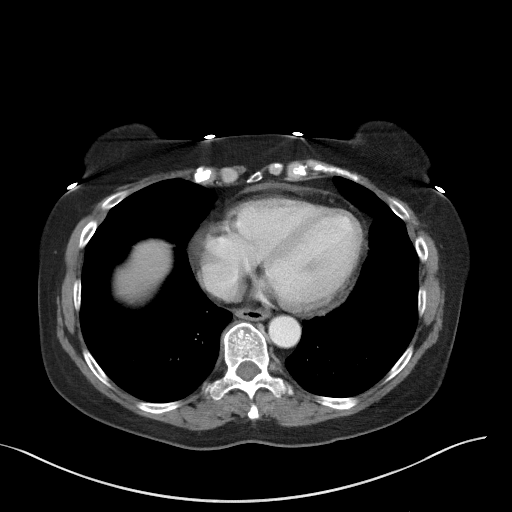
[im 99/106  lung]
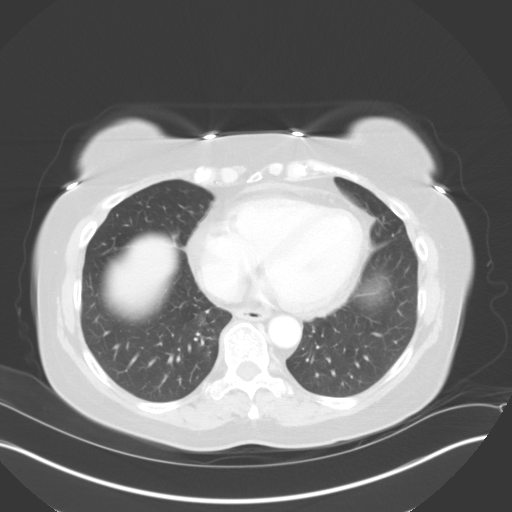

[14 of 32 positions shown; findings below may reference images not displayed]

FINDINGS: Lower chest: Small pericardial effusion. Lung bases clear. No
pleural effusions.

Hepatobiliary: No focal hepatic abnormality. Gallbladder
unremarkable.

Pancreas: No focal abnormality or ductal dilatation.

Spleen: No focal abnormality.  Normal size.

Adrenals/Urinary Tract: No adrenal abnormality. No focal renal
abnormality. No stones or hydronephrosis. Urinary bladder is
unremarkable.

Stomach/Bowel: Normal appendix. Stomach, large and small bowel
grossly unremarkable.

Vascular/Lymphatic: Aortic atherosclerosis. No evidence of aneurysm
or adenopathy.

Reproductive: Uterus and adnexa unremarkable. Soft tissue fullness
noted in the region of the right side of the vagina. This may be
normal asymmetric fullness, but recommend direct visualization to
exclude vaginal/vulvar mass.

Other: No free fluid or free air.

Musculoskeletal: No acute bony abnormality. Degenerative changes in
the lumbar spine.
IMPRESSION: Asymmetric soft tissue fullness in the region of the right side of
the vagina/vulvar region. Recommend direct visualization to exclude
mass.

Aortic atherosclerosis.

Small pericardial effusion.  No pleural effusion.

## 2022-05-31 ENCOUNTER — Ambulatory Visit: Payer: Medicare PPO | Admitting: Dermatology

## 2022-05-31 DIAGNOSIS — S80862A Insect bite (nonvenomous), left lower leg, initial encounter: Secondary | ICD-10-CM

## 2022-05-31 DIAGNOSIS — L814 Other melanin hyperpigmentation: Secondary | ICD-10-CM

## 2022-05-31 DIAGNOSIS — D1800 Hemangioma unspecified site: Secondary | ICD-10-CM

## 2022-05-31 DIAGNOSIS — L900 Lichen sclerosus et atrophicus: Secondary | ICD-10-CM | POA: Diagnosis not present

## 2022-05-31 DIAGNOSIS — L820 Inflamed seborrheic keratosis: Secondary | ICD-10-CM

## 2022-05-31 DIAGNOSIS — L74519 Primary focal hyperhidrosis, unspecified: Secondary | ICD-10-CM

## 2022-05-31 DIAGNOSIS — W57XXXA Bitten or stung by nonvenomous insect and other nonvenomous arthropods, initial encounter: Secondary | ICD-10-CM | POA: Diagnosis not present

## 2022-05-31 DIAGNOSIS — D229 Melanocytic nevi, unspecified: Secondary | ICD-10-CM

## 2022-05-31 DIAGNOSIS — I8393 Asymptomatic varicose veins of bilateral lower extremities: Secondary | ICD-10-CM

## 2022-05-31 DIAGNOSIS — L578 Other skin changes due to chronic exposure to nonionizing radiation: Secondary | ICD-10-CM

## 2022-05-31 DIAGNOSIS — L720 Epidermal cyst: Secondary | ICD-10-CM

## 2022-05-31 DIAGNOSIS — Z1283 Encounter for screening for malignant neoplasm of skin: Secondary | ICD-10-CM | POA: Diagnosis not present

## 2022-05-31 DIAGNOSIS — L821 Other seborrheic keratosis: Secondary | ICD-10-CM

## 2022-05-31 NOTE — Patient Instructions (Addendum)
Recommend Zeasorb AF powder over the counter to help absorb sweating.    Seborrheic Keratosis  What causes seborrheic keratoses? Seborrheic keratoses are harmless, common skin growths that first appear during adult life.  As time goes by, more growths appear.  Some people may develop a large number of them.  Seborrheic keratoses appear on both covered and uncovered body parts.  They are not caused by sunlight.  The tendency to develop seborrheic keratoses can be inherited.  They vary in color from skin-colored to gray, brown, or even black.  They can be either smooth or have a rough, warty surface.   Seborrheic keratoses are superficial and look as if they were stuck on the skin.  Under the microscope this type of keratosis looks like layers upon layers of skin.  That is why at times the top layer may seem to fall off, but the rest of the growth remains and re-grows.    Treatment Seborrheic keratoses do not need to be treated, but can easily be removed in the office.  Seborrheic keratoses often cause symptoms when they rub on clothing or jewelry.  Lesions can be in the way of shaving.  If they become inflamed, they can cause itching, soreness, or burning.  Removal of a seborrheic keratosis can be accomplished by freezing, burning, or surgery. If any spot bleeds, scabs, or grows rapidly, please return to have it checked, as these can be an indication of a skin cancer.  Due to recent changes in healthcare laws, you may see results of your pathology and/or laboratory studies on MyChart before the doctors have had a chance to review them. We understand that in some cases there may be results that are confusing or concerning to you. Please understand that not all results are received at the same time and often the doctors may need to interpret multiple results in order to provide you with the best plan of care or course of treatment. Therefore, we ask that you please give [place] 2 business days to thoroughly  review all your results before contacting the office for clarification. Should we see a critical lab result, you will be contacted sooner.   If You Need Anything After Your Visit  If you have any questions or concerns for your doctor, please call our main line at [phone] and press option 4 to reach your doctor's medical assistant. If no one answers, please leave a voicemail as directed and we will return your call as soon as possible. Messages left after 4 pm will be answered the following business day.   You may also send [place] a message via [place]. We typically respond to MyChart messages within 1-2 business days.  For prescription refills, please ask your pharmacy to contact our office. Our fax number is [phone].  If you have an urgent issue when the clinic is closed that cannot wait until the next business day, you can page your doctor at the number below.    Please note that while we do our best to be available for urgent issues outside of office hours, we are not available 24/7.   If you have an urgent issue and are unable to reach [place], you may choose to seek medical care at your doctor's office, retail clinic, urgent care center, or emergency room.  If you have a medical emergency, please immediately call 911 or go to the emergency department.  Pager Numbers  - Dr. Nehemiah Massed: [phone]  - Dr. Laurence Ferrari: [phone]  - Dr. Nicole Kindred:  [phone]  In the event of inclement weather, please call our main line at [phone] for an update on the status of any delays or closures.  Dermatology Medication Tips: Please keep the boxes that topical medications come in in order to help keep track of the instructions about where and how to use these. Pharmacies typically print the medication instructions only on the boxes and not directly on the medication tubes.   If your medication is too expensive, please contact our office at [phone] option 4 or send [place] a message through  [place].   We are unable to tell what your co-pay for medications will be in advance as this is different depending on your insurance coverage. However, we may be able to find a substitute medication at lower cost or fill out paperwork to get insurance to cover a needed medication.   If a prior authorization is required to get your medication covered by your insurance company, please allow [place] 1-2 business days to complete this process.  Drug prices often vary depending on where the prescription is filled and some pharmacies may offer cheaper prices.  The website www.goodrx.com contains coupons for medications through different pharmacies. The prices here do not account for what the cost may be with help from insurance (it may be cheaper with your insurance), but the website can give you the price if you did not use any insurance.  - You can print the associated coupon and take it with your prescription to the pharmacy.  - You may also stop by our office during regular business hours and pick up a GoodRx coupon card.  - If you need your prescription sent electronically to a different pharmacy, notify our office through [facility] or by phone at [phone] option 4.     Si Usted Necesita Algo Despus de Su Visita  Tambin puede enviarnos un mensaje a travs de Pharmacist, community. Por lo general respondemos a los mensajes de MyChart en el transcurso de 1 a 2 das hbiles.  Para renovar recetas, por favor pida a su farmacia que se ponga en contacto con nuestra oficina. Harland Dingwall de fax es [place] [phone].  Si tiene un asunto urgente cuando la clnica est cerrada y que no puede esperar hasta el siguiente da hbil, puede llamar/localizar a su doctor(a) al nmero que aparece a continuacin.   Por favor, tenga en cuenta que aunque hacemos todo lo posible para estar disponibles para asuntos urgentes fuera del horario de [place], no estamos disponibles las 24 horas del da, los 7 das de la  [place].   Si tiene un problema urgente y no puede comunicarse con nosotros, puede optar por buscar atencin mdica  en el consultorio de su doctor(a), en una clnica privada, en un centro de atencin urgente o en una sala de emergencias.  Si tiene Engineering geologist, por favor llame inmediatamente al 911 o vaya a la sala de emergencias.  Nmeros de bper  - Dr. Nehemiah Massed: [phone]  - Dra. Moye: [phone]  - Dra. Nicole Kindred: [phone]  En caso de inclemencias del [place], por favor llame a Johnsie Kindred principal al [phone] para una actualizacin sobre el [place] de cualquier retraso o cierre.  Consejos para la medicacin en dermatologa: Por favor, guarde las cajas en las que vienen los medicamentos de uso tpico para ayudarle a seguir las instrucciones sobre dnde y cmo usarlos. Las farmacias generalmente imprimen las instrucciones del medicamento slo en las cajas y no directamente en los tubos del [place].  Si su medicamento es muy caro, por favor, pngase en contacto con Zigmund Daniel llamando al [phone] y presione la opcin 4 o envenos un mensaje a travs de Pharmacist, community.   No podemos decirle cul ser su copago por los medicamentos por adelantado ya que esto es diferente dependiendo de la cobertura de su seguro. Sin embargo, es posible que podamos encontrar un medicamento sustituto a Electrical engineer un formulario para que el seguro cubra el medicamento que se considera necesario.   Si se requiere una autorizacin previa para que su compaa de seguros [place] su medicamento, por favor permtanos de 1 a 2 das hbiles para completar este proceso.  Los precios de los medicamentos varan con frecuencia dependiendo del Environmental consultant de dnde se surte la receta y alguna farmacias pueden ofrecer precios ms baratos.  El sitio web www.goodrx.com tiene cupones para medicamentos de Airline pilot. Los precios aqu no tienen en cuenta lo que podra costar con la ayuda del  seguro (puede ser ms barato con su seguro), pero el sitio web puede darle el precio si no utiliz Research scientist (physical sciences).  - Puede imprimir el cupn correspondiente y llevarlo con su receta a la farmacia.  - Tambin puede pasar por nuestra oficina durante el horario de atencin regular y Charity fundraiser una tarjeta de cupones de GoodRx.  - Si necesita que su receta se enve electrnicamente a una farmacia diferente, informe a nuestra oficina a travs de MyChart de [place] o por telfono llamando al [phone] y presione la opcin 4.

## 2022-05-31 NOTE — Progress Notes (Signed)
Follow-Up Visit   Subjective  Janice Rowe is a 66 y.o. female who presents for the following: Annual Exam (Patient c/o irregular lesion on the L breast that she is concerned about and would like checked today). The patient presents for Total-Body Skin Exam (TBSE) for skin cancer screening and mole check.  The patient has spots, moles and lesions to be evaluated, some may be new or changing and the patient has concerns that these could be cancer.   The following portions of the chart were reviewed this encounter and updated as appropriate:       Review of Systems:  No other skin or systemic complaints except as noted in HPI or Assessment and Plan.  Objective  Well appearing patient in no apparent distress; mood and affect are within normal limits.  A full examination was performed including scalp, head, eyes, ears, nose, lips, neck, chest, axillae, abdomen, back, buttocks, bilateral upper extremities, bilateral lower extremities, hands, feet, fingers, toes, fingernails, and toenails. All findings within normal limits unless otherwise noted below.  Vaginal labia Hypopigmented patches of the B/L medial labia majora/upper labia minora with focal hypopigmented macules inferior. No erythema. No erosion, no purpura  L pretibia x 1, L med breast x 1 (2) Erythematous stuck-on, waxy papule  L calf x 3 Pink papules x 3  R perineal crease Stuck-on, waxy, tan-brown papule --Discussed benign etiology and prognosis.   Scalp, groin Excessive sweating.     Assessment & Plan  Lichen sclerosus et atrophicus Vaginal labia  Chronic condition with duration or expected duration over one year. Currently well-controlled.   Lichen sclerosus is a chronic inflammatory condition of unknown cause that frequently involves the vaginal area and less commonly extragenital skin, and is NOT sexually transmitted. It frequently causes symptoms of pain and burning.  It requires regular monitoring and  treatment with topical steroids to minimize inflammation and to reduce risk of scarring. There is also a risk of cancer in the vaginal area which is very low if inflammation is well controlled. Regular checks of the area are recommended. Please call if you notice any new or changing spots within this area.  Continue Clobetasol 0.05% ointment to aa's vaginal area QD-BID PRN itch/burn/pain. Avoid inguinal/perineal creases.    Topical steroids (such as triamcinolone, fluocinolone, fluocinonide, mometasone, clobetasol, halobetasol, betamethasone, hydrocortisone) can cause thinning and lightening of the skin if they are used for too long in the same area. Your physician has selected the right strength medicine for your problem and area affected on the body. Please use your medication only as directed by your physician to prevent side effects.    Related Medications clobetasol ointment (TEMOVATE) 3.21 % Apply 1 application topically as directed. qhs to aa prn flares, avoid face, axilla  Inflamed seborrheic keratosis (2) L pretibia x 1, L med breast x 1  Destruction of lesion - L pretibia x 1, L med breast x 1  Destruction method: cryotherapy   Informed consent: discussed and consent obtained   Lesion destroyed using liquid nitrogen: Yes   Region frozen until ice ball extended beyond lesion: Yes   Outcome: patient tolerated procedure well with no complications   Post-procedure details: wound care instructions given   Additional details:  Prior to procedure, discussed risks of blister formation, small wound, skin dyspigmentation, or rare scar following cryotherapy. Recommend Vaseline ointment to treated areas while healing.   Bug bite, initial encounter L calf x 3  Benign-appearing.  No tx needed.  Seborrheic keratosis R perineal crease  Reassured benign age-related growth.  Recommend observation.  Discussed cryotherapy if spot(s) become irritated or inflamed.  Primary focal  hyperhidrosis Scalp, groin  Discussed oral robinul but patient works outside a lot and that is when she sweats most, so it would not be safe to use. Recommend Zeasorb AF powder to help absorb moisture.    Lentigines - Scattered tan macules - Due to sun exposure - Benign-appearing, observe - Recommend daily broad spectrum sunscreen SPF 30+ to sun-exposed areas, reapply every 2 hours as needed. - Call for any changes  Seborrheic Keratoses - Stuck-on, waxy, tan-brown papules and/or plaques  - Benign-appearing - Discussed benign etiology and prognosis. - Observe - Call for any changes  Melanocytic Nevi - Tan-brown and/or pink-flesh-colored symmetric macules and papules - Benign appearing on exam today - Observation - Call clinic for new or changing moles - Recommend daily use of broad spectrum spf 30+ sunscreen to sun-exposed areas.   Hemangiomas - Red papules - Discussed benign nature - Observe - Call for any changes  Actinic Damage - Chronic condition, secondary to cumulative UV/sun exposure - diffuse scaly erythematous macules with underlying dyspigmentation - Recommend daily broad spectrum sunscreen SPF 30+ to sun-exposed areas, reapply every 2 hours as needed.  - Staying in the shade or wearing long sleeves, sun glasses (UVA+UVB protection) and wide brim hats (4-inch brim around the entire circumference of the hat) are also recommended for sun protection.  - Call for new or changing lesions.  Varicose Veins/Spider Veins - Dilated blue, purple or red veins at the lower extremities - Reassured - Smaller vessels can be treated by sclerotherapy (a procedure to inject a medicine into the veins to make them disappear) if desired, but the treatment is not covered by insurance. Larger vessels may be covered if symptomatic and we would refer to vascular surgeon if treatment desired.  Milia - tiny firm white papules - type of cyst - benign - may be extracted if symptomatic -  observe  Skin cancer screening performed today.  Return in about 1 year (around 06/01/2023) for TBSE.  Luther Redo, CMA, am acting as scribe for Brendolyn Patty, MD .  Documentation: I have reviewed the above documentation for accuracy and completeness, and I agree with the above.  Brendolyn Patty MD

## 2022-06-03 IMAGING — US US PELVIS COMPLETE WITH TRANSVAGINAL
1 series · 13 of 25 positions shown · non-contrast
Comparison: CT abdomen pelvis, 08/12/2020

CLINICAL DATA: Pelvic fullness, right lower quadrant pain

EXAM:
TRANSABDOMINAL AND TRANSVAGINAL ULTRASOUND OF PELVIS
DOPPLER ULTRASOUND OF OVARIES
TECHNIQUE: Both transabdominal and transvaginal ultrasound examinations of the
pelvis were performed. Transabdominal technique was performed for
global imaging of the pelvis including uterus, ovaries, adnexal
regions, and pelvic cul-de-sac.
It was necessary to proceed with endovaginal exam following the
transabdominal exam to visualize the uterus, endometrium, ovaries,
and adnexa. Color and duplex Doppler ultrasound was utilized to
evaluate blood flow to the ovaries.

[Series 1: us pelvis complete with transvaginal · 0.24mm/px · 13 of 44 slices shown]
[im 1/44]
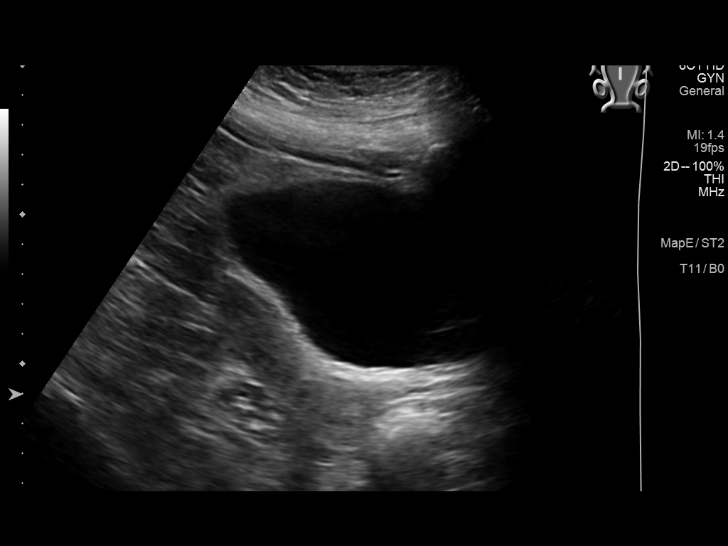
[im 4/44]
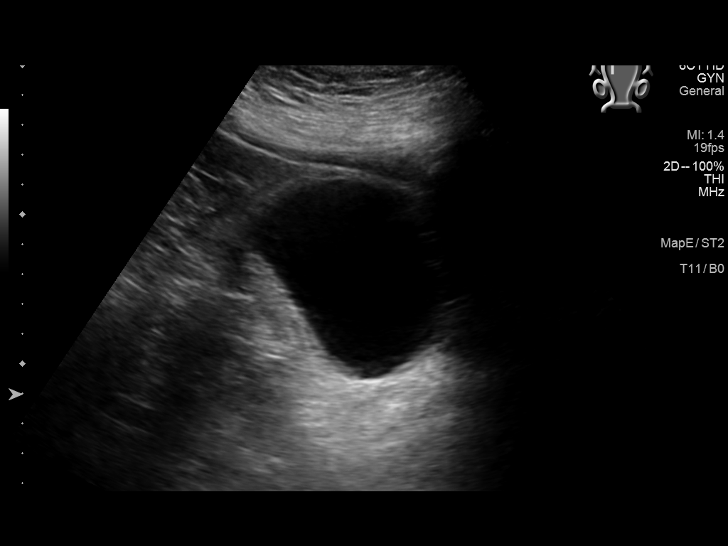
[im 8/44]
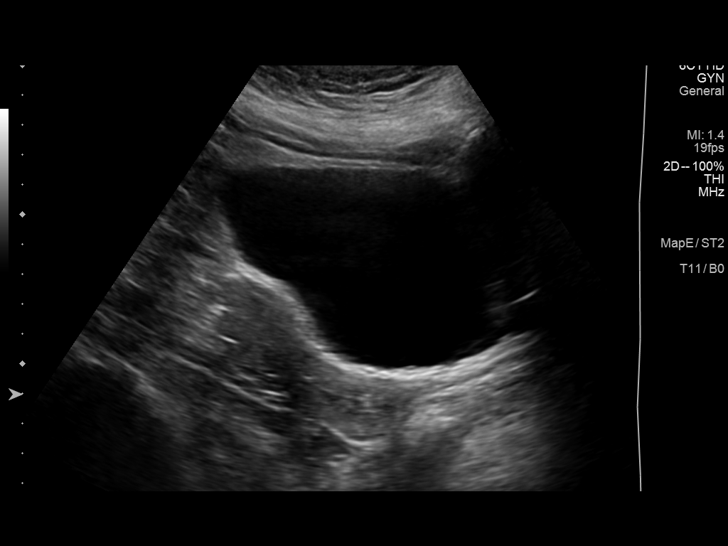
[im 11/44]
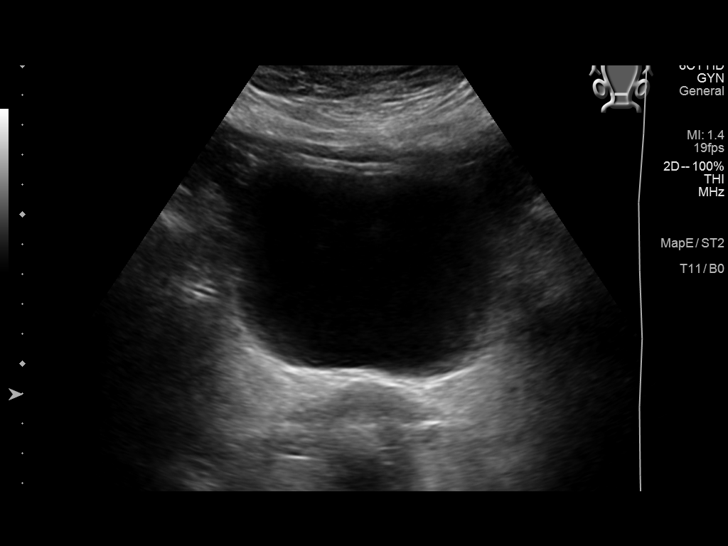
[im 15/44]
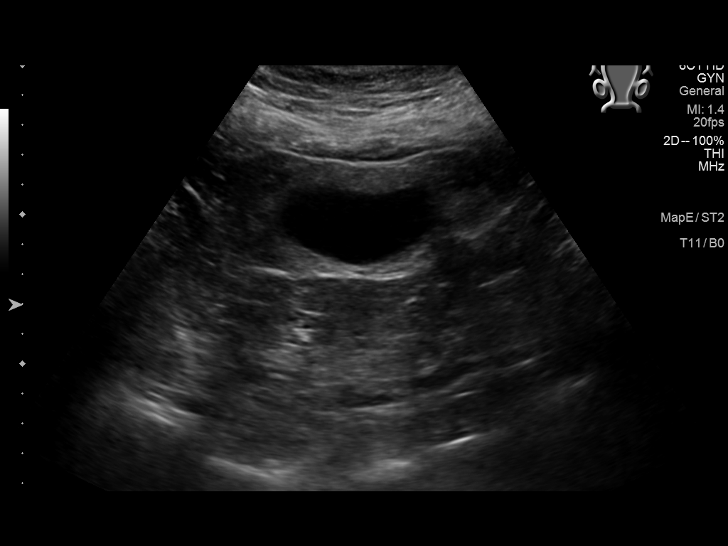
[im 18/44]
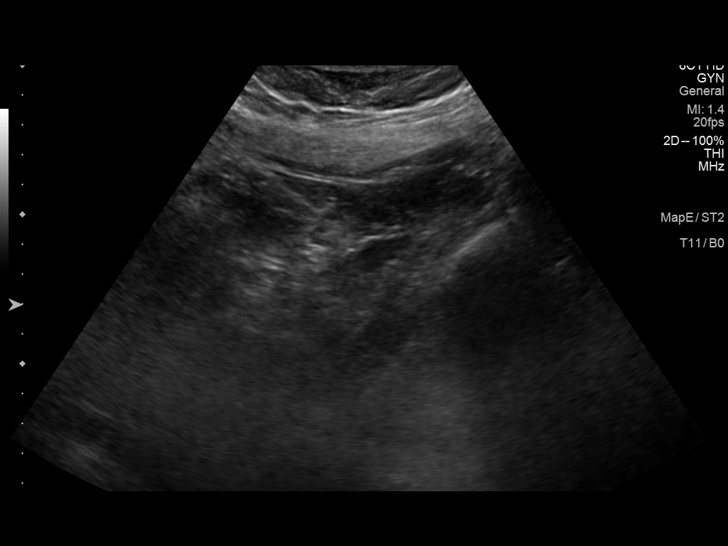
[im 22/44]
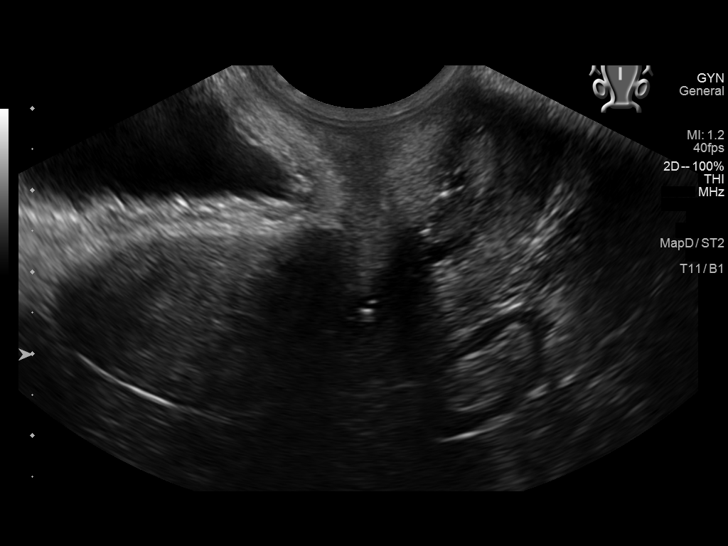
[im 26/44]
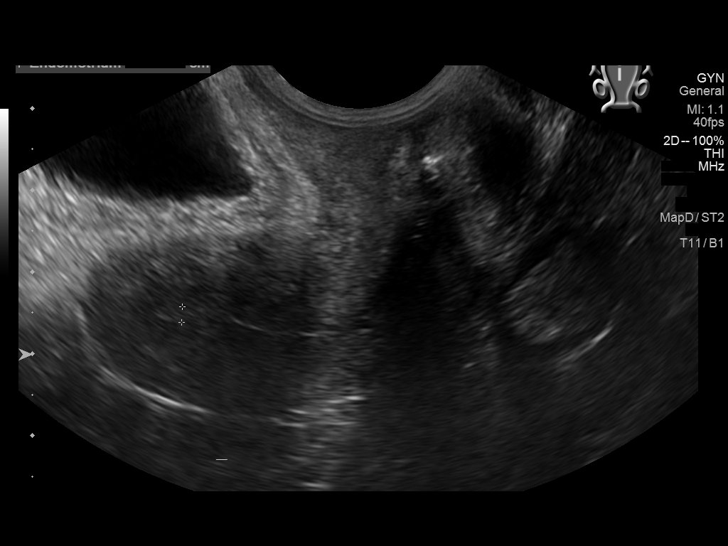
[im 29/44]
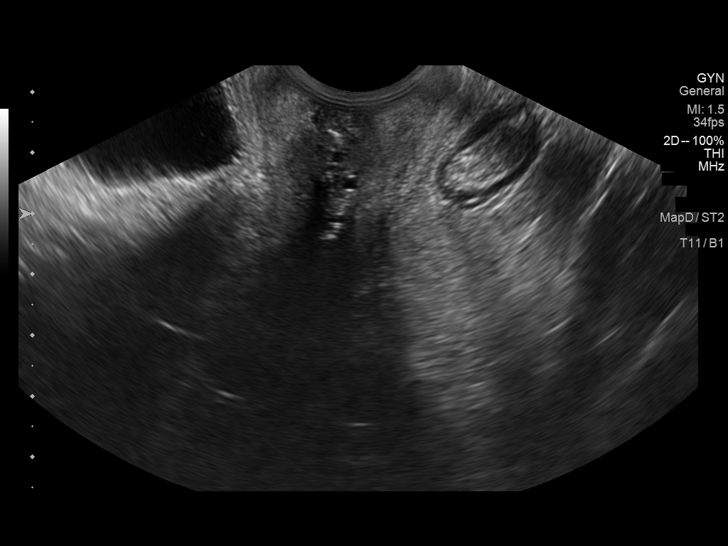
[im 33/44]
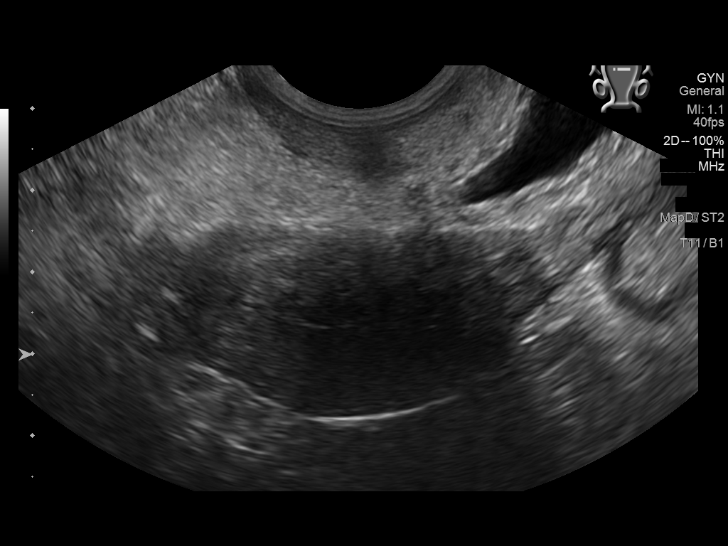
[im 36/44]
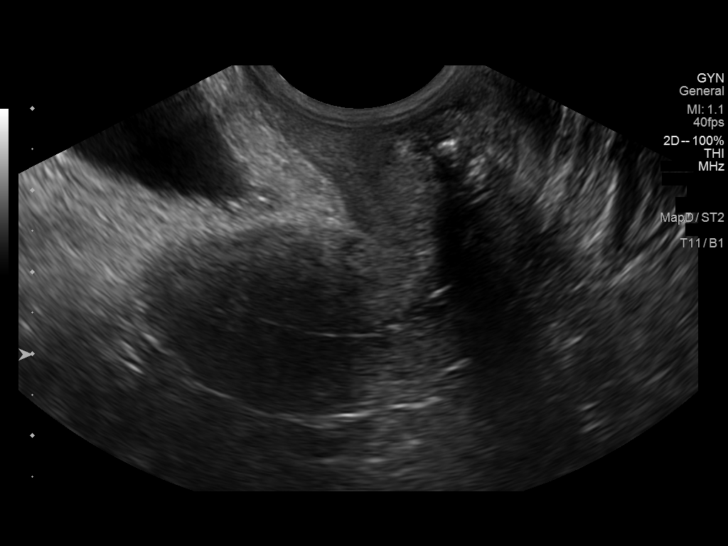
[im 40/44]
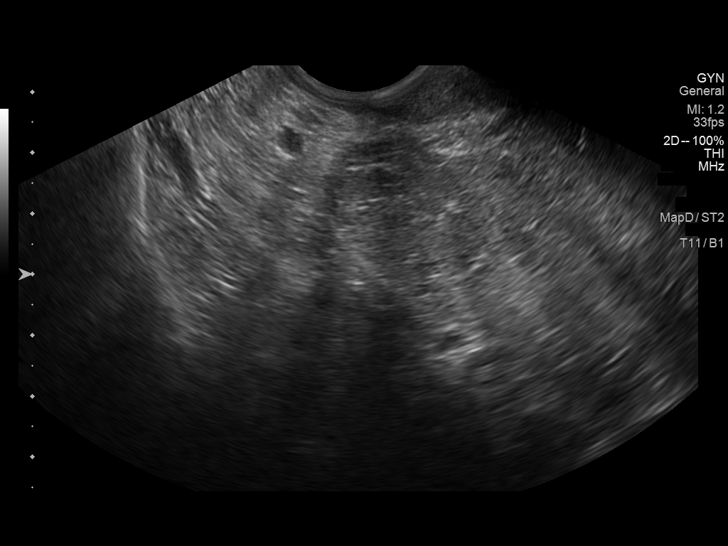
[im 44/44]
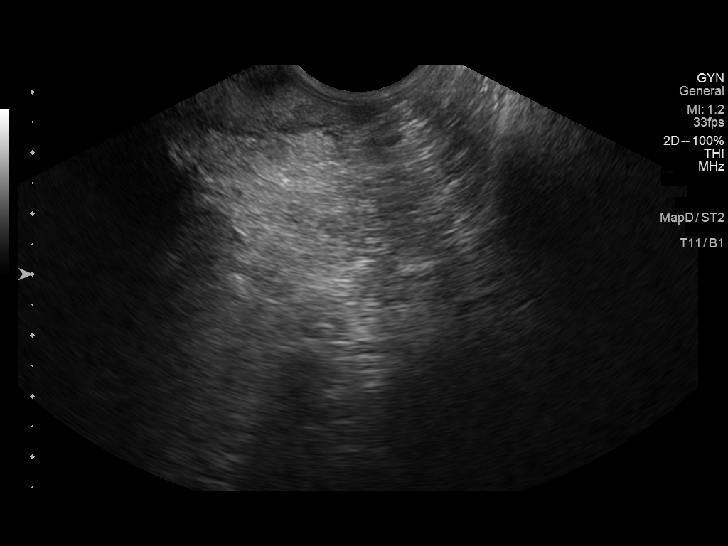

[13 of 25 positions shown; findings below may reference images not displayed]

FINDINGS: Uterus

Measurements: 6.8 x 2.0 x 4.1 cm = volume: 29 mL. No fibroids or
other mass visualized.

Endometrium

Thickness: 2 mm.  No focal abnormality visualized.

Right ovary

Nonvisualized.

Left ovary

Nonvisualized.

Pulsed Doppler evaluation of both ovaries demonstrates normal
low-resistance arterial and venous waveforms.

Other findings

No abnormal free fluid.
IMPRESSION: 1. No ultrasound findings of the pelvis to explain pain.
2. Nonvisualization of the bilateral ovaries.
3. Normal uterus.

## 2022-06-13 ENCOUNTER — Encounter: Payer: Medicare PPO | Admitting: Dermatology

## 2022-08-08 ENCOUNTER — Other Ambulatory Visit: Payer: Self-pay | Admitting: Obstetrics and Gynecology

## 2022-08-08 DIAGNOSIS — N644 Mastodynia: Secondary | ICD-10-CM

## 2022-08-08 DIAGNOSIS — N6323 Unspecified lump in the left breast, lower outer quadrant: Secondary | ICD-10-CM

## 2022-08-14 ENCOUNTER — Ambulatory Visit
Admission: RE | Admit: 2022-08-14 | Discharge: 2022-08-14 | Disposition: A | Discharge status: Home / Self Care | Payer: Medicare PPO | Source: Ambulatory Visit | Attending: Obstetrics and Gynecology | Admitting: Obstetrics and Gynecology

## 2022-08-14 DIAGNOSIS — N644 Mastodynia: Secondary | ICD-10-CM | POA: Diagnosis not present

## 2022-08-14 DIAGNOSIS — N6323 Unspecified lump in the left breast, lower outer quadrant: Secondary | ICD-10-CM

## 2022-08-20 ENCOUNTER — Ambulatory Visit
Admission: EM | Admit: 2022-08-20 | Discharge: 2022-08-20 | Disposition: A | Discharge status: Home / Self Care | Payer: Medicare PPO | Attending: Emergency Medicine | Admitting: Emergency Medicine

## 2022-08-20 DIAGNOSIS — U071 COVID-19: Secondary | ICD-10-CM | POA: Diagnosis not present

## 2022-08-20 MED ORDER — MOLNUPIRAVIR EUA 200MG CAPSULE: 4.0000 | ORAL_CAPSULE | Freq: Two times a day (BID) | ORAL | 0 refills | Status: AC

## 2022-08-20 MED ORDER — BENZONATATE 100 MG PO CAPS: 200.0000 mg | ORAL_CAPSULE | Freq: Three times a day (TID) | ORAL | 0 refills | Status: AC

## 2022-08-20 MED ORDER — IPRATROPIUM BROMIDE 0.06 % NA SOLN: 2.0000 | Freq: Four times a day (QID) | NASAL | 12 refills | Status: AC

## 2022-08-20 MED ORDER — PROMETHAZINE-DM 6.25-15 MG/5ML PO SYRP: 5.0000 mL | ORAL_SOLUTION | Freq: Four times a day (QID) | ORAL | 0 refills | Status: AC | PRN

## 2022-08-20 MED ORDER — MOLNUPIRAVIR EUA 200MG CAPSULE: 4.0000 | ORAL_CAPSULE | Freq: Two times a day (BID) | ORAL | 0 refills | Status: DC

## 2022-08-20 NOTE — Discharge Instructions (Signed)
You have tested positive for COVID-19 today.  You will need to quarantine for 5 days from onset of your symptoms.  After 5 days you can break quarantine if your symptoms have improved and you have not had a fever for 24 hours without taking Tylenol or ibuprofen.  Take molnupiravir twice daily for 5 days for treatment of COVID-19.  Use the Tessalon Perles every 8 hours during the day as needed for cough.  Taken with a small sip of water.  They may give you some numbness to the base of your tongue or metallic taste in her mouth.  This is normal.  Use the Promethazine DM cough syrup at bedtime as needed for cough and congestion.  This will dry up your nasal secretions but will also make you drowsy.  Use the Atrovent nasal spray, 2 squirts up each nostril every 6 hours, as needed for nasal congestion.  If you develop any shortness of breath, especially at rest, you feel like you cannot catch her breath, you are unable to speak in full sentences, or is a late sign your lips begin turning blue you need to call 911 and go to the ER.

## 2022-08-20 NOTE — ED Triage Notes (Signed)
Pt states she had headache yesterday, body aches, pt reports positive home COVID test this morning around 7:30am.

## 2022-08-20 NOTE — ED Provider Notes (Addendum)
[facility]    CSN: [id number] Arrival date & time: 08/20/22  0915      History   Chief Complaint Chief Complaint  Patient presents with   Covid Positive    HPI Janice Rowe is a 66 y.o. female.   HPI  67 year old female here for evaluation after testing COVID-positive.  Patient reports that she developed a headache and some nasal congestion yesterday.  She thought that it was beginning of a sinus infection so she started to use her Flonase which helped with nasal congestion some.  She also had some body aches.  She has a history of RA and she has been off her naproxen lately and she was attributing the body aches to that.  She took a home COVID test this morning and it was positive.  She has also had a nonproductive cough.  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[place]) 10/04/2018   Vitamin D deficiency 10/04/2018   Lung nodule seen on imaging study 08/21/2017   Benign microscopic hematuria 02/06/2017   Cardiac murmur 11/08/2016   Dysphagia 10/11/2016   Encounter for screening colonoscopy 10/11/2016   Rectal bleeding 10/11/2016   Ocular migraine 08/02/2016   Recurrent major depressive disorder, in full remission ([place]) 01/27/2016   Obstructive sleep apnea 09/15/2015    Past Surgical History:  Procedure Laterality Date   CARPAL TUNNEL RELEASE Right 2013   COLONOSCOPY  2007   COLONOSCOPY WITH PROPOFOL N/A 11/29/2016   Procedure: COLONOSCOPY WITH PROPOFOL;  Surgeon: Robert Bellow, MD;  Location: [facility] ENDOSCOPY;   Service: Endoscopy;  Laterality: N/A;   ESOPHAGOGASTRODUODENOSCOPY (EGD) WITH PROPOFOL N/A 11/29/2016   Procedure: ESOPHAGOGASTRODUODENOSCOPY (EGD) WITH PROPOFOL;  Surgeon: Robert Bellow, MD;  Location: [facility] ENDOSCOPY;  Service: Endoscopy;  Laterality: N/A;   TOTAL HIP ARTHROPLASTY Right 02/18/2021   Procedure: TOTAL HIP ARTHROPLASTY ANTERIOR APPROACH;  Surgeon: Hessie Knows, MD;  Location: [facility] ORS;  Service: Orthopedics;  Laterality: Right;    OB History     Gravida  1   Para      Term      Preterm      AB      Living         SAB      IAB      Ectopic      Multiple      Live Births  1        Obstetric Comments  1st Menstrual Cycle:  13 1st Pregnancy:  37          Home Medications    Prior to Admission medications   Medication Sig Start Date End Date Taking? Authorizing Provider  B Complex Vitamins (VITAMIN-B COMPLEX) TABS Take 1 tablet by mouth daily.   Yes [provider]  benzonatate (TESSALON) 100 MG capsule Take 2 capsules (200 mg total) by mouth every 8 (eight) hours. 08/20/22  Yes Margarette Canada, NP  Calcium Carbonate (CALCIUM 600 PO) Take by mouth.  Yes [provider]  cetirizine (ZYRTEC) 10 MG tablet Take 10 mg by mouth daily.   Yes [provider]  Cholecalciferol 4000 units CAPS Take 4,000 Units by mouth daily.   Yes [provider]  docusate sodium (COLACE) 100 MG capsule Take 100 mg by mouth daily.   Yes [provider]  fluticasone (FLONASE) 50 MCG/ACT nasal spray Place 1 spray into both nostrils daily as needed for allergies or rhinitis.   Yes [provider]  folic acid (FOLVITE) 1 MG tablet Take 1 mg by mouth daily.   Yes [provider]  gabapentin (NEURONTIN) 100 MG capsule Take 100 mg by mouth 3 (three) times daily.   Yes [provider]  HUMIRA 40 MG/0.8ML PSKT Inject 40 mg into the skin every 14 (fourteen) days. 03/11/18  Yes [provider]  ipratropium  (ATROVENT) 0.06 % nasal spray Place 2 sprays into both nostrils 4 (four) times daily. 08/20/22  Yes Margarette Canada, NP  melatonin 5 MG TABS Take 5 mg by mouth at bedtime.   Yes [provider]  methotrexate (RHEUMATREX) 2.5 MG tablet Take 12.5 mg by mouth once a week. Caution:Chemotherapy. Protect from light.   Yes [provider]  metoprolol succinate (TOPROL-XL) 50 MG 24 hr tablet Take 50 mg by mouth daily. 09/23/16  Yes [provider]  naproxen (NAPROSYN) 500 MG tablet Take 500 mg by mouth 2 (two) times daily as needed (pain).   Yes [provider]  Omega-3 Fatty Acids (FISH OIL) 1200 MG CPDR Take 1,200 mg by mouth daily.   Yes [provider]  omeprazole (PRILOSEC) 20 MG capsule Take 20 mg by mouth daily.   Yes [provider]  predniSONE (DELTASONE) 5 MG tablet Take 5 mg by mouth every other day. Pt currently using 2.5 mg po QOD.   Yes [provider]  promethazine-dextromethorphan (PROMETHAZINE-DM) 6.25-15 MG/5ML syrup Take 5 mLs by mouth 4 (four) times daily as needed. 08/20/22  Yes Margarette Canada, NP  Turmeric 500 MG CAPS Take 500 mg by mouth daily.   Yes [provider]  venlafaxine XR (EFFEXOR-XR) 75 MG 24 hr capsule Take 75 mg by mouth daily with breakfast.   Yes [provider]  zolpidem (AMBIEN) 5 MG tablet Take 5 mg by mouth at bedtime.   Yes [provider]  clobetasol ointment (TEMOVATE) 2.95 % Apply 1 application topically as directed. qhs to aa prn flares, avoid face, axilla Patient taking differently: Apply 1 application  topically daily as needed (flare ups). qhs to aa prn flares, avoid face, axilla 05/24/20   Brendolyn Patty, MD  enoxaparin (LOVENOX) 40 MG/0.4ML injection Inject 0.4 mLs (40 mg total) into the skin daily for 14 days. 02/19/21 03/05/21  Reche Dixon, PA-C  molnupiravir EUA (LAGEVRIO) 200 mg CAPS capsule Take 4 capsules (800 mg total) by mouth 2 (two) times daily for 5 days. 08/20/22 08/25/22   Margarette Canada, NP  traMADol (ULTRAM) 50 MG tablet Take 50 mg by mouth every 12 (twelve) hours as needed for severe pain. Patient not taking: Reported on 05/31/2022    [provider]  traMADol (ULTRAM) 50 MG tablet Take 1-2 tablets (50-100 mg total) by mouth every 6 (six) hours. Patient not taking: Reported on 05/31/2022 02/19/21   Reche Dixon, PA-C  vitamin B-12 (CYANOCOBALAMIN) 100 MCG tablet Take 100 mcg by mouth daily.    [provider]    Family History Family History  Problem Relation Age of Onset  Breast cancer Mother 68    Social History Social History   Tobacco Use   Smoking status: Former    Packs/day: 0.50    Years: 10.00    Total pack years: 5.00    Types: Cigarettes    Quit date: 1985    Years since quitting: 38.7   Smokeless tobacco: Never  Vaping Use   Vaping Use: Never used  Substance Use Topics   Alcohol use: Not Currently   Drug use: No     Allergies   Miralax [polyethylene glycol] and Ceftriaxone   Review of Systems Review of Systems  Constitutional:  Negative for fever.  HENT:  Positive for congestion. Negative for ear pain, rhinorrhea and sore throat.   Respiratory:  Positive for cough. Negative for shortness of breath and wheezing.   Gastrointestinal:  Negative for diarrhea, nausea and vomiting.  Musculoskeletal:  Positive for arthralgias and myalgias.  Skin:  Negative for rash.  Neurological:  Positive for headaches.  Hematological: Negative.   Psychiatric/Behavioral: Negative.       Physical Exam Triage Vital Signs ED Triage Vitals  Enc Vitals Group     BP 08/20/22 0941 (!) 147/76     Pulse Rate 08/20/22 0941 76     Resp --      Temp 08/20/22 0941 98.6 F (37 C)     Temp Source 08/20/22 0941 Oral     SpO2 08/20/22 0941 97 %     Weight 08/20/22 0937 222 lb (100.7 kg)     Height 08/20/22 0937 '5\' 11"'$  (1.803 m)     Head Circumference --      Peak Flow --      Pain Score 08/20/22 0937 4     Pain Loc --      Pain  Edu? --      Excl. in [place]? --    No data found.  Updated Vital Signs BP (!) 147/76 (BP Location: Left Arm)   Pulse 76   Temp 98.6 F (37 C) (Oral)   Ht '5\' 11"'$  (1.803 m)   Wt 222 lb (100.7 kg)   SpO2 97%   BMI 30.96 kg/m   Visual Acuity Right Eye Distance:   Left Eye Distance:   Bilateral Distance:    Right Eye Near:   Left Eye Near:    Bilateral Near:     Physical Exam Vitals and nursing note reviewed.  Constitutional:      Appearance: Normal appearance. She is not ill-appearing.  HENT:     Head: Normocephalic and atraumatic.     Right Ear: Tympanic membrane, ear canal and external ear normal. There is no impacted cerumen.     Left Ear: Tympanic membrane, ear canal and external ear normal. There is no impacted cerumen.     Nose: Congestion present. No rhinorrhea.     Mouth/Throat:     Mouth: Mucous membranes are moist.     Pharynx: Oropharynx is clear. No oropharyngeal exudate or posterior oropharyngeal erythema.  Cardiovascular:     Rate and Rhythm: Normal rate and regular rhythm.     Pulses: Normal pulses.     Heart sounds: Normal heart sounds. No murmur heard.    No friction rub. No gallop.  Pulmonary:     Effort: Pulmonary effort is normal.     Breath sounds: Normal breath sounds. No wheezing, rhonchi or rales.  Musculoskeletal:     Cervical back: Normal range of motion and neck supple.  Lymphadenopathy:  Cervical: No cervical adenopathy.  Skin:    General: Skin is warm and dry.     Capillary Refill: Capillary refill takes less than 2 seconds.     Findings: No erythema or rash.  Neurological:     General: No focal deficit present.     Mental Status: She is alert and oriented to person, place, and time.  Psychiatric:        Mood and Affect: Mood normal.        Behavior: Behavior normal.        Thought Content: Thought content normal.        Judgment: Judgment normal.      UC Treatments / Results  Labs (all labs ordered are listed, but only  abnormal results are displayed) Labs Reviewed - No data to display  EKG   Radiology No results found.  Procedures Procedures (including critical care time)  Medications Ordered in UC Medications - No data to display  Initial Impression / Assessment and Plan / UC Course  I have reviewed the triage vital signs and the nursing notes.  Pertinent labs & imaging results that were available during my care of the patient were reviewed by me and considered in my medical decision making (see chart for details).   Patient is nontoxic-appearing 66 year old female here for evaluation after testing COVID-positive this morning.  Her symptoms started yesterday and they consist of headache, body aches, and nonproductive cough.  She has also had some congestion which she has been treating with Flonase with some relief.  On exam patient does have erythematous edematous nasal mucosa with scant clear discharge in both nares.  Oropharyngeal exam is benign.  No cervical lymphadenopathy on exam.  Cardiopulmonary exam reveals clear lung sounds in all fields.  I will treat the patient with molnupiravir as she is on Effexor and this is a contraindication for Paxlovid.  I will also prescribe adjuvant nasal spray, Tessalon Perles, and Promethazine DM cough syrup.  We discussed monitoring for worsening of respiratory symptoms and if they develop which injury or visit to the emergency department.  Patient verbalized understanding of same.   Final Clinical Impressions(s) / UC Diagnoses   Final diagnoses:  [id number]     Discharge Instructions      You have tested positive for COVID-19 today.  You will need to quarantine for 5 days from onset of your symptoms.  After 5 days you can break quarantine if your symptoms have improved and you have not had a fever for 24 hours without taking Tylenol or ibuprofen.  Take molnupiravir twice daily for 5 days for treatment of COVID-19.  Use the Tessalon Perles every 8 hours  during the day as needed for cough.  Taken with a small sip of water.  They may give you some numbness to the base of your tongue or metallic taste in her mouth.  This is normal.  Use the Promethazine DM cough syrup at bedtime as needed for cough and congestion.  This will dry up your nasal secretions but will also make you drowsy.  Use the Atrovent nasal spray, 2 squirts up each nostril every 6 hours, as needed for nasal congestion.  If you develop any shortness of breath, especially at rest, you feel like you cannot catch her breath, you are unable to speak in full sentences, or is a late sign your lips begin turning blue you need to call 911 and go to the ER.     ED Prescriptions  Medication Sig Dispense Auth. Provider   benzonatate (TESSALON) 100 MG capsule Take 2 capsules (200 mg total) by mouth every 8 (eight) hours. 21 capsule Margarette Canada, NP   ipratropium (ATROVENT) 0.06 % nasal spray Place 2 sprays into both nostrils 4 (four) times daily. 15 mL Margarette Canada, NP   molnupiravir EUA (LAGEVRIO) 200 mg CAPS capsule  (Status: Discontinued) Take 4 capsules (800 mg total) by mouth 2 (two) times daily for 5 days. 40 capsule Margarette Canada, NP   promethazine-dextromethorphan (PROMETHAZINE-DM) 6.25-15 MG/5ML syrup Take 5 mLs by mouth 4 (four) times daily as needed. 118 mL Margarette Canada, NP   molnupiravir EUA (LAGEVRIO) 200 mg CAPS capsule Take 4 capsules (800 mg total) by mouth 2 (two) times daily for 5 days. 40 capsule Margarette Canada, NP      PDMP not reviewed this encounter.   Margarette Canada, NP 08/20/22 1009    Margarette Canada, NP 08/20/22 1010

## 2023-01-01 ENCOUNTER — Other Ambulatory Visit: Payer: Self-pay | Admitting: Obstetrics and Gynecology

## 2023-01-01 DIAGNOSIS — N6489 Other specified disorders of breast: Secondary | ICD-10-CM

## 2023-02-14 ENCOUNTER — Ambulatory Visit
Admission: RE | Admit: 2023-02-14 | Discharge: 2023-02-14 | Disposition: A | Discharge status: Home / Self Care | Payer: Medicare PPO | Source: Ambulatory Visit | Attending: Obstetrics and Gynecology | Admitting: Obstetrics and Gynecology

## 2023-02-14 DIAGNOSIS — N6489 Other specified disorders of breast: Secondary | ICD-10-CM | POA: Insufficient documentation

## 2023-03-24 ENCOUNTER — Other Ambulatory Visit: Payer: Self-pay

## 2023-03-24 ENCOUNTER — Emergency Department
Admission: EM | Admit: 2023-03-24 | Discharge: 2023-03-24 | Disposition: A | Discharge status: Home / Self Care | Payer: Medicare PPO | Attending: Emergency Medicine | Admitting: Emergency Medicine

## 2023-03-24 ENCOUNTER — Emergency Department: Payer: Medicare PPO

## 2023-03-24 ENCOUNTER — Encounter: Payer: Self-pay | Admitting: Intensive Care

## 2023-03-24 DIAGNOSIS — W11XXXA Fall on and from ladder, initial encounter: Secondary | ICD-10-CM | POA: Insufficient documentation

## 2023-03-24 DIAGNOSIS — S300XXA Contusion of lower back and pelvis, initial encounter: Secondary | ICD-10-CM | POA: Diagnosis not present

## 2023-03-24 DIAGNOSIS — S161XXA Strain of muscle, fascia and tendon at neck level, initial encounter: Secondary | ICD-10-CM | POA: Insufficient documentation

## 2023-03-24 DIAGNOSIS — W19XXXA Unspecified fall, initial encounter: Secondary | ICD-10-CM

## 2023-03-24 DIAGNOSIS — Y92009 Unspecified place in unspecified non-institutional (private) residence as the place of occurrence of the external cause: Secondary | ICD-10-CM | POA: Insufficient documentation

## 2023-03-24 DIAGNOSIS — M549 Dorsalgia, unspecified: Secondary | ICD-10-CM | POA: Diagnosis present

## 2023-03-24 MED ORDER — HYDROCODONE-ACETAMINOPHEN 5-325 MG PO TABS: 1.0000 | ORAL_TABLET | Freq: Four times a day (QID) | ORAL | 0 refills | Status: AC | PRN

## 2023-03-24 NOTE — ED Triage Notes (Signed)
Patient reports falling on back off a ladder. Estimated fall was about three feet. Denies hitting head or LOC.

## 2023-03-24 NOTE — ED Notes (Signed)
Pt verbalizes understanding of discharge instructions. Opportunity for questioning and answers were provided. Pt discharged from ED to home with friend.    

## 2023-03-24 NOTE — Discharge Instructions (Addendum)
Follow-up with your primary care provider if any continued problems or concerns.  You may use ice or heat to your back as needed for discomfort.  A prescription for pain medication was sent to the pharmacy to take if needed.  Make sure that you take this with food and also be aware that this medication could cause drowsiness and increase your risk for falling.

## 2023-03-24 NOTE — ED Provider Notes (Signed)
[facility] Provider Note    Event Date/Time   First MD Initiated Contact with Patient 03/24/23 1133     (approximate)   History   Fall   HPI  Janice Rowe is a 67 y.o. female presents to the ED with complaint of back pain.  Patient states that she was on a ladder getting a tree limb when she fell onto the ground landing on her back.  She denies any head injury or loss of consciousness.  No headache or visual changes.  Patient has been ambulatory since the accident.  No history of rheumatoid arthritis, hypertension, vitamin D deficiency.  Patient is currently not taking prednisone for her RA.     Physical Exam   Triage Vital Signs: ED Triage Vitals  Enc Vitals Group     BP 03/24/23 1120 135/76     Pulse Rate 03/24/23 1120 66     Resp 03/24/23 1120 18     Temp 03/24/23 1120 98.1 F (36.7 C)     Temp Source 03/24/23 1120 Oral     SpO2 03/24/23 1120 97 %     Weight 03/24/23 1116 210 lb (95.3 kg)     Height 03/24/23 1116 5\' 11"  (1.803 m)     Head Circumference --      Peak Flow --      Pain Score 03/24/23 1116 8     Pain Loc --      Pain Edu? --      Excl. in [place]? --     Most recent vital signs: Vitals:   03/24/23 1120  BP: 135/76  Pulse: 66  Resp: 18  Temp: 98.1 F (36.7 C)  SpO2: 97%     General: Awake, no distress.  Alert, talkative, oriented x 3. CV:  Good peripheral perfusion.  Heart regular rate and rhythm. Resp:  Normal effort.  Lungs clear. Abd:  No distention.  Other:  No deformity noted on examination of cervical spine or lumbar spine and no skin discoloration or abrasions are noted.  Minimal diffuse tenderness and patient is able to stand and ambulate.  Good muscle strength bilaterally.   ED Results / Procedures / Treatments   Labs (all labs ordered are listed, but only abnormal results are displayed) Labs Reviewed - No data to display   RADIOLOGY Lumbar spine x-ray images were reviewed by myself independent of the  radiologist with degenerative changes noted.  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  CT cervical spine per radiology is negative for acute fracture or traumatic subluxation.  Multilevel cervical spondylosis noted in the report.  PROCEDURES:  Critical Care performed:   Procedures   MEDICATIONS ORDERED IN ED: Medications - No data to display   IMPRESSION / MDM / ASSESSMENT AND PLAN / ED COURSE  I reviewed the triage vital signs and the nursing notes.   Differential diagnosis includes, but is not limited to, cervical fracture, cervical strain, contusion, compression fracture, lumbar strain  67 year old female presents to the ED after a fall that occurred earlier this morning when she was on a ladder falling approximately 3 feet onto the ground.  Patient landed on her back and denies any head injury.  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  Patient was made aware and states that she will take over-the-counter Tylenol if needed  for pain.  I discussed that with her rheumatoid arthritis she may need something stronger.  A prescription for hydrocodone was sent to the pharmacy to take if needed however patient states that she may never pick the prescription up as she does not like taking any controlled substances.  We did discuss ice and heat and follow-up with her PCP if any continued problems or concerns.      Patient's presentation is most consistent with acute complicated illness / injury requiring diagnostic workup.  FINAL CLINICAL IMPRESSION(S) / ED DIAGNOSES   Final diagnoses:  Lumbar contusion, initial encounter  Cervical strain, acute, initial encounter  Fall in home, initial encounter     Rx / DC Orders   ED Discharge Orders          Ordered    HYDROcodone-acetaminophen (NORCO/VICODIN)  5-325 MG tablet  Every 6 hours PRN        03/24/23 1253             Note:  This document was prepared using Dragon voice recognition software and may include unintentional dictation errors.   Tommi Rumps, PA-C 03/24/23 1421    Minna Antis, MD 03/24/23 1924

## 2023-05-17 ENCOUNTER — Ambulatory Visit
Admission: RE | Admit: 2023-05-17 | Discharge: 2023-05-17 | Disposition: A | Discharge status: Home / Self Care | Payer: Medicare PPO | Source: Ambulatory Visit | Attending: Physician Assistant | Admitting: Physician Assistant

## 2023-05-17 ENCOUNTER — Other Ambulatory Visit: Payer: Self-pay | Admitting: Physician Assistant

## 2023-05-17 DIAGNOSIS — M545 Low back pain, unspecified: Secondary | ICD-10-CM | POA: Insufficient documentation

## 2023-05-18 ENCOUNTER — Encounter: Payer: Self-pay | Admitting: Physician Assistant

## 2023-05-18 DIAGNOSIS — R202 Paresthesia of skin: Secondary | ICD-10-CM

## 2023-05-18 DIAGNOSIS — M545 Low back pain, unspecified: Secondary | ICD-10-CM

## 2023-05-24 ENCOUNTER — Other Ambulatory Visit: Payer: Self-pay | Admitting: Physician Assistant

## 2023-05-24 DIAGNOSIS — S32010A Wedge compression fracture of first lumbar vertebra, initial encounter for closed fracture: Secondary | ICD-10-CM

## 2023-05-28 ENCOUNTER — Ambulatory Visit: Payer: Medicare PPO | Attending: Physician Assistant

## 2023-05-28 ENCOUNTER — Encounter: Payer: Self-pay | Admitting: Physical Therapy

## 2023-05-28 DIAGNOSIS — M6281 Muscle weakness (generalized): Secondary | ICD-10-CM | POA: Insufficient documentation

## 2023-05-28 DIAGNOSIS — S32010A Wedge compression fracture of first lumbar vertebra, initial encounter for closed fracture: Secondary | ICD-10-CM | POA: Insufficient documentation

## 2023-05-28 DIAGNOSIS — M5459 Other low back pain: Secondary | ICD-10-CM | POA: Diagnosis present

## 2023-05-28 NOTE — Therapy (Signed)
OUTPATIENT PHYSICAL THERAPY THORACOLUMBAR EVALUATION   Patient Name: Janice Rowe MRN: [id number] DOB:08-17-1956, 67 y.o., female Today's Date: 05/28/2023  END OF SESSION:  PT End of Session - 05/28/23 1257     Visit Number 1    Number of Visits 16    Date for PT Re-Evaluation 07/23/23    Progress Note Due on Visit 10    PT Start Time 1300    PT Stop Time 1344    PT Time Calculation (min) 44 min    Activity Tolerance Patient tolerated treatment well    Behavior During Therapy [facility] for tasks assessed/performed             Past Medical History:  Diagnosis Date   Acute pancreatitis 11/2008   Anemia    Arthritis    GERD (gastroesophageal reflux disease)    Hiatal hernia    Hypertension    Rheumatoid arthritis (HCC)    Sleep apnea    uses c-pap   Vitamin D deficiency    Past Surgical History:  Procedure Laterality Date   CARPAL TUNNEL RELEASE Right 2013   COLONOSCOPY  2007   COLONOSCOPY WITH PROPOFOL N/A 11/29/2016   Procedure: COLONOSCOPY WITH PROPOFOL;  Surgeon: Earline Mayotte, MD;  Location: [facility] ENDOSCOPY;  Service: Endoscopy;  Laterality: N/A;   ESOPHAGOGASTRODUODENOSCOPY (EGD) WITH PROPOFOL N/A 11/29/2016   Procedure: ESOPHAGOGASTRODUODENOSCOPY (EGD) WITH PROPOFOL;  Surgeon: Earline Mayotte, MD;  Location: [facility] ENDOSCOPY;  Service: Endoscopy;  Laterality: N/A;   TOTAL HIP ARTHROPLASTY Right 02/18/2021   Procedure: TOTAL HIP ARTHROPLASTY ANTERIOR APPROACH;  Surgeon: Kennedy Bucker, MD;  Location: [facility] ORS;  Service: Orthopedics;  Laterality: Right;   Patient Active Problem List   Diagnosis Date Noted   S/P hip replacement 02/18/2021   S/P total hip arthroplasty 02/18/2021   Anemia 10/04/2018   Essential hypertension 10/04/2018   Rheumatoid arthritis with rheumatoid factor (HCC) 10/04/2018   Vitamin D deficiency 10/04/2018   Lung nodule seen on imaging study 08/21/2017   Benign microscopic hematuria 02/06/2017   Cardiac murmur 11/08/2016   Dysphagia  10/11/2016   Encounter for screening colonoscopy 10/11/2016   Rectal bleeding 10/11/2016   Ocular migraine 08/02/2016   Recurrent major depressive disorder, in full remission (HCC) 01/27/2016   Obstructive sleep apnea 09/15/2015    PCP: Lynnea Ferrier, MD   REFERRING PROVIDER: Ignacia Bayley, PA-C  REFERRING DIAG:  M54.50 (ICD-10-CM) - Low back pain, unspecified      Rationale for Evaluation and Treatment: Rehabilitation  THERAPY DIAG:  Other low back pain  Compression fracture of L1 vertebra, initial encounter (HCC)  Muscle weakness (generalized)  ONSET DATE: 03/24/23  SUBJECTIVE:  SUBJECTIVE STATEMENT: Patient reports using heat, ice, and rest initially after injury. Difficulty to stand >5 minutes, prolonged walking, gardening/watering flowers, and household chores. Has an appointment with an interventional radiologist to determine if she is a candidate for kyphoplasty. In standing, patient feels pulling on the R side of her back.  Has not quit completing day to day tasks since injury but has modified tasks (gardening, weed eating, painting, etc.)  PERTINENT HISTORY:  Patient was seen in MD office for continued low back pain since her fall from a ladder back in April. X-ray performed at that time showing severe arthritis but no fracture. Continues to have pain especially with walking. Relieved with sitting. Taking gabapentin. She does have some leg paresthesias, tingling.  CT on 05/17/23 reveals L1 compression and PA suggested potential kyphoplasty with patient wanting to do research prior to decision.   PAIN:  Are you having pain? Yes: NPRS scale: 7/10 Pain location: low back Pain description: pulling, pressure Aggravating factors: prolonged standing, walking,  Relieving factors: sitting,  rest   PRECAUTIONS: Back  WEIGHT BEARING RESTRICTIONS: No  FALLS:  Has patient fallen in last 6 months? Yes. Number of falls 1  LIVING ENVIRONMENT: Lives with: lives alone Lives in: House/apartment Stairs: No Has following equipment at home: Single point cane, Environmental consultant - 2 wheeled, and Ramped entry  OCCUPATION: retired   PLOF: Independent  PATIENT GOALS: pain reduction in the back   NEXT MD VISIT: 05/29/23 - with IR   OBJECTIVE:   DIAGNOSTIC FINDINGS:  CT on 05/17/23: Acute appearing compression fracture of the L1 vertebral body with up to approximately 40% loss of vertebral body height anteriorly and minimal bony retropulsion but no spinal canal stenosis.    PATIENT SURVEYS:  FOTO 46 with goal of 59  SCREENING FOR RED FLAGS: Bowel or bladder incontinence: No Spinal tumors: No Cauda equina syndrome: No Compression fracture: Yes: L1 compression Abdominal aneurysm: No  COGNITION: Overall cognitive status: Within functional limits for tasks assessed     SENSATION: WFL   MUSCLE LENGTH: Hamstrings: WFL    POSTURE: rounded shoulders and forward head  PALPATION: Paraspinal tightness T8-L5  LUMBAR ROM:   AROM eval  Flexion To mid shin  Extension 50%   Right lateral flexion To mid thigh  Left lateral flexion To mid thigh   Right rotation WFL  Left rotation WFL    (Blank rows = not tested)   LOWER EXTREMITY MMT:    MMT Right eval Left eval  Hip flexion 4 4  Hip extension 4 4  Hip abduction 4 4  Hip adduction 4+ 4+  Hip internal rotation    Hip external rotation    Knee flexion 4+ 4+  Knee extension 4+ 4+  Ankle dorsiflexion 4+ 4+  Ankle plantarflexion    Ankle inversion    Ankle eversion     (Blank rows = not tested)  LUMBAR SPECIAL TESTS:  Straight leg raise test: Negative and FABER test: Negative  FUNCTIONAL TESTS:  6 minute walk test: NT  GAIT: Distance walked: 25' Assistive device utilized: None Level of assistance: Complete  Independence Comments: guarded gait, decreased stance time bilaterally, stiff trunk   TODAY'S TREATMENT:  DATE: 05/28/23   HEP handout provided, see below   PATIENT EDUCATION:  Education details: HEP, POC, goals  Person educated: Patient Education method: Explanation, Demonstration, and Handouts Education comprehension: verbalized understanding and returned demonstration  HOME EXERCISE PROGRAM: Access Code: [email or web address] URL: [email or web address] Date: 05/28/2023 Prepared by: Maylon Peppers  Exercises - Supine Posterior Pelvic Tilt  - 2-3 x daily - 5-7 x weekly - 3 sets - 10 reps - Supine Bridge  - 2-3 x daily - 5-7 x weekly - 3 sets - 10 reps - Bent Knee Fallouts  - 2-3 x daily - 5-7 x weekly - 3 sets - 10 reps  ASSESSMENT:  CLINICAL IMPRESSION: Patient is a 67 y.o. female who was seen today for physical therapy evaluation and treatment for back pain 2/2 L1 compression fx which was shown on lumbar CT on 05/17/23. Patient has difficulty with prolonged standing, yard work, bending, lifting, and household duties. Demonstrates hip flexor, abductor, and extensor weakness with MMT.  She has a hx of RA which maintains her back pain around 3/10 with current worst being 7/10 with standing activities. Currently patient is deciding whether to proceed with kyphoplasty + PT vs PT alone. Patient will benefit from skilled PT services to address listed impairments to improve mobility with reduction of pain to return to PLOF.   OBJECTIVE IMPAIRMENTS: Abnormal gait, decreased activity tolerance, decreased balance, decreased endurance, decreased mobility, difficulty walking, decreased strength, increased muscle spasms, impaired flexibility, postural dysfunction, and pain.   ACTIVITY LIMITATIONS: lifting, bending, standing, squatting, and stairs  PARTICIPATION  LIMITATIONS: cleaning, laundry, community activity, yard work, and church  PERSONAL FACTORS: Age, Behavior pattern, Past/current experiences, and Time since onset of injury/illness/exacerbation are also affecting patient's functional outcome.   REHAB POTENTIAL: Good  CLINICAL DECISION MAKING: Stable/uncomplicated  EVALUATION COMPLEXITY: Moderate   GOALS: Goals reviewed with patient? Yes  SHORT TERM GOALS: Target date: 06/25/2023  Patient will be independent in HEP to improve strength/mobility for better functional independence with ADLs and yard work Baseline: 7/1: see above Goal status: INITIAL   LONG TERM GOALS: Target date: 07/23/2023   Patient will increase FOTO score to equal to or greater than 63 to demonstrate statistically significant improvement in mobility and quality of life.  Baseline: 7/1: 46 Goal status: INITIAL  2.  Patient will increase BLE gross strength to greater than or equal to 4+/5 as to improve functional strength for independent gait, increased standing tolerance and increased ADL ability. Baseline: 7/1: see chart above Goal status: INITIAL  3.  Patient will be able to tolerate >15 minutes of standing while participating in household chores and yard work.  Baseline: 7/1: ~5 minutes before requiring seated rest break  Goal status: INITIAL  PLAN:  PT FREQUENCY: 1-2x/week  PT DURATION: 8 weeks  PLANNED INTERVENTIONS: Therapeutic exercises, Therapeutic activity, Neuromuscular re-education, Balance training, Gait training, Patient/Family education, Self Care, Joint mobilization, Aquatic Therapy, Dry Needling, Spinal mobilization, Cryotherapy, Moist heat, and Manual therapy.  PLAN FOR NEXT SESSION: HEP review, STM paraspinals, core strengthening    Maylon Peppers, PT, DPT Physical Therapist - [place]  [facility]  05/28/2023, 3:20 PM

## 2023-05-29 ENCOUNTER — Ambulatory Visit
Admission: RE | Admit: 2023-05-29 | Discharge: 2023-05-29 | Disposition: A | Discharge status: Home / Self Care | Payer: Medicare PPO | Source: Ambulatory Visit | Attending: Physician Assistant | Admitting: Physician Assistant

## 2023-05-29 DIAGNOSIS — S32010A Wedge compression fracture of first lumbar vertebra, initial encounter for closed fracture: Secondary | ICD-10-CM

## 2023-05-29 HISTORY — PX: IR RADIOLOGIST EVAL & MGMT: IMG5224

## 2023-05-29 NOTE — Consult Note (Signed)
Chief Complaint: Patient was seen in consultation today for L1 compression fracture at the request of Tumey,Robert  Referring Physician(s): Tumey,Robert  History of Present Illness: Janice Rowe is a 67 y.o. female who was in her usual state of health until this past April when she was standing on a ladder attempting to get a large limb off the roof of her home.  Unfortunately, she had a fall backward off the ladder and suffered acute onset of severe back pain.  Initial radiographs demonstrated no evidence of fracture.  However, due to persistent pain, a CT scan was obtained on 05/17/2023 confirming a subacute compression fracture of the L1 vertebral body.  Her pain was severe initially but has since significantly improved.  Now, she reports essentially no pain while sitting, lying or resting.  Her pain does get up to a 6 or 7 out of 10 if she is standing for a prolonged period of time watering her flowers/garden, or when she is walking for long periods of time.  Her symptoms are not particularly disabling.  She scored only 6 out of a possible 24 on the [employer] disability questionnaire.  Overall, she feels that she is significantly improved.  She is not taking any narcotic pain medication.  She does have a history of rheumatoid arthritis and takes Aleve daily at baseline.  Additionally, she occasionally takes Tylenol in the evening.  She recently began physical therapy and had her first session earlier this week.  She had some mild muscle soreness after but was able to complete the exercises and stretching activities without any significant pain or exacerbation in her underlying symptoms.  Past Medical History:  Diagnosis Date   Acute pancreatitis 11/2008   Anemia    Arthritis    GERD (gastroesophageal reflux disease)    Hiatal hernia    Hypertension    Rheumatoid arthritis (HCC)    Sleep apnea    uses c-pap   Vitamin D deficiency     Past Surgical History:  Procedure  Laterality Date   CARPAL TUNNEL RELEASE Right 2013   COLONOSCOPY  2007   COLONOSCOPY WITH PROPOFOL N/A 11/29/2016   Procedure: COLONOSCOPY WITH PROPOFOL;  Surgeon: Earline Mayotte, MD;  Location: [facility] ENDOSCOPY;  Service: Endoscopy;  Laterality: N/A;   ESOPHAGOGASTRODUODENOSCOPY (EGD) WITH PROPOFOL N/A 11/29/2016   Procedure: ESOPHAGOGASTRODUODENOSCOPY (EGD) WITH PROPOFOL;  Surgeon: Earline Mayotte, MD;  Location: [facility] ENDOSCOPY;  Service: Endoscopy;  Laterality: N/A;   IR RADIOLOGIST EVAL & MGMT  05/29/2023   TOTAL HIP ARTHROPLASTY Right 02/18/2021   Procedure: TOTAL HIP ARTHROPLASTY ANTERIOR APPROACH;  Surgeon: Kennedy Bucker, MD;  Location: [facility] ORS;  Service: Orthopedics;  Laterality: Right;    Allergies: Miralax [polyethylene glycol] and Ceftriaxone  Medications: Prior to Admission medications   Medication Sig Start Date End Date Taking? Authorizing Provider  B Complex Vitamins (VITAMIN-B COMPLEX) TABS Take 1 tablet by mouth daily.    [provider]  benzonatate (TESSALON) 100 MG capsule Take 2 capsules (200 mg total) by mouth every 8 (eight) hours. 08/20/22   Becky Augusta, NP  Calcium Carbonate (CALCIUM 600 PO) Take by mouth.    [provider]  cetirizine (ZYRTEC) 10 MG tablet Take 10 mg by mouth daily.    [provider]  Cholecalciferol 4000 units CAPS Take 4,000 Units by mouth daily.    [provider]  clobetasol ointment (TEMOVATE) 0.05 % Apply 1 application topically as directed. qhs to aa prn flares, avoid face,  axilla Patient taking differently: Apply 1 application  topically daily as needed (flare ups). qhs to aa prn flares, avoid face, axilla 05/24/20   Willeen Niece, MD  docusate sodium (COLACE) 100 MG capsule Take 100 mg by mouth daily.    [provider]  enoxaparin (LOVENOX) 40 MG/0.4ML injection Inject 0.4 mLs (40 mg total) into the skin daily for 14 days. 02/19/21 03/05/21  Dedra Skeens, PA-C  fluticasone (FLONASE) 50 MCG/ACT nasal  spray Place 1 spray into both nostrils daily as needed for allergies or rhinitis.    [provider]  folic acid (FOLVITE) 1 MG tablet Take 1 mg by mouth daily.    [provider]  gabapentin (NEURONTIN) 100 MG capsule Take 100 mg by mouth 3 (three) times daily.    [provider]  HUMIRA 40 MG/0.8ML PSKT Inject 40 mg into the skin every 14 (fourteen) days. 03/11/18   [provider]  HYDROcodone-acetaminophen (NORCO/VICODIN) 5-325 MG tablet Take 1 tablet by mouth every 6 (six) hours as needed for moderate pain or severe pain. 03/24/23 03/23/24  Bridget Hartshorn L, PA-C  ipratropium (ATROVENT) 0.06 % nasal spray Place 2 sprays into both nostrils 4 (four) times daily. 08/20/22   Becky Augusta, NP  melatonin 5 MG TABS Take 5 mg by mouth at bedtime.    [provider]  methotrexate (RHEUMATREX) 2.5 MG tablet Take 12.5 mg by mouth once a week. Caution:Chemotherapy. Protect from light.    [provider]  metoprolol succinate (TOPROL-XL) 50 MG 24 hr tablet Take 50 mg by mouth daily. 09/23/16   [provider]  naproxen (NAPROSYN) 500 MG tablet Take 500 mg by mouth 2 (two) times daily as needed (pain).    [provider]  Omega-3 Fatty Acids (FISH OIL) 1200 MG CPDR Take 1,200 mg by mouth daily.    [provider]  omeprazole (PRILOSEC) 20 MG capsule Take 20 mg by mouth daily.    [provider]  predniSONE (DELTASONE) 5 MG tablet Take 5 mg by mouth every other day. Pt currently using 2.5 mg po QOD.    [provider]  promethazine-dextromethorphan (PROMETHAZINE-DM) 6.25-15 MG/5ML syrup Take 5 mLs by mouth 4 (four) times daily as needed. 08/20/22   Becky Augusta, NP  Turmeric 500 MG CAPS Take 500 mg by mouth daily.    [provider]  venlafaxine XR (EFFEXOR-XR) 75 MG 24 hr capsule Take 75 mg by mouth daily with breakfast.    [provider]  vitamin B-12 (CYANOCOBALAMIN) 100 MCG tablet Take 100 mcg  by mouth daily.    [provider]  zolpidem (AMBIEN) 5 MG tablet Take 5 mg by mouth at bedtime.    [provider]     Family History  Problem Relation Age of Onset   Breast cancer Mother 51    Social History   Socioeconomic History   Marital status: Married    Spouse name: Timmothy   Number of children: 1   Years of education: Not on file   Highest education level: Not on file  Occupational History   Not on file  Tobacco Use   Smoking status: Former    Packs/day: 0.50    Years: 10.00    Additional pack years: 0.00    Total pack years: 5.00    Types: Cigarettes    Quit date: 1985    Years since quitting: 39.5   Smokeless tobacco: Never  Vaping Use   Vaping Use: Never used  Substance and Sexual Activity   Alcohol use: Not Currently   Drug use: No   Sexual activity: Not on file  Other Topics Concern   Not on file  Social History Narrative   Not on file   Social Determinants of Health   Financial Resource Strain: Not on file  Food Insecurity: Not on file  Transportation Needs: Not on file  Physical Activity: Not on file  Stress: Not on file  Social Connections: Not on file    Review of Systems: A 12 point ROS discussed and pertinent positives are indicated in the HPI above.  All other systems are negative.  Review of Systems  Vital Signs: BP 139/79   Pulse 69   Temp 98.7 F (37.1 C)   Resp 16   SpO2 95%    Physical Exam Constitutional:      General: She is not in acute distress.    Appearance: Normal appearance. She is normal weight.  HENT:     Head: Normocephalic and atraumatic.  Eyes:     General: No scleral icterus. Cardiovascular:     Rate and Rhythm: Normal rate.  Pulmonary:     Effort: Pulmonary effort is normal.  Abdominal:     Palpations: Abdomen is soft.  Skin:    General: Skin is warm and dry.  Neurological:     Mental Status: She is alert and oriented to person, place, and time.  Psychiatric:        Mood and  Affect: Mood normal.        Behavior: Behavior normal.       Imaging: IR Radiologist Eval & Mgmt  Result Date: 05/29/2023 EXAM: NEW PATIENT OFFICE VISIT CHIEF COMPLAINT: SEE EPIC NOTE HISTORY OF PRESENT ILLNESS: SEE EPIC NOTE REVIEW OF SYSTEMS: SEE EPIC NOTE PHYSICAL EXAMINATION: SEE EPIC NOTE ASSESSMENT AND PLAN: SEE EPIC NOTE Electronically Signed   By: Malachy Moan M.D.   On: 05/29/2023 15:25   CT LUMBAR SPINE WO CONTRAST  Result Date: 05/17/2023 CLINICAL DATA:  Fall off a ladder.  Low back pain. EXAM: CT LUMBAR SPINE WITHOUT CONTRAST TECHNIQUE: Multidetector CT imaging of the lumbar spine was performed without intravenous contrast administration. Multiplanar CT image reconstructions were also generated. RADIATION DOSE REDUCTION: This exam was performed according to the departmental dose-optimization program which includes automated exposure control, adjustment of the mA and/or kV according to patient size and/or use of iterative reconstruction technique. 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 For this report, the lowest disc space is designated S1-S2 and is somewhat rudimentary. Rudimentary ribs arising from the L1 vertebral body. This is in keeping with a lumbar spine MRI report from 2021. Alignment: There is mild dextrocurvature centered at L3. Vertebrae: There is an acute appearing compression fracture of the L1 vertebral body with up to approximately 40% loss of vertebral body height anteriorly and minimal bony retropulsion but no spinal canal stenosis. There is no evidence of extension into the posterior elements. The other vertebral body heights are preserved. There is sclerosis along the SI joints bilaterally with curvilinear lucency, unchanged since 2021. There is no other evidence of acute fracture. There is no suspicious osseous lesion. Paraspinal and other soft tissues:  Unremarkable. Disc levels: There is multilevel disc space narrowing with vacuum disc phenomenon, most advanced at L3-L4 and L4-L5. There are mild disc bulges at L3-L4 through L5-S1 with associated endplate spurring. There is moderate facet arthropathy at  L4-L5, and advanced right and moderate left facet arthropathy at L5-S1. Findings result in up to mild spinal canal stenosis at L3-L4 and L4-L5, mild-to-moderate bilateral neural foraminal stenosis at L4-L5, and mild-to-moderate right neural foraminal stenosis at L5-S1. IMPRESSION: 1. 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 For this report, the lowest disc space which is rudimentary is designated S1-S2. 2. Acute appearing compression fracture of the L1 vertebral body with up to approximately 40% loss of vertebral body height anteriorly and minimal bony retropulsion but no spinal canal stenosis. 3. Multilevel degenerative changes as above with advanced right facet arthropathy at L5-S1. No high-grade spinal canal or neural foraminal stenosis. Electronically Signed   By: Lesia Hausen M.D.   On: 05/17/2023 15:48    Labs:  CBC: No results for input(s): "WBC", "HGB", "HCT", "PLT" in the last 8760 hours.  COAGS: No results for input(s): "INR", "APTT" in the last 8760 hours.  BMP: No results for input(s): "NA", "K", "CL", "CO2", "GLUCOSE", "BUN", "CALCIUM", "CREATININE", "GFRNONAA", "GFRAA" in the last 8760 hours.  Invalid input(s): "CMP"  LIVER FUNCTION TESTS: No results for input(s): "BILITOT", "AST", "ALT", "ALKPHOS", "PROT", "ALBUMIN" in the last 8760 hours.  TUMOR MARKERS: No results for input(s): "AFPTM", "CEA", "CA199", "CHROMGRNA" in the last 8760 hours.  Assessment and Plan:  Extremely pleasant 67 year old female with a history of a traumatic L1 compression fracture after falling backwards off of a ladder.  Fortunately, her pain is significantly improving consistent with healing.  She has recently begun physical  therapy and was able to complete the first session without any significant discomfort.  She takes nothing for pain and has essentially no symptoms other than when she is standing or walking for more extended periods of time.  At this time, I do not believe she needs cement augmentation.  However, if she has any setbacks or evidence of progressive height loss or acute on subacute fracture, I would be happy to see her again and consider kyphoplasty at that time.  She has my office number and will call if she experiences any setbacks or worsening of her clinical symptoms.  Thank you for this interesting consult.  I greatly enjoyed meeting LEGACIE DETORE and look forward to participating in their care.  A copy of this report was sent to the requesting provider on this date.  Electronically Signed: Sterling Big 05/29/2023, 5:00 PM   I spent a total of 40 Minutes  in face to face in clinical consultation, greater than 50% of which was counseling/coordinating care for L1 compression fracture.

## 2023-05-30 ENCOUNTER — Encounter: Payer: Self-pay | Admitting: Physical Therapy

## 2023-05-30 ENCOUNTER — Ambulatory Visit: Payer: Medicare PPO

## 2023-05-30 DIAGNOSIS — M5459 Other low back pain: Secondary | ICD-10-CM

## 2023-05-30 DIAGNOSIS — M6281 Muscle weakness (generalized): Secondary | ICD-10-CM

## 2023-05-30 DIAGNOSIS — S32010A Wedge compression fracture of first lumbar vertebra, initial encounter for closed fracture: Secondary | ICD-10-CM

## 2023-05-30 NOTE — Therapy (Signed)
OUTPATIENT PHYSICAL THERAPY THORACOLUMBAR TREATMENT   Patient Name: Janice Rowe MRN: [id number] DOB:[birth year], 67 y.o., female Today's Date: 05/30/2023  END OF SESSION:  PT End of Session - 05/30/23 1251     Visit Number 2    Number of Visits 16    Date for PT Re-Evaluation 07/23/23    Progress Note Due on Visit 10    PT Start Time 1255    PT Stop Time 1339    PT Time Calculation (min) 44 min    Activity Tolerance Patient tolerated treatment well    Behavior During Therapy [facility] for tasks assessed/performed              Past Medical History:  Diagnosis Date   Acute pancreatitis 11/2008   Anemia    Arthritis    GERD (gastroesophageal reflux disease)    Hiatal hernia    Hypertension    Rheumatoid arthritis (HCC)    Sleep apnea    uses c-pap   Vitamin D deficiency    Past Surgical History:  Procedure Laterality Date   CARPAL TUNNEL RELEASE Right 2013   COLONOSCOPY  2007   COLONOSCOPY WITH PROPOFOL N/A 11/29/2016   Procedure: COLONOSCOPY WITH PROPOFOL;  Surgeon: Earline Mayotte, MD;  Location: [facility];  Service: Endoscopy;  Laterality: N/A;   ESOPHAGOGASTRODUODENOSCOPY (EGD) WITH PROPOFOL N/A 11/29/2016   Procedure: ESOPHAGOGASTRODUODENOSCOPY (EGD) WITH PROPOFOL;  Surgeon: Earline Mayotte, MD;  Location: [facility];  Service: Endoscopy;  Laterality: N/A;   IR RADIOLOGIST EVAL & MGMT  05/29/2023   TOTAL HIP ARTHROPLASTY Right 02/18/2021   Procedure: TOTAL HIP ARTHROPLASTY ANTERIOR APPROACH;  Surgeon: Kennedy Bucker, MD;  Location: [facility] ORS;  Service: Orthopedics;  Laterality: Right;   Patient Active Problem List   Diagnosis Date Noted   S/P hip replacement 02/18/2021   S/P total hip arthroplasty 02/18/2021   Anemia 10/04/2018   Essential hypertension 10/04/2018   Rheumatoid arthritis with rheumatoid factor (HCC) 10/04/2018   Vitamin D deficiency 10/04/2018   Lung nodule seen on imaging study 08/21/2017   Benign microscopic hematuria 02/06/2017    Cardiac murmur 11/08/2016   Dysphagia 10/11/2016   Encounter for screening colonoscopy 10/11/2016   Rectal bleeding 10/11/2016   Ocular migraine 08/02/2016   Recurrent major depressive disorder, in full remission (HCC) 01/27/2016   Obstructive sleep apnea 09/15/2015    PCP: Lynnea Ferrier, MD   REFERRING PROVIDER: Ignacia Bayley, PA-C  REFERRING DIAG:  M54.50 (ICD-10-CM) - Low back pain, unspecified      Rationale for Evaluation and Treatment: Rehabilitation  THERAPY DIAG:  Other low back pain  Compression fracture of L1 vertebra, initial encounter (HCC)  Muscle weakness (generalized)  ONSET DATE: 03/24/23  SUBJECTIVE:  SUBJECTIVE STATEMENT: Patient reports using heat, ice, and rest initially after injury. Difficulty to stand >5 minutes, prolonged walking, gardening/watering flowers, and household chores. Has an appointment with an interventional radiologist to determine if she is a candidate for kyphoplasty. In standing, patient feels pulling on the R side of her back.  Has not quit completing day to day tasks since injury but has modified tasks (gardening, weed eating, painting, etc.)  PERTINENT HISTORY:  Patient was seen in MD office for continued low back pain since her fall from a ladder back in April. X-ray performed at that time showing severe arthritis but no fracture. Continues to have pain especially with walking. Relieved with sitting. Taking gabapentin. She does have some leg paresthesias, tingling.  CT on 05/17/23 reveals L1 compression and PA suggested potential kyphoplasty with patient wanting to do research prior to decision.   PAIN:  Are you having pain? Yes: NPRS scale: 7/10 Pain location: low back Pain description: pulling, pressure Aggravating factors: prolonged standing,  walking,  Relieving factors: sitting, rest   PRECAUTIONS: Back  WEIGHT BEARING RESTRICTIONS: No  FALLS:  Has patient fallen in last 6 months? Yes. Number of falls 1  LIVING ENVIRONMENT: Lives with: lives alone Lives in: House/apartment Stairs: No Has following equipment at home: Single point cane, Environmental consultant - 2 wheeled, and Ramped entry  OCCUPATION: retired   PLOF: Independent  PATIENT GOALS: pain reduction in the back   NEXT MD VISIT: 05/29/23 - with IR   OBJECTIVE:   DIAGNOSTIC FINDINGS:  CT on 05/17/23: Acute appearing compression fracture of the L1 vertebral body with up to approximately 40% loss of vertebral body height anteriorly and minimal bony retropulsion but no spinal canal stenosis.    PATIENT SURVEYS:  FOTO 46 with goal of 4  SCREENING FOR RED FLAGS: Bowel or bladder incontinence: No Spinal tumors: No Cauda equina syndrome: No Compression fracture: Yes: L1 compression Abdominal aneurysm: No  COGNITION: Overall cognitive status: Within functional limits for tasks assessed     SENSATION: WFL   MUSCLE LENGTH: Hamstrings: WFL    POSTURE: rounded shoulders and forward head  PALPATION: Paraspinal tightness T8-L5  LUMBAR ROM:   AROM eval  Flexion To mid shin  Extension 50%   Right lateral flexion To mid thigh  Left lateral flexion To mid thigh   Right rotation WFL  Left rotation WFL    (Blank rows = not tested)   LOWER EXTREMITY MMT:    MMT Right eval Left eval  Hip flexion 4 4  Hip extension 4 4  Hip abduction 4 4  Hip adduction 4+ 4+  Hip internal rotation    Hip external rotation    Knee flexion 4+ 4+  Knee extension 4+ 4+  Ankle dorsiflexion 4+ 4+  Ankle plantarflexion    Ankle inversion    Ankle eversion     (Blank rows = not tested)  LUMBAR SPECIAL TESTS:  Straight leg raise test: Negative and FABER test: Negative  FUNCTIONAL TESTS:  6 minute walk test: NT  GAIT: Distance walked: 25' Assistive device utilized:  None Level of assistance: Complete Independence Comments: guarded gait, decreased stance time bilaterally, stiff trunk   TODAY'S TREATMENT:  DATE: 05/30/23   Subjective: Patient reports IR MD states she does not need to have kyphoplasty and will benefit from physical therapy. He recommends her limit twisting and refrain from lifting + twisting.   Manual:  STM lumbar paraspinals L1-5 x 10 minutes   TE:  Nustep level 3 (seat 12) x 8 minutes for lumbar mobility and pain modulation  Reviewed HEP  Supine TrA activation with white physioball 2 x 10  with 3 second hold Supine TrA activation with white physioball with alternating UE lift 2 x 10   Standing paloff press BTB  x 15 each direction    PATIENT EDUCATION:  Education details: HEP, POC, goals  Person educated: Patient Education method: Explanation, Demonstration, and Handouts Education comprehension: verbalized understanding and returned demonstration  HOME EXERCISE PROGRAM: Access Code: [email or web address] URL: [email or web address] Date: 05/28/2023 Prepared by: Maylon Peppers  Exercises - Supine Posterior Pelvic Tilt  - 2-3 x daily - 5-7 x weekly - 3 sets - 10 reps - Supine Bridge  - 2-3 x daily - 5-7 x weekly - 3 sets - 10 reps - Bent Knee Fallouts  - 2-3 x daily - 5-7 x weekly - 3 sets - 10 reps  ASSESSMENT:  CLINICAL IMPRESSION:  Patient presents to treatment session motivated to participate with reports of 2/10 pain in low back. MD and patient decision to treat compression fx non-operatively. Session focused on STM and light core strengthening. MD recommends limiting twisting and combination of lifting and twisting. Patient will continue to benefit from skilled PT interventions to assist in minimizing pain to increase household and outdoor activities.   OBJECTIVE IMPAIRMENTS: Abnormal gait,  decreased activity tolerance, decreased balance, decreased endurance, decreased mobility, difficulty walking, decreased strength, increased muscle spasms, impaired flexibility, postural dysfunction, and pain.   ACTIVITY LIMITATIONS: lifting, bending, standing, squatting, and stairs  PARTICIPATION LIMITATIONS: cleaning, laundry, community activity, yard work, and church  PERSONAL FACTORS: Age, Behavior pattern, Past/current experiences, and Time since onset of injury/illness/exacerbation are also affecting patient's functional outcome.   REHAB POTENTIAL: Good  CLINICAL DECISION MAKING: Stable/uncomplicated  EVALUATION COMPLEXITY: Moderate   GOALS: Goals reviewed with patient? Yes  SHORT TERM GOALS: Target date: 06/25/2023  Patient will be independent in HEP to improve strength/mobility for better functional independence with ADLs and yard work Baseline: 7/1: see above Goal status: INITIAL   LONG TERM GOALS: Target date: 07/23/2023   Patient will increase FOTO score to equal to or greater than 63 to demonstrate statistically significant improvement in mobility and quality of life.  Baseline: 7/1: 46 Goal status: INITIAL  2.  Patient will increase BLE gross strength to greater than or equal to 4+/5 as to improve functional strength for independent gait, increased standing tolerance and increased ADL ability. Baseline: 7/1: see chart above Goal status: INITIAL  3.  Patient will be able to tolerate >15 minutes of standing while participating in household chores and yard work.  Baseline: 7/1: ~5 minutes before requiring seated rest break  Goal status: INITIAL  PLAN:  PT FREQUENCY: 1-2x/week  PT DURATION: 8 weeks  PLANNED INTERVENTIONS: Therapeutic exercises, Therapeutic activity, Neuromuscular re-education, Balance training, Gait training, Patient/Family education, Self Care, Joint mobilization, Aquatic Therapy, Dry Needling, Spinal mobilization, Cryotherapy, Moist heat, and  Manual therapy.  PLAN FOR NEXT SESSION: STM paraspinals, core strengthening    Maylon Peppers, PT, DPT Physical Therapist - [place]  [facility]  05/30/2023, 12:54 PM

## 2023-06-04 ENCOUNTER — Ambulatory Visit: Payer: Medicare PPO

## 2023-06-04 ENCOUNTER — Encounter: Payer: Self-pay | Admitting: Physical Therapy

## 2023-06-04 DIAGNOSIS — M5459 Other low back pain: Secondary | ICD-10-CM

## 2023-06-04 DIAGNOSIS — M6281 Muscle weakness (generalized): Secondary | ICD-10-CM

## 2023-06-04 DIAGNOSIS — S32010A Wedge compression fracture of first lumbar vertebra, initial encounter for closed fracture: Secondary | ICD-10-CM

## 2023-06-04 NOTE — Therapy (Signed)
OUTPATIENT PHYSICAL THERAPY THORACOLUMBAR TREATMENT   Patient Name: Janice Rowe MRN: [id number] DOB:06-19-1956, 67 y.o., female Today's Date: 06/04/2023  END OF SESSION:  PT End of Session - 06/04/23 1302     Visit Number 3    Number of Visits 16    Date for PT Re-Evaluation 07/23/23    Progress Note Due on Visit 10    PT Start Time 1300    PT Stop Time 1344    PT Time Calculation (min) 44 min    Activity Tolerance Patient tolerated treatment well    Behavior During Therapy [facility] for tasks assessed/performed               Past Medical History:  Diagnosis Date   Acute pancreatitis 11/2008   Anemia    Arthritis    GERD (gastroesophageal reflux disease)    Hiatal hernia    Hypertension    Rheumatoid arthritis (HCC)    Sleep apnea    uses c-pap   Vitamin D deficiency    Past Surgical History:  Procedure Laterality Date   CARPAL TUNNEL RELEASE Right 2013   COLONOSCOPY  2007   COLONOSCOPY WITH PROPOFOL N/A 11/29/2016   Procedure: COLONOSCOPY WITH PROPOFOL;  Surgeon: Earline Mayotte, MD;  Location: [facility];  Service: Endoscopy;  Laterality: N/A;   ESOPHAGOGASTRODUODENOSCOPY (EGD) WITH PROPOFOL N/A 11/29/2016   Procedure: ESOPHAGOGASTRODUODENOSCOPY (EGD) WITH PROPOFOL;  Surgeon: Earline Mayotte, MD;  Location: [facility];  Service: Endoscopy;  Laterality: N/A;   IR RADIOLOGIST EVAL & MGMT  05/29/2023   TOTAL HIP ARTHROPLASTY Right 02/18/2021   Procedure: TOTAL HIP ARTHROPLASTY ANTERIOR APPROACH;  Surgeon: Kennedy Bucker, MD;  Location: [facility] ORS;  Service: Orthopedics;  Laterality: Right;   Patient Active Problem List   Diagnosis Date Noted   S/P hip replacement 02/18/2021   S/P total hip arthroplasty 02/18/2021   Anemia 10/04/2018   Essential hypertension 10/04/2018   Rheumatoid arthritis with rheumatoid factor (HCC) 10/04/2018   Vitamin D deficiency 10/04/2018   Lung nodule seen on imaging study 08/21/2017   Benign microscopic hematuria 02/06/2017    Cardiac murmur 11/08/2016   Dysphagia 10/11/2016   Encounter for screening colonoscopy 10/11/2016   Rectal bleeding 10/11/2016   Ocular migraine 08/02/2016   Recurrent major depressive disorder, in full remission (HCC) 01/27/2016   Obstructive sleep apnea 09/15/2015    PCP: Lynnea Ferrier, MD   REFERRING PROVIDER: Ignacia Bayley, PA-C  REFERRING DIAG:  M54.50 (ICD-10-CM) - Low back pain, unspecified      Rationale for Evaluation and Treatment: Rehabilitation  THERAPY DIAG:  Other low back pain  Compression fracture of L1 vertebra, initial encounter (HCC)  Muscle weakness (generalized)  ONSET DATE: 03/24/23  SUBJECTIVE:  SUBJECTIVE STATEMENT: Patient reports using heat, ice, and rest initially after injury. Difficulty to stand >5 minutes, prolonged walking, gardening/watering flowers, and household chores. Has an appointment with an interventional radiologist to determine if she is a candidate for kyphoplasty. In standing, patient feels pulling on the R side of her back.  Has not quit completing day to day tasks since injury but has modified tasks (gardening, weed eating, painting, etc.)  PERTINENT HISTORY:  Patient was seen in MD office for continued low back pain since her fall from a ladder back in April. X-ray performed at that time showing severe arthritis but no fracture. Continues to have pain especially with walking. Relieved with sitting. Taking gabapentin. She does have some leg paresthesias, tingling.  CT on 05/17/23 reveals L1 compression and PA suggested potential kyphoplasty with patient wanting to do research prior to decision.   PAIN:  Are you having pain? Yes: NPRS scale: 7/10 Pain location: low back Pain description: pulling, pressure Aggravating factors: prolonged standing,  walking,  Relieving factors: sitting, rest   PRECAUTIONS: Back  WEIGHT BEARING RESTRICTIONS: No  FALLS:  Has patient fallen in last 6 months? Yes. Number of falls 1  LIVING ENVIRONMENT: Lives with: lives alone Lives in: House/apartment Stairs: No Has following equipment at home: Single point cane, Environmental consultant - 2 wheeled, and Ramped entry  OCCUPATION: retired   PLOF: Independent  PATIENT GOALS: pain reduction in the back   NEXT MD VISIT: 05/29/23 - with IR   OBJECTIVE:   DIAGNOSTIC FINDINGS:  CT on 05/17/23: Acute appearing compression fracture of the L1 vertebral body with up to approximately 40% loss of vertebral body height anteriorly and minimal bony retropulsion but no spinal canal stenosis.    PATIENT SURVEYS:  FOTO 46 with goal of 39  SCREENING FOR RED FLAGS: Bowel or bladder incontinence: No Spinal tumors: No Cauda equina syndrome: No Compression fracture: Yes: L1 compression Abdominal aneurysm: No  COGNITION: Overall cognitive status: Within functional limits for tasks assessed     SENSATION: WFL   MUSCLE LENGTH: Hamstrings: WFL    POSTURE: rounded shoulders and forward head  PALPATION: Paraspinal tightness T8-L5  LUMBAR ROM:   AROM eval  Flexion To mid shin  Extension 50%   Right lateral flexion To mid thigh  Left lateral flexion To mid thigh   Right rotation WFL  Left rotation WFL    (Blank rows = not tested)   LOWER EXTREMITY MMT:    MMT Right eval Left eval  Hip flexion 4 4  Hip extension 4 4  Hip abduction 4 4  Hip adduction 4+ 4+  Hip internal rotation    Hip external rotation    Knee flexion 4+ 4+  Knee extension 4+ 4+  Ankle dorsiflexion 4+ 4+  Ankle plantarflexion    Ankle inversion    Ankle eversion     (Blank rows = not tested)  LUMBAR SPECIAL TESTS:  Straight leg raise test: Negative and FABER test: Negative  FUNCTIONAL TESTS:  6 minute walk test: NT  GAIT: Distance walked: 25' Assistive device utilized:  None Level of assistance: Complete Independence Comments: guarded gait, decreased stance time bilaterally, stiff trunk   TODAY'S TREATMENT:  DATE: 06/04/23   Subjective: Patient reports back pain as mild upon arrival.   Manual:  STM lumbar paraspinals L1-5 x 10 minutes   TE:  Nustep level 3 (seat 12) x 8 minutes for lumbar mobility and pain modulation  Supine marching 2 x 15 Supine bridges 2 x 15  Supine TrA activation with white physioball 2 x 15  with 3 second hold Supine TrA activation with white physioball with alternating UE lift 2 x 15 Quadruped cat/camel x 15  Quadruped bird dog 2 x 10   PATIENT EDUCATION:  Education details: HEP, POC, goals  Person educated: Patient Education method: Explanation, Demonstration, and Handouts Education comprehension: verbalized understanding and returned demonstration  HOME EXERCISE PROGRAM: Access Code: [email or web address] URL: [email or web address] Date: 05/28/2023 Prepared by: Maylon Peppers  Exercises - Supine Posterior Pelvic Tilt  - 2-3 x daily - 5-7 x weekly - 3 sets - 10 reps - Supine Bridge  - 2-3 x daily - 5-7 x weekly - 3 sets - 10 reps - Bent Knee Fallouts  - 2-3 x daily - 5-7 x weekly - 3 sets - 10 reps  Access Code: [email or web address] URL: [email or web address] Date: 06/04/2023 Prepared by: Maylon Peppers  Exercises - Bird Dog  - 2-3 x daily - 5-7 x weekly - 3 sets - 10 reps - Cat Cow  - 2-3 x daily - 5-7 x weekly - 3 sets - 10 reps  ASSESSMENT:  CLINICAL IMPRESSION:    Patient arrives to treatment session motivated and reports of mild back pain. Session focused on core strengthening and manual STM of lumbar paraspinals. Good tolerance to exercises in supine and quadruped. Handout provided to patient on quadruped exercises. Patient will continue to benefit from skilled PT interventions  to assist in minimizing pain to increase household and outdoor activities.   OBJECTIVE IMPAIRMENTS: Abnormal gait, decreased activity tolerance, decreased balance, decreased endurance, decreased mobility, difficulty walking, decreased strength, increased muscle spasms, impaired flexibility, postural dysfunction, and pain.   ACTIVITY LIMITATIONS: lifting, bending, standing, squatting, and stairs  PARTICIPATION LIMITATIONS: cleaning, laundry, community activity, yard work, and church  PERSONAL FACTORS: Age, Behavior pattern, Past/current experiences, and Time since onset of injury/illness/exacerbation are also affecting patient's functional outcome.   REHAB POTENTIAL: Good  CLINICAL DECISION MAKING: Stable/uncomplicated  EVALUATION COMPLEXITY: Moderate   GOALS: Goals reviewed with patient? Yes  SHORT TERM GOALS: Target date: 06/25/2023  Patient will be independent in HEP to improve strength/mobility for better functional independence with ADLs and yard work Baseline: 7/1: see above Goal status: INITIAL   LONG TERM GOALS: Target date: 07/23/2023   Patient will increase FOTO score to equal to or greater than 63 to demonstrate statistically significant improvement in mobility and quality of life.  Baseline: 7/1: 46 Goal status: INITIAL  2.  Patient will increase BLE gross strength to greater than or equal to 4+/5 as to improve functional strength for independent gait, increased standing tolerance and increased ADL ability. Baseline: 7/1: see chart above Goal status: INITIAL  3.  Patient will be able to tolerate >15 minutes of standing while participating in household chores and yard work.  Baseline: 7/1: ~5 minutes before requiring seated rest break  Goal status: INITIAL  PLAN:  PT FREQUENCY: 1-2x/week  PT DURATION: 8 weeks  PLANNED INTERVENTIONS: Therapeutic exercises, Therapeutic activity, Neuromuscular re-education, Balance training, Gait training, Patient/Family  education, Self Care, Joint mobilization, Aquatic Therapy, Dry Needling, Spinal mobilization, Cryotherapy, Moist heat, and Manual therapy.  PLAN FOR NEXT SESSION: STM paraspinals, core strengthening  Maylon Peppers, PT, DPT Physical Therapist - [facility]  06/04/2023, 1:53 PM

## 2023-06-06 ENCOUNTER — Ambulatory Visit: Payer: Medicare PPO

## 2023-06-06 ENCOUNTER — Encounter: Payer: Self-pay | Admitting: Dermatology

## 2023-06-06 ENCOUNTER — Encounter: Payer: Self-pay | Admitting: Physical Therapy

## 2023-06-06 ENCOUNTER — Ambulatory Visit: Payer: Medicare PPO | Admitting: Dermatology

## 2023-06-06 VITALS — BP 139/79

## 2023-06-06 DIAGNOSIS — M5459 Other low back pain: Secondary | ICD-10-CM

## 2023-06-06 DIAGNOSIS — Z1283 Encounter for screening for malignant neoplasm of skin: Secondary | ICD-10-CM

## 2023-06-06 DIAGNOSIS — D225 Melanocytic nevi of trunk: Secondary | ICD-10-CM

## 2023-06-06 DIAGNOSIS — S32010A Wedge compression fracture of first lumbar vertebra, initial encounter for closed fracture: Secondary | ICD-10-CM

## 2023-06-06 DIAGNOSIS — M6281 Muscle weakness (generalized): Secondary | ICD-10-CM

## 2023-06-06 DIAGNOSIS — D3611 Benign neoplasm of peripheral nerves and autonomic nervous system of face, head, and neck: Secondary | ICD-10-CM

## 2023-06-06 DIAGNOSIS — D229 Melanocytic nevi, unspecified: Secondary | ICD-10-CM

## 2023-06-06 DIAGNOSIS — W908XXA Exposure to other nonionizing radiation, initial encounter: Secondary | ICD-10-CM

## 2023-06-06 DIAGNOSIS — L821 Other seborrheic keratosis: Secondary | ICD-10-CM

## 2023-06-06 DIAGNOSIS — L900 Lichen sclerosus et atrophicus: Secondary | ICD-10-CM

## 2023-06-06 DIAGNOSIS — L578 Other skin changes due to chronic exposure to nonionizing radiation: Secondary | ICD-10-CM

## 2023-06-06 DIAGNOSIS — L814 Other melanin hyperpigmentation: Secondary | ICD-10-CM | POA: Diagnosis not present

## 2023-06-06 DIAGNOSIS — D485 Neoplasm of uncertain behavior of skin: Secondary | ICD-10-CM

## 2023-06-06 NOTE — Progress Notes (Signed)
Follow-Up Visit   Subjective  Janice Rowe is a 67 y.o. female who presents for the following: Skin Cancer Screening and Full Body Skin Exam  The patient presents for Total-Body Skin Exam (TBSE) for skin cancer screening and mole check. The patient has spots, moles and lesions to be evaluated, some may be new or changing and the patient may have concern these could be cancer.  Patient with a spot at right temple that is irritating for her, a spot at right side that is getting scalier, a spot at vaginal area that she thinks could be changing.   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 Patient advises well controlled.   The following portions of the chart were reviewed this encounter and updated as appropriate: medications, allergies, medical history  Review of Systems:  No other skin or systemic complaints except as noted in HPI or Assessment and Plan.  Objective  Well appearing patient in no apparent distress; mood and affect are within normal limits.  A full examination was performed including scalp, head, eyes, ears, nose, lips, neck, chest, axillae, abdomen, back, buttocks, bilateral upper extremities, bilateral lower extremities, hands, feet, fingers, toes, fingernails, and toenails. All findings within normal limits unless otherwise noted below.   Relevant physical exam findings are noted in the Assessment and Plan.  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  ACTINIC DAMAGE - Chronic condition, secondary to cumulative UV/sun exposure - diffuse scaly erythematous macules with underlying dyspigmentation - Recommend daily broad spectrum sunscreen SPF 30+ to sun-exposed areas, reapply every 2 hours as needed.  - Staying in the shade or wearing long sleeves, sun glasses (UVA+UVB protection) and wide brim hats  (4-inch brim around the entire circumference of the hat) are also recommended for sun protection.  - Call for new or changing lesions.  LENTIGINES, SEBORRHEIC KERATOSES, HEMANGIOMAS - Benign normal skin lesions - Benign-appearing - Call for any changes - SK at R perineal groin, Benign, not bothersome, observe.    MELANOCYTIC NEVI - Tan-brown and/or pink-flesh-colored symmetric macules and papules - Benign appearing on exam today - Observation - Call clinic for new or changing moles - Recommend daily use of broad spectrum spf 30+ sunscreen to sun-exposed areas.  NEVUS Exam: - 2 mm medium dark brown macule at right lower back  Treatment Plan: Benign appearing on exam today. Recommend observation. Call clinic for new or changing moles. Recommend daily use of broad spectrum spf 30+ sunscreen to sun-exposed areas.    Neoplasm of uncertain behavior of skin right temple  Epidermal / dermal shaving  Lesion diameter (cm):  0.4 Informed consent: discussed and consent obtained   Patient was prepped and draped in usual sterile fashion: area prepped with alcohol. 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 Condition is symptomatic/ bothersome to patient. Not currently at goal.   Continue clobetasol 0.05% ointment, increasing to once weekly for maintenance  and increase to daily prn symptoms.  Topical steroids (such as triamcinolone, fluocinolone, fluocinonide, mometasone, clobetasol, halobetasol, betamethasone, hydrocortisone) can cause thinning and lightening of the skin if they are used for too long in the same area. Your physician has selected the right strength medicine for your problem and area affected on the body. Please use your medication only as directed by your physician to prevent side effects.    Related Medications clobetasol ointment (TEMOVATE) 0.05 % Apply 1 application topically as directed. qhs to aa prn flares, avoid face, axilla   Return in about 1 year (around 06/05/2024) for TBSE.  Anise Salvo, RMA, am acting as scribe for Willeen Niece, MD .   Documentation: I have reviewed the above documentation for accuracy and completeness, and I agree with the above.  Willeen Niece, MD

## 2023-06-06 NOTE — Therapy (Signed)
OUTPATIENT PHYSICAL THERAPY THORACOLUMBAR TREATMENT   Patient Name: Janice Rowe MRN: [id number] DOB:Apr 26, 1956, 67 y.o., female Today's Date: 06/06/2023  END OF SESSION:  PT End of Session - 06/06/23 1248     Visit Number 4    Number of Visits 16    Date for PT Re-Evaluation 07/23/23    Progress Note Due on Visit 10    PT Start Time 1250    PT Stop Time 1335    PT Time Calculation (min) 45 min    Activity Tolerance Patient tolerated treatment well    Behavior During Therapy [facility] for tasks assessed/performed                Past Medical History:  Diagnosis Date   Acute pancreatitis 11/2008   Anemia    Arthritis    GERD (gastroesophageal reflux disease)    Hiatal hernia    Hypertension    Rheumatoid arthritis (HCC)    Sleep apnea    uses c-pap   Vitamin D deficiency    Past Surgical History:  Procedure Laterality Date   CARPAL TUNNEL RELEASE Right 2013   COLONOSCOPY  2007   COLONOSCOPY WITH PROPOFOL N/A 11/29/2016   Procedure: COLONOSCOPY WITH PROPOFOL;  Surgeon: Earline Mayotte, MD;  Location: [facility] ENDOSCOPY;  Service: Endoscopy;  Laterality: N/A;   ESOPHAGOGASTRODUODENOSCOPY (EGD) WITH PROPOFOL N/A 11/29/2016   Procedure: ESOPHAGOGASTRODUODENOSCOPY (EGD) WITH PROPOFOL;  Surgeon: Earline Mayotte, MD;  Location: [facility] ENDOSCOPY;  Service: Endoscopy;  Laterality: N/A;   IR RADIOLOGIST EVAL & MGMT  05/29/2023   TOTAL HIP ARTHROPLASTY Right 02/18/2021   Procedure: TOTAL HIP ARTHROPLASTY ANTERIOR APPROACH;  Surgeon: Kennedy Bucker, MD;  Location: [facility] ORS;  Service: Orthopedics;  Laterality: Right;   Patient Active Problem List   Diagnosis Date Noted   S/P hip replacement 02/18/2021   S/P total hip arthroplasty 02/18/2021   Anemia 10/04/2018   Essential hypertension 10/04/2018   Rheumatoid arthritis with rheumatoid factor (HCC) 10/04/2018   Vitamin D deficiency 10/04/2018   Lung nodule seen on imaging study 08/21/2017   Benign microscopic hematuria 02/06/2017    Cardiac murmur 11/08/2016   Dysphagia 10/11/2016   Encounter for screening colonoscopy 10/11/2016   Rectal bleeding 10/11/2016   Ocular migraine 08/02/2016   Recurrent major depressive disorder, in full remission (HCC) 01/27/2016   Obstructive sleep apnea 09/15/2015    PCP: Lynnea Ferrier, MD   REFERRING PROVIDER: Ignacia Bayley, PA-C  REFERRING DIAG:  M54.50 (ICD-10-CM) - Low back pain, unspecified      Rationale for Evaluation and Treatment: Rehabilitation  THERAPY DIAG:  Other low back pain  Compression fracture of L1 vertebra, initial encounter (HCC)  Muscle weakness (generalized)  ONSET DATE: 03/24/23  SUBJECTIVE:  SUBJECTIVE STATEMENT: Patient reports using heat, ice, and rest initially after injury. Difficulty to stand >5 minutes, prolonged walking, gardening/watering flowers, and household chores. Has an appointment with an interventional radiologist to determine if she is a candidate for kyphoplasty. In standing, patient feels pulling on the R side of her back.  Has not quit completing day to day tasks since injury but has modified tasks (gardening, weed eating, painting, etc.)  PERTINENT HISTORY:  Patient was seen in MD office for continued low back pain since her fall from a ladder back in April. X-ray performed at that time showing severe arthritis but no fracture. Continues to have pain especially with walking. Relieved with sitting. Taking gabapentin. She does have some leg paresthesias, tingling.  CT on 05/17/23 reveals L1 compression and PA suggested potential kyphoplasty with patient wanting to do research prior to decision.   PAIN:  Are you having pain? Yes: NPRS scale: 7/10 Pain location: low back Pain description: pulling, pressure Aggravating factors: prolonged standing,  walking,  Relieving factors: sitting, rest   PRECAUTIONS: Back  WEIGHT BEARING RESTRICTIONS: No  FALLS:  Has patient fallen in last 6 months? Yes. Number of falls 1  LIVING ENVIRONMENT: Lives with: lives alone Lives in: House/apartment Stairs: No Has following equipment at home: Single point cane, Environmental consultant - 2 wheeled, and Ramped entry  OCCUPATION: retired   PLOF: Independent  PATIENT GOALS: pain reduction in the back   NEXT MD VISIT: 05/29/23 - with IR   OBJECTIVE:   DIAGNOSTIC FINDINGS:  CT on 05/17/23: Acute appearing compression fracture of the L1 vertebral body with up to approximately 40% loss of vertebral body height anteriorly and minimal bony retropulsion but no spinal canal stenosis.    PATIENT SURVEYS:  FOTO 46 with goal of 67  SCREENING FOR RED FLAGS: Bowel or bladder incontinence: No Spinal tumors: No Cauda equina syndrome: No Compression fracture: Yes: L1 compression Abdominal aneurysm: No  COGNITION: Overall cognitive status: Within functional limits for tasks assessed     SENSATION: WFL   MUSCLE LENGTH: Hamstrings: WFL    POSTURE: rounded shoulders and forward head  PALPATION: Paraspinal tightness T8-L5  LUMBAR ROM:   AROM eval  Flexion To mid shin  Extension 50%   Right lateral flexion To mid thigh  Left lateral flexion To mid thigh   Right rotation WFL  Left rotation WFL    (Blank rows = not tested)   LOWER EXTREMITY MMT:    MMT Right eval Left eval  Hip flexion 4 4  Hip extension 4 4  Hip abduction 4 4  Hip adduction 4+ 4+  Hip internal rotation    Hip external rotation    Knee flexion 4+ 4+  Knee extension 4+ 4+  Ankle dorsiflexion 4+ 4+  Ankle plantarflexion    Ankle inversion    Ankle eversion     (Blank rows = not tested)  LUMBAR SPECIAL TESTS:  Straight leg raise test: Negative and FABER test: Negative  FUNCTIONAL TESTS:  6 minute walk test: NT  GAIT: Distance walked: 25' Assistive device utilized:  None Level of assistance: Complete Independence Comments: guarded gait, decreased stance time bilaterally, stiff trunk   TODAY'S TREATMENT:  DATE: 06/06/23   Subjective: Patient reports mild back pain on R side and feels she may have over did it.   Manual:  STM lumbar paraspinals L1-5 x 10 minutes   TE:  Nustep level 3 (seat 12) x 8 minutes for lumbar mobility and pain modulation (not today) Prone supermans x 10  Quadruped cat/cow x 15  Quadruped bird dog 2 x 10  Supine marching 2 x 15 with BTB around knees  Supine bridges 2 x 15 with BTB around knees  Sidelying clamshells with BTB 2 x 15 on each side   Not today:  Supine TrA activation with white physioball 2 x 15  with 3 second hold Supine TrA activation with white physioball with alternating UE lift 2 x 15  PATIENT EDUCATION:  Education details: HEP, POC, goals  Person educated: Patient Education method: Explanation, Demonstration, and Handouts Education comprehension: verbalized understanding and returned demonstration  HOME EXERCISE PROGRAM: Access Code: [email or web address] URL: [email or web address] Date: 05/28/2023, 06/04/2023, 06/06/2023 Prepared by: Maylon Peppers  Exercises - Supine Posterior Pelvic Tilt  - 2-3 x daily - 5-7 x weekly - 3 sets - 10 reps - Supine Bridge  - 2-3 x daily - 5-7 x weekly - 3 sets - 10 reps - Bent Knee Fallouts  - 2-3 x daily - 5-7 x weekly - 3 sets - 10 reps - Supine Bridge with Resistance Band  - 2-3 x daily - 5-7 x weekly - 3 sets - 10 reps - Supine March with Resistance Band  - 2-3 x daily - 5-7 x weekly - 3 sets - 10 reps - Clamshell with Resistance  - 2-3 x daily - 5-7 x weekly - 3 sets - 10 reps - Bird Dog  - 2-3 x daily - 5-7 x weekly - 3 sets - 10 reps - Cat Cow  - 2-3 x daily - 5-7 x weekly - 3 sets - 10 reps  ASSESSMENT:  CLINICAL IMPRESSION:     Patient arrives to treatment session motivated with continued reports of mild back pain. Session focused on manual STM of lumbar paraspinals and core/hip strengthening with resistance. Able to tolerate exercises well with no increase in back pain. Patient will continue to benefit from skilled PT interventions to assist in minimizing pain to increase household and outdoor activities.     OBJECTIVE IMPAIRMENTS: Abnormal gait, decreased activity tolerance, decreased balance, decreased endurance, decreased mobility, difficulty walking, decreased strength, increased muscle spasms, impaired flexibility, postural dysfunction, and pain.   ACTIVITY LIMITATIONS: lifting, bending, standing, squatting, and stairs  PARTICIPATION LIMITATIONS: cleaning, laundry, community activity, yard work, and church  PERSONAL FACTORS: Age, Behavior pattern, Past/current experiences, and Time since onset of injury/illness/exacerbation are also affecting patient's functional outcome.   REHAB POTENTIAL: Good  CLINICAL DECISION MAKING: Stable/uncomplicated  EVALUATION COMPLEXITY: Moderate   GOALS: Goals reviewed with patient? Yes  SHORT TERM GOALS: Target date: 06/25/2023  Patient will be independent in HEP to improve strength/mobility for better functional independence with ADLs and yard work Baseline: 7/1: see above Goal status: INITIAL   LONG TERM GOALS: Target date: 07/23/2023   Patient will increase FOTO score to equal to or greater than 63 to demonstrate statistically significant improvement in mobility and quality of life.  Baseline: 7/1: 46 Goal status: INITIAL  2.  Patient will increase BLE gross strength to greater than or equal to 4+/5 as to improve functional strength for independent gait, increased standing tolerance and increased ADL ability. Baseline: 7/1: see chart above Goal status:  INITIAL  3.  Patient will be able to tolerate >15 minutes of standing while participating in household chores  and yard work.  Baseline: 7/1: ~5 minutes before requiring seated rest break  Goal status: INITIAL  PLAN:  PT FREQUENCY: 1-2x/week  PT DURATION: 8 weeks  PLANNED INTERVENTIONS: Therapeutic exercises, Therapeutic activity, Neuromuscular re-education, Balance training, Gait training, Patient/Family education, Self Care, Joint mobilization, Aquatic Therapy, Dry Needling, Spinal mobilization, Cryotherapy, Moist heat, and Manual therapy.  PLAN FOR NEXT SESSION: STM paraspinals, core strengthening    Maylon Peppers, PT, DPT Physical Therapist -   [facility]  06/06/2023, 12:49 PM

## 2023-06-06 NOTE — Patient Instructions (Addendum)
Wound Care Instructions  Cleanse wound gently with soap and water once a day then pat dry with clean gauze. Apply a thin coat of Petrolatum (petroleum jelly, "Vaseline") over the wound (unless you have an allergy to this). We recommend that you use a new, sterile tube of Vaseline. Do not pick or remove scabs. Do not remove the yellow or white "healing tissue" from the base of the wound.  Cover the wound with fresh, clean, nonstick gauze and secure with paper tape. You may use Band-Aids in place of gauze and tape if the wound is small enough, but would recommend trimming much of the tape off as there is often too much. Sometimes Band-Aids can irritate the skin.  You should call the office for your biopsy report after 1 week if you have not already been contacted.  If you experience any problems, such as abnormal amounts of bleeding, swelling, significant bruising, significant pain, or evidence of infection, please call the office immediately.  FOR ADULT SURGERY PATIENTS: If you need something for pain relief you may take 1 extra strength Tylenol (acetaminophen) AND 2 Ibuprofen (200mg  each) together every 4 hours as needed for pain. (do not take these if you are allergic to them or if you have a reason you should not take them.) Typically, you may only need pain medication for 1 to 3 days.   Continue clobetasol 0.05% ointment increasing to once weekly.   Topical steroids (such as triamcinolone, fluocinolone, fluocinonide, mometasone, clobetasol, halobetasol, betamethasone, hydrocortisone) can cause thinning and lightening of the skin if they are used for too long in the same area. Your physician has selected the right strength medicine for your problem and area affected on the body. Please use your medication only as directed by your physician to prevent side effects.    Seborrheic Keratosis  What causes seborrheic keratoses? Seborrheic keratoses are harmless, common skin growths that first appear  during adult life.  As time goes by, more growths appear.  Some people may develop a large number of them.  Seborrheic keratoses appear on both covered and uncovered body parts.  They are not caused by sunlight.  The tendency to develop seborrheic keratoses can be inherited.  They vary in color from skin-colored to gray, brown, or even black.  They can be either smooth or have a rough, warty surface.   Seborrheic keratoses are superficial and look as if they were stuck on the skin.  Under the microscope this type of keratosis looks like layers upon layers of skin.  That is why at times the top layer may seem to fall off, but the rest of the growth remains and re-grows.    Treatment Seborrheic keratoses do not need to be treated, but can easily be removed in the office.  Seborrheic keratoses often cause symptoms when they rub on clothing or jewelry.  Lesions can be in the way of shaving.  If they become inflamed, they can cause itching, soreness, or burning.  Removal of a seborrheic keratosis can be accomplished by freezing, burning, or surgery. If any spot bleeds, scabs, or grows rapidly, please return to have it checked, as these can be an indication of a skin cancer.  Melanoma ABCDEs  Melanoma is the most dangerous type of skin cancer, and is the leading cause of death from skin disease.  You are more likely to develop melanoma if you: Have light-colored skin, light-colored eyes, or red or blond hair Spend a lot of time in the sun  Tan regularly, either outdoors or in a tanning bed Have had blistering sunburns, especially during childhood Have a close family member who has had a melanoma Have atypical moles or large birthmarks  Early detection of melanoma is key since treatment is typically straightforward and cure rates are extremely high if we catch it early.   The first sign of melanoma is often a change in a mole or a new dark spot.  The ABCDE system is a way of remembering the signs of  melanoma.  A for asymmetry:  The two halves do not match. B for border:  The edges of the growth are irregular. C for color:  A mixture of colors are present instead of an even brown color. D for diameter:  Melanomas are usually (but not always) greater than 6mm - the size of a pencil eraser. E for evolution:  The spot keeps changing in size, shape, and color.  Please check your skin once per month between visits. You can use a small mirror in front and a large mirror behind you to keep an eye on the back side or your body.   If you see any new or changing lesions before your next follow-up, please call to schedule a visit.  Please continue daily skin protection including broad spectrum sunscreen SPF 30+ to sun-exposed areas, reapplying every 2 hours as needed when you're outdoors.    Due to recent changes in healthcare laws, you may see results of your pathology and/or laboratory studies on MyChart before the doctors have had a chance to review them. We understand that in some cases there may be results that are confusing or concerning to you. Please understand that not all results are received at the same time and often the doctors may need to interpret multiple results in order to provide you with the best plan of care or course of treatment. Therefore, we ask that you please give [place] 2 business days to thoroughly review all your results before contacting the office for clarification. Should we see a critical lab result, you will be contacted sooner.   If You Need Anything After Your Visit  If you have any questions or concerns for your doctor, please call our main line at [phone] and press option 4 to reach your doctor's medical assistant. If no one answers, please leave a voicemail as directed and we will return your call as soon as possible. Messages left after 4 pm will be answered the following business day.   You may also send [place] a message via MyChart. We typically respond to  MyChart messages within 1-2 business days.  For prescription refills, please ask your pharmacy to contact our office. Our fax number is [phone].  If you have an urgent issue when the clinic is closed that cannot wait until the next business day, you can page your doctor at the number below.    Please note that while we do our best to be available for urgent issues outside of office hours, we are not available 24/7.   If you have an urgent issue and are unable to reach [place], you may choose to seek medical care at your doctor's office, retail clinic, urgent care center, or emergency room.  If you have a medical emergency, please immediately call 911 or go to the emergency department.  Pager Numbers  - Dr. Gwen Pounds: [phone]  - Dr. Neale Burly: [phone]  - Dr. Roseanne Reno: [phone]  In the event of inclement weather, please call our  main line at [phone] for an update on the status of any delays or closures.  Dermatology Medication Tips: Please keep the boxes that topical medications come in in order to help keep track of the instructions about where and how to use these. Pharmacies typically print the medication instructions only on the boxes and not directly on the medication tubes.   If your medication is too expensive, please contact our office at [phone] option 4 or send [place] a message through MyChart.   We are unable to tell what your co-pay for medications will be in advance as this is different depending on your insurance coverage. However, we may be able to find a substitute medication at lower cost or fill out paperwork to get insurance to cover a needed medication.   If a prior authorization is required to get your medication covered by your insurance company, please allow [place] 1-2 business days to complete this process.  Drug prices often vary depending on where the prescription is filled and some pharmacies may offer cheaper prices.  The website www.goodrx.com  contains coupons for medications through different pharmacies. The prices here do not account for what the cost may be with help from insurance (it may be cheaper with your insurance), but the website can give you the price if you did not use any insurance.  - You can print the associated coupon and take it with your prescription to the pharmacy.  - You may also stop by our office during regular business hours and pick up a GoodRx coupon card.  - If you need your prescription sent electronically to a different pharmacy, notify our office through [facility] or by phone at [phone] option 4.

## 2023-06-11 ENCOUNTER — Ambulatory Visit: Payer: Medicare PPO | Attending: Physician Assistant | Admitting: Physical Therapy

## 2023-06-11 DIAGNOSIS — M5459 Other low back pain: Secondary | ICD-10-CM | POA: Insufficient documentation

## 2023-06-11 DIAGNOSIS — S32010A Wedge compression fracture of first lumbar vertebra, initial encounter for closed fracture: Secondary | ICD-10-CM | POA: Insufficient documentation

## 2023-06-11 DIAGNOSIS — M6281 Muscle weakness (generalized): Secondary | ICD-10-CM | POA: Insufficient documentation

## 2023-06-11 NOTE — Therapy (Signed)
OUTPATIENT PHYSICAL THERAPY THORACOLUMBAR TREATMENT   Patient Name: Janice Rowe MRN: [id number] DOB:Aug 12, 1956, 67 y.o., female Today's Date: 06/11/2023  END OF SESSION:  PT End of Session - 06/11/23 1342     Visit Number 5    Number of Visits 16    Date for PT Re-Evaluation 07/23/23    Progress Note Due on Visit 10    PT Start Time 1342    PT Stop Time 1436    PT Time Calculation (min) 54 min    Activity Tolerance Patient tolerated treatment well    Behavior During Therapy [facility] for tasks assessed/performed                Past Medical History:  Diagnosis Date   Acute pancreatitis 11/2008   Anemia    Arthritis    GERD (gastroesophageal reflux disease)    Hiatal hernia    Hypertension    Rheumatoid arthritis (HCC)    Sleep apnea    uses c-pap   Vitamin D deficiency    Past Surgical History:  Procedure Laterality Date   CARPAL TUNNEL RELEASE Right 2013   COLONOSCOPY  2007   COLONOSCOPY WITH PROPOFOL N/A 11/29/2016   Procedure: COLONOSCOPY WITH PROPOFOL;  Surgeon: Earline Mayotte, MD;  Location: [facility] ENDOSCOPY;  Service: Endoscopy;  Laterality: N/A;   ESOPHAGOGASTRODUODENOSCOPY (EGD) WITH PROPOFOL N/A 11/29/2016   Procedure: ESOPHAGOGASTRODUODENOSCOPY (EGD) WITH PROPOFOL;  Surgeon: Earline Mayotte, MD;  Location: [facility] ENDOSCOPY;  Service: Endoscopy;  Laterality: N/A;   IR RADIOLOGIST EVAL & MGMT  05/29/2023   TOTAL HIP ARTHROPLASTY Right 02/18/2021   Procedure: TOTAL HIP ARTHROPLASTY ANTERIOR APPROACH;  Surgeon: Kennedy Bucker, MD;  Location: [facility] ORS;  Service: Orthopedics;  Laterality: Right;   Patient Active Problem List   Diagnosis Date Noted   S/P hip replacement 02/18/2021   S/P total hip arthroplasty 02/18/2021   Anemia 10/04/2018   Essential hypertension 10/04/2018   Rheumatoid arthritis with rheumatoid factor (HCC) 10/04/2018   Vitamin D deficiency 10/04/2018   Lung nodule seen on imaging study 08/21/2017   Benign microscopic hematuria 02/06/2017    Cardiac murmur 11/08/2016   Dysphagia 10/11/2016   Encounter for screening colonoscopy 10/11/2016   Rectal bleeding 10/11/2016   Ocular migraine 08/02/2016   Recurrent major depressive disorder, in full remission (HCC) 01/27/2016   Obstructive sleep apnea 09/15/2015    PCP: Lynnea Ferrier, MD   REFERRING PROVIDER: Ignacia Bayley, PA-C  REFERRING DIAG:  M54.50 (ICD-10-CM) - Low back pain, unspecified      Rationale for Evaluation and Treatment: Rehabilitation  THERAPY DIAG:  Other low back pain  Compression fracture of L1 vertebra, initial encounter (HCC)  Muscle weakness (generalized)  ONSET DATE: 03/24/23  SUBJECTIVE:  SUBJECTIVE STATEMENT: Patient reports using heat, ice, and rest initially after injury. Difficulty to stand >5 minutes, prolonged walking, gardening/watering flowers, and household chores. Has an appointment with an interventional radiologist to determine if she is a candidate for kyphoplasty. In standing, patient feels pulling on the R side of her back.  Has not quit completing day to day tasks since injury but has modified tasks (gardening, weed eating, painting, etc.)  PERTINENT HISTORY:  Patient was seen in MD office for continued low back pain since her fall from a ladder back in April. X-ray performed at that time showing severe arthritis but no fracture. Continues to have pain especially with walking. Relieved with sitting. Taking gabapentin. She does have some leg paresthesias, tingling.  CT on 05/17/23 reveals L1 compression and PA suggested potential kyphoplasty with patient wanting to do research prior to decision.   PAIN:  Are you having pain? Yes: NPRS scale: 7/10 Pain location: low back Pain description: pulling, pressure Aggravating factors: prolonged standing,  walking,  Relieving factors: sitting, rest   PRECAUTIONS: Back  WEIGHT BEARING RESTRICTIONS: No  FALLS:  Has patient fallen in last 6 months? Yes. Number of falls 1  LIVING ENVIRONMENT: Lives with: lives alone Lives in: House/apartment Stairs: No Has following equipment at home: Single point cane, Environmental consultant - 2 wheeled, and Ramped entry  OCCUPATION: retired   PLOF: Independent  PATIENT GOALS: pain reduction in the back   NEXT MD VISIT: 05/29/23 - with IR   OBJECTIVE:   DIAGNOSTIC FINDINGS:  CT on 05/17/23: Acute appearing compression fracture of the L1 vertebral body with up to approximately 40% loss of vertebral body height anteriorly and minimal bony retropulsion but no spinal canal stenosis.    PATIENT SURVEYS:  FOTO 46 with goal of 64  SCREENING FOR RED FLAGS: Bowel or bladder incontinence: No Spinal tumors: No Cauda equina syndrome: No Compression fracture: Yes: L1 compression Abdominal aneurysm: No  COGNITION: Overall cognitive status: Within functional limits for tasks assessed     SENSATION: WFL   MUSCLE LENGTH: Hamstrings: WFL    POSTURE: rounded shoulders and forward head  PALPATION: Paraspinal tightness T8-L5  LUMBAR ROM:   AROM eval  Flexion To mid shin  Extension 50%   Right lateral flexion To mid thigh  Left lateral flexion To mid thigh   Right rotation WFL  Left rotation WFL    (Blank rows = not tested)   LOWER EXTREMITY MMT:    MMT Right eval Left eval  Hip flexion 4 4  Hip extension 4 4  Hip abduction 4 4  Hip adduction 4+ 4+  Hip internal rotation    Hip external rotation    Knee flexion 4+ 4+  Knee extension 4+ 4+  Ankle dorsiflexion 4+ 4+  Ankle plantarflexion    Ankle inversion    Ankle eversion     (Blank rows = not tested)  LUMBAR SPECIAL TESTS:  Straight leg raise test: Negative and FABER test: Negative  FUNCTIONAL TESTS:  6 minute walk test: NT  GAIT: Distance walked: 25' Assistive device utilized:  None Level of assistance: Complete Independence Comments: guarded gait, decreased stance time bilaterally, stiff trunk   TODAY'S TREATMENT:  DATE: 06/11/23   Subjective: Patient reports no back pain in sitting position and c/o 2/10 low back pain with standing/walking.  Pt. Reports compliance with HEP.      TE:  Nustep level 4 (seat 11) x 10 minutes for lumbar mobility and pain modulation.  Discussed HEP/ weekend activities.    Supine TrA ex./ added white ball (SLR/ knee to chest/ bridging)- 10x2.  Reviewed 3 packets of HEP and no changes.  PT tightened BTB for home use.    Supine marching 2 x 15 with BTB around knees  Supine bridges 2 x 15 with BTB around knees  Sidelying clamshells with BTB 2 x 15 on each side   Manual:   Seated/prone STM lumbar paraspinals L1-5 x 10 minutes (use of Hypervolt in prone).  L lumbar paraspinals tighter and LLD noted.  L LE (92 cm from ASIS to med. Malleoli) R LE (90 cm from ASIS to med. Malleoli).  Added heel lift and educated pt. On use.  Pt. Instructed to remove lift if pain or limitations.     PATIENT EDUCATION:  Education details: HEP, POC, goals  Person educated: Patient Education method: Explanation, Demonstration, and Handouts Education comprehension: verbalized understanding and returned demonstration  HOME EXERCISE PROGRAM: Access Code: [email or web address] URL: [email or web address] Date: 05/28/2023, 06/04/2023, 06/06/2023 Prepared by: Maylon Peppers  Exercises - Supine Posterior Pelvic Tilt  - 2-3 x daily - 5-7 x weekly - 3 sets - 10 reps - Supine Bridge  - 2-3 x daily - 5-7 x weekly - 3 sets - 10 reps - Bent Knee Fallouts  - 2-3 x daily - 5-7 x weekly - 3 sets - 10 reps - Supine Bridge with Resistance Band  - 2-3 x daily - 5-7 x weekly - 3 sets - 10 reps - Supine March with Resistance Band  - 2-3 x daily - 5-7  x weekly - 3 sets - 10 reps - Clamshell with Resistance  - 2-3 x daily - 5-7 x weekly - 3 sets - 10 reps - Bird Dog  - 2-3 x daily - 5-7 x weekly - 3 sets - 10 reps - Cat Cow  - 2-3 x daily - 5-7 x weekly - 3 sets - 10 reps  ASSESSMENT:  CLINICAL IMPRESSION:    Patient arrives to treatment session motivated with continued reports of mild back pain in standing posture. Session focused on manual STM of lumbar paraspinals and core/hip strengthening with resistance. Able to tolerate exercises well with no increase in back pain.  Discussed LLD and added lift to R shoe to correct hip/trunk posture.  R lumbar paraspinals muscle tightness noted as compared to R.  Patient will continue to benefit from skilled PT interventions to assist in minimizing pain to increase household and outdoor activities.     OBJECTIVE IMPAIRMENTS: Abnormal gait, decreased activity tolerance, decreased balance, decreased endurance, decreased mobility, difficulty walking, decreased strength, increased muscle spasms, impaired flexibility, postural dysfunction, and pain.   ACTIVITY LIMITATIONS: lifting, bending, standing, squatting, and stairs  PARTICIPATION LIMITATIONS: cleaning, laundry, community activity, yard work, and church  PERSONAL FACTORS: Age, Behavior pattern, Past/current experiences, and Time since onset of injury/illness/exacerbation are also affecting patient's functional outcome.   REHAB POTENTIAL: Good  CLINICAL DECISION MAKING: Stable/uncomplicated  EVALUATION COMPLEXITY: Moderate   GOALS: Goals reviewed with patient? Yes  SHORT TERM GOALS: Target date: 06/25/2023  Patient will be independent in HEP to improve strength/mobility for better functional independence with ADLs and yard work Baseline: 7/1: see above  Goal status: INITIAL   LONG TERM GOALS: Target date: 07/23/2023   Patient will increase FOTO score to equal to or greater than 63 to demonstrate statistically significant improvement in  mobility and quality of life.  Baseline: 7/1: 46 Goal status: INITIAL  2.  Patient will increase BLE gross strength to greater than or equal to 4+/5 as to improve functional strength for independent gait, increased standing tolerance and increased ADL ability. Baseline: 7/1: see chart above Goal status: INITIAL  3.  Patient will be able to tolerate >15 minutes of standing while participating in household chores and yard work.  Baseline: 7/1: ~5 minutes before requiring seated rest break  Goal status: INITIAL  PLAN:  PT FREQUENCY: 1-2x/week  PT DURATION: 8 weeks  PLANNED INTERVENTIONS: Therapeutic exercises, Therapeutic activity, Neuromuscular re-education, Balance training, Gait training, Patient/Family education, Self Care, Joint mobilization, Aquatic Therapy, Dry Needling, Spinal mobilization, Cryotherapy, Moist heat, and Manual therapy.  PLAN FOR NEXT SESSION: STM paraspinals, core strengthening    Cammie Mcgee, PT, DPT # [phone] Physical Therapist - [facility]  06/11/2023, 3:39 PM

## 2023-06-13 ENCOUNTER — Telehealth: Payer: Self-pay

## 2023-06-13 NOTE — Telephone Encounter (Signed)
 Advised pt of bx result/sh ?

## 2023-06-13 NOTE — Telephone Encounter (Signed)
-----   Message from Willeen Niece sent at 06/12/2023  6:03 PM EDT ----- Skin , right temple NEUROFIBROMA, IRRITATED  Benign growth of nerve insulation tissue - please call patient

## 2023-06-18 ENCOUNTER — Ambulatory Visit: Payer: Medicare PPO

## 2023-06-18 ENCOUNTER — Ambulatory Visit: Payer: Medicare PPO | Admitting: Physical Therapy

## 2023-06-25 ENCOUNTER — Encounter: Payer: Self-pay | Admitting: Physical Therapy

## 2023-06-25 ENCOUNTER — Ambulatory Visit: Payer: Medicare PPO | Admitting: Physical Therapy

## 2023-06-25 ENCOUNTER — Encounter: Payer: Medicare PPO | Admitting: Dermatology

## 2023-06-25 DIAGNOSIS — M5459 Other low back pain: Secondary | ICD-10-CM | POA: Diagnosis not present

## 2023-06-25 DIAGNOSIS — S32010A Wedge compression fracture of first lumbar vertebra, initial encounter for closed fracture: Secondary | ICD-10-CM

## 2023-06-25 DIAGNOSIS — M6281 Muscle weakness (generalized): Secondary | ICD-10-CM

## 2023-06-25 NOTE — Therapy (Signed)
OUTPATIENT PHYSICAL THERAPY THORACOLUMBAR TREATMENT   Patient Name: Janice Rowe MRN: [id number] DOB:Nov 17, 1956, 67 y.o., female Today's Date: 06/25/2023  END OF SESSION:  PT End of Session - 06/25/23 1338     Visit Number 6    Number of Visits 16    Date for PT Re-Evaluation 07/23/23    Progress Note Due on Visit 10    PT Start Time 1338    PT Stop Time 1430    PT Time Calculation (min) 52 min    Activity Tolerance Patient tolerated treatment well    Behavior During Therapy [facility] for tasks assessed/performed                Past Medical History:  Diagnosis Date   Acute pancreatitis 11/2008   Anemia    Arthritis    GERD (gastroesophageal reflux disease)    Hiatal hernia    Hypertension    Rheumatoid arthritis (HCC)    Sleep apnea    uses c-pap   Vitamin D deficiency    Past Surgical History:  Procedure Laterality Date   CARPAL TUNNEL RELEASE Right 2013   COLONOSCOPY  2007   COLONOSCOPY WITH PROPOFOL N/A 11/29/2016   Procedure: COLONOSCOPY WITH PROPOFOL;  Surgeon: Earline Mayotte, MD;  Location: [facility] ENDOSCOPY;  Service: Endoscopy;  Laterality: N/A;   ESOPHAGOGASTRODUODENOSCOPY (EGD) WITH PROPOFOL N/A 11/29/2016   Procedure: ESOPHAGOGASTRODUODENOSCOPY (EGD) WITH PROPOFOL;  Surgeon: Earline Mayotte, MD;  Location: [facility] ENDOSCOPY;  Service: Endoscopy;  Laterality: N/A;   IR RADIOLOGIST EVAL & MGMT  05/29/2023   TOTAL HIP ARTHROPLASTY Right 02/18/2021   Procedure: TOTAL HIP ARTHROPLASTY ANTERIOR APPROACH;  Surgeon: Kennedy Bucker, MD;  Location: [facility] ORS;  Service: Orthopedics;  Laterality: Right;   Patient Active Problem List   Diagnosis Date Noted   S/P hip replacement 02/18/2021   S/P total hip arthroplasty 02/18/2021   Anemia 10/04/2018   Essential hypertension 10/04/2018   Rheumatoid arthritis with rheumatoid factor (HCC) 10/04/2018   Vitamin D deficiency 10/04/2018   Lung nodule seen on imaging study 08/21/2017   Benign microscopic hematuria 02/06/2017    Cardiac murmur 11/08/2016   Dysphagia 10/11/2016   Encounter for screening colonoscopy 10/11/2016   Rectal bleeding 10/11/2016   Ocular migraine 08/02/2016   Recurrent major depressive disorder, in full remission (HCC) 01/27/2016   Obstructive sleep apnea 09/15/2015    PCP: Lynnea Ferrier, MD   REFERRING PROVIDER: Ignacia Bayley, PA-C  REFERRING DIAG:  M54.50 (ICD-10-CM) - Low back pain, unspecified      Rationale for Evaluation and Treatment: Rehabilitation  THERAPY DIAG:  Other low back pain  Compression fracture of L1 vertebra, initial encounter (HCC)  Muscle weakness (generalized)  ONSET DATE: 03/24/23  SUBJECTIVE:  SUBJECTIVE STATEMENT: Patient reports using heat, ice, and rest initially after injury. Difficulty to stand >5 minutes, prolonged walking, gardening/watering flowers, and household chores. Has an appointment with an interventional radiologist to determine if she is a candidate for kyphoplasty. In standing, patient feels pulling on the R side of her back.  Has not quit completing day to day tasks since injury but has modified tasks (gardening, weed eating, painting, etc.)  PERTINENT HISTORY:  Patient was seen in MD office for continued low back pain since her fall from a ladder back in April. X-ray performed at that time showing severe arthritis but no fracture. Continues to have pain especially with walking. Relieved with sitting. Taking gabapentin. She does have some leg paresthesias, tingling.  CT on 05/17/23 reveals L1 compression and PA suggested potential kyphoplasty with patient wanting to do research prior to decision.   PAIN:  Are you having pain? Yes: NPRS scale: 7/10 Pain location: low back Pain description: pulling, pressure Aggravating factors: prolonged standing,  walking,  Relieving factors: sitting, rest   PRECAUTIONS: Back  WEIGHT BEARING RESTRICTIONS: No  FALLS:  Has patient fallen in last 6 months? Yes. Number of falls 1  LIVING ENVIRONMENT: Lives with: lives alone Lives in: House/apartment Stairs: No Has following equipment at home: Single point cane, Environmental consultant - 2 wheeled, and Ramped entry  OCCUPATION: retired   PLOF: Independent  PATIENT GOALS: pain reduction in the back   NEXT MD VISIT: 05/29/23 - with IR   OBJECTIVE:   DIAGNOSTIC FINDINGS:  CT on 05/17/23: Acute appearing compression fracture of the L1 vertebral body with up to approximately 40% loss of vertebral body height anteriorly and minimal bony retropulsion but no spinal canal stenosis.    PATIENT SURVEYS:  FOTO 46 with goal of 33  SCREENING FOR RED FLAGS: Bowel or bladder incontinence: No Spinal tumors: No Cauda equina syndrome: No Compression fracture: Yes: L1 compression Abdominal aneurysm: No  COGNITION: Overall cognitive status: Within functional limits for tasks assessed     SENSATION: WFL   MUSCLE LENGTH: Hamstrings: WFL    POSTURE: rounded shoulders and forward head  PALPATION: Paraspinal tightness T8-L5  LUMBAR ROM:   AROM eval  Flexion To mid shin  Extension 50%   Right lateral flexion To mid thigh  Left lateral flexion To mid thigh   Right rotation WFL  Left rotation WFL    (Blank rows = not tested)   LOWER EXTREMITY MMT:    MMT Right eval Left eval  Hip flexion 4 4  Hip extension 4 4  Hip abduction 4 4  Hip adduction 4+ 4+  Hip internal rotation    Hip external rotation    Knee flexion 4+ 4+  Knee extension 4+ 4+  Ankle dorsiflexion 4+ 4+  Ankle plantarflexion    Ankle inversion    Ankle eversion     (Blank rows = not tested)  LUMBAR SPECIAL TESTS:  Straight leg raise test: Negative and FABER test: Negative  FUNCTIONAL TESTS:  6 minute walk test: NT  GAIT: Distance walked: 25' Assistive device utilized:  None Level of assistance: Complete Independence Comments: guarded gait, decreased stance time bilaterally, stiff trunk   TODAY'S TREATMENT:  DATE: 06/25/23   Subjective: Patient states she was able to stand at [facility]BS for about 1 hour but reported increase back fatigue.  Pt. Has been busy helping her father out at [facility] nursing home.  Pt. Reports benefit from heel lift and wants a 2nd lift for shoes at home.    TE:  Nustep level 5 (seat 11) x 10 minutes for lumbar mobility and pain modulation.  Discussed HEP/ weekend activities.    Seated blue ball ex.: pelvic tilts/clocks/LAQ/marching/ alt. UE and LE marching with mirror feedback.  Good understanding of TrA muscle activation/ upright posture.   Supine TrA ex./ added white ball (SLR/ knee to chest/ bridging)- 10x2.  No change to HEP   Reviewed HEP  Manual:   Seated/prone STM lumbar paraspinals L1-5 x 10 minutes (use of Hypervolt in prone).  L lumbar paraspinals tighter and LLD noted.  Reassessment of R heel lift in standing posture.     PATIENT EDUCATION:  Education details: HEP, POC, goals  Person educated: Patient Education method: Explanation, Demonstration, and Handouts Education comprehension: verbalized understanding and returned demonstration  HOME EXERCISE PROGRAM: Access Code: [email or web address] URL: [email or web address] Date: 05/28/2023, 06/04/2023, 06/06/2023 Prepared by: Maylon Peppers  Exercises - Supine Posterior Pelvic Tilt  - 2-3 x daily - 5-7 x weekly - 3 sets - 10 reps - Supine Bridge  - 2-3 x daily - 5-7 x weekly - 3 sets - 10 reps - Bent Knee Fallouts  - 2-3 x daily - 5-7 x weekly - 3 sets - 10 reps - Supine Bridge with Resistance Band  - 2-3 x daily - 5-7 x weekly - 3 sets - 10 reps - Supine March with Resistance Band  - 2-3 x daily - 5-7 x weekly - 3 sets - 10 reps - Clamshell  with Resistance  - 2-3 x daily - 5-7 x weekly - 3 sets - 10 reps - Bird Dog  - 2-3 x daily - 5-7 x weekly - 3 sets - 10 reps - Cat Cow  - 2-3 x daily - 5-7 x weekly - 3 sets - 10 reps  ASSESSMENT:  CLINICAL IMPRESSION:    PT session focused on manual STM of lumbar paraspinals and core/hip strengthening with resistance. Able to tolerate exercises well with no increase in back pain.  Pt. Will continue to benefit from use of heel lift to offset LLD.  Pt. Will continue with independent based HEP and will contact PT if any issues or questions.  Probable discharge.     OBJECTIVE IMPAIRMENTS: Abnormal gait, decreased activity tolerance, decreased balance, decreased endurance, decreased mobility, difficulty walking, decreased strength, increased muscle spasms, impaired flexibility, postural dysfunction, and pain.   ACTIVITY LIMITATIONS: lifting, bending, standing, squatting, and stairs  PARTICIPATION LIMITATIONS: cleaning, laundry, community activity, yard work, and church  PERSONAL FACTORS: Age, Behavior pattern, Past/current experiences, and Time since onset of injury/illness/exacerbation are also affecting patient's functional outcome.   REHAB POTENTIAL: Good  CLINICAL DECISION MAKING: Stable/uncomplicated  EVALUATION COMPLEXITY: Moderate   GOALS: Goals reviewed with patient? Yes  SHORT TERM GOALS: Target date: 06/25/2023  Patient will be independent in HEP to improve strength/mobility for better functional independence with ADLs and yard work Baseline: 7/1: see above Goal status: Goal met   LONG TERM GOALS: Target date: 07/23/2023   Patient will increase FOTO score to equal to or greater than 63 to demonstrate statistically significant improvement in mobility and quality of life.  Baseline: 7/1: 46.  7/29: 69 Goal status: Goal  met  2.  Patient will increase BLE gross strength to greater than or equal to 4+/5 as to improve functional strength for independent gait, increased  standing tolerance and increased ADL ability. Baseline: 7/1: see chart above Goal status: Partially met  3.  Patient will be able to tolerate >15 minutes of standing while participating in household chores and yard work.  Baseline: 7/1: ~5 minutes before requiring seated rest break  Goal status:Goal met  PLAN:  PT FREQUENCY: 1-2x/week  PT DURATION: 8 weeks  PLANNED INTERVENTIONS: Therapeutic exercises, Therapeutic activity, Neuromuscular re-education, Balance training, Gait training, Patient/Family education, Self Care, Joint mobilization, Aquatic Therapy, Dry Needling, Spinal mobilization, Cryotherapy, Moist heat, and Manual therapy.  PLAN FOR NEXT SESSION: Probable discharge   Cammie Mcgee, PT, DPT # [id number] Physical Therapist - [facility]  06/25/2023, 6:14 PM

## 2023-07-24 ENCOUNTER — Other Ambulatory Visit: Payer: Self-pay | Admitting: Internal Medicine

## 2023-07-24 DIAGNOSIS — Z1231 Encounter for screening mammogram for malignant neoplasm of breast: Secondary | ICD-10-CM

## 2023-08-16 ENCOUNTER — Ambulatory Visit
Admission: RE | Admit: 2023-08-16 | Discharge: 2023-08-16 | Disposition: A | Discharge status: Home / Self Care | Payer: Medicare PPO | Source: Ambulatory Visit | Attending: Internal Medicine | Admitting: Internal Medicine

## 2023-08-16 DIAGNOSIS — Z1231 Encounter for screening mammogram for malignant neoplasm of breast: Secondary | ICD-10-CM | POA: Insufficient documentation

## 2024-03-25 ENCOUNTER — Other Ambulatory Visit: Payer: Self-pay | Admitting: Internal Medicine

## 2024-03-25 DIAGNOSIS — R251 Tremor, unspecified: Secondary | ICD-10-CM

## 2024-03-25 DIAGNOSIS — I10 Essential (primary) hypertension: Secondary | ICD-10-CM

## 2024-04-04 ENCOUNTER — Ambulatory Visit
Admission: RE | Admit: 2024-04-04 | Discharge: 2024-04-04 | Disposition: A | Discharge status: Home / Self Care | Source: Ambulatory Visit | Attending: Internal Medicine | Admitting: Internal Medicine

## 2024-04-04 DIAGNOSIS — R251 Tremor, unspecified: Secondary | ICD-10-CM | POA: Diagnosis present

## 2024-04-04 DIAGNOSIS — I10 Essential (primary) hypertension: Secondary | ICD-10-CM | POA: Insufficient documentation

## 2024-04-10 ENCOUNTER — Other Ambulatory Visit: Payer: Self-pay

## 2024-04-10 DIAGNOSIS — Z1231 Encounter for screening mammogram for malignant neoplasm of breast: Secondary | ICD-10-CM

## 2024-06-09 ENCOUNTER — Ambulatory Visit (INDEPENDENT_AMBULATORY_CARE_PROVIDER_SITE_OTHER): Payer: Medicare PPO | Admitting: Dermatology

## 2024-06-09 ENCOUNTER — Other Ambulatory Visit: Payer: Self-pay | Admitting: Neurology

## 2024-06-09 DIAGNOSIS — D1801 Hemangioma of skin and subcutaneous tissue: Secondary | ICD-10-CM

## 2024-06-09 DIAGNOSIS — T148XXA Other injury of unspecified body region, initial encounter: Secondary | ICD-10-CM

## 2024-06-09 DIAGNOSIS — D229 Melanocytic nevi, unspecified: Secondary | ICD-10-CM

## 2024-06-09 DIAGNOSIS — L720 Epidermal cyst: Secondary | ICD-10-CM

## 2024-06-09 DIAGNOSIS — L729 Follicular cyst of the skin and subcutaneous tissue, unspecified: Secondary | ICD-10-CM

## 2024-06-09 DIAGNOSIS — W908XXA Exposure to other nonionizing radiation, initial encounter: Secondary | ICD-10-CM | POA: Diagnosis not present

## 2024-06-09 DIAGNOSIS — Z1283 Encounter for screening for malignant neoplasm of skin: Secondary | ICD-10-CM

## 2024-06-09 DIAGNOSIS — L900 Lichen sclerosus et atrophicus: Secondary | ICD-10-CM

## 2024-06-09 DIAGNOSIS — L814 Other melanin hyperpigmentation: Secondary | ICD-10-CM | POA: Diagnosis not present

## 2024-06-09 DIAGNOSIS — L578 Other skin changes due to chronic exposure to nonionizing radiation: Secondary | ICD-10-CM | POA: Diagnosis not present

## 2024-06-09 DIAGNOSIS — D225 Melanocytic nevi of trunk: Secondary | ICD-10-CM

## 2024-06-09 DIAGNOSIS — G20C Parkinsonism, unspecified: Secondary | ICD-10-CM

## 2024-06-09 DIAGNOSIS — L821 Other seborrheic keratosis: Secondary | ICD-10-CM

## 2024-06-09 DIAGNOSIS — L918 Other hypertrophic disorders of the skin: Secondary | ICD-10-CM

## 2024-06-09 DIAGNOSIS — S40811A Abrasion of right upper arm, initial encounter: Secondary | ICD-10-CM

## 2024-06-09 NOTE — Progress Notes (Signed)
 Follow-Up Visit   Subjective  Janice Rowe is a 68 y.o. 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 Not a problem at this time.  Reports recent dx of parkinson's disease.  Her husband Tim died this past year of Parkinson's.  The patient presents for Total-Body Skin Exam (TBSE) for skin cancer screening and mole check. The patient has spots, moles and lesions to be evaluated, some may be new or changing and the patient may have concern these could be cancer.    The following portions of the chart were reviewed this encounter and updated as appropriate: medications, allergies, medical history  Review of Systems:  No other skin or systemic complaints except as noted in HPI or Assessment and Plan.  Objective  Well appearing patient in no apparent distress; mood and affect are within normal limits.  A full examination was performed including scalp, head, eyes, ears, nose, lips, neck, chest, axillae, abdomen, back, buttocks, bilateral upper extremities, bilateral lower extremities, hands, feet, fingers, toes, fingernails, and toenails. All findings within normal limits unless otherwise noted below.   Relevant physical exam findings are noted in the Assessment and Plan.    Assessment & Plan   SKIN CANCER SCREENING PERFORMED TODAY.  ACTINIC DAMAGE - Chronic condition, secondary to cumulative UV/sun exposure - diffuse scaly erythematous macules with underlying dyspigmentation - Recommend daily broad spectrum sunscreen SPF 30+ to sun-exposed areas, reapply every 2 hours as needed.  - Staying in the shade or wearing long sleeves, sun glasses (UVA+UVB protection) and wide brim hats (4-inch brim around the entire circumference of the hat) are also recommended for sun protection.  - Call for new or changing lesions.  LENTIGINES,  SEBORRHEIC KERATOSES, HEMANGIOMAS - Benign normal skin lesions - Lentigo right cheek - 9 mm speckled light tan macule - Benign-appearing - Call for any changes  MELANOCYTIC NEVI - 2 mm medium dark brown macule at right lower back  - Tan-brown and/or pink-flesh-colored symmetric macules and papules - Benign appearing on exam today - Observation - Call clinic for new or changing moles - Recommend daily use of broad spectrum spf 30+ sunscreen to sun-exposed areas.  - Benign-appearing. Stable compared to previous visit. Observation.  Call clinic for new or changing moles.  Recommend daily use of broad spectrum spf 30+ sunscreen to sun-exposed areas.   EPIDERMAL INCLUSION CYST Exam: Subcutaneous nodule at left inframammary  Benign-appearing. Exam most consistent with an epidermal inclusion cyst. Discussed that a cyst is a benign growth that can grow over time and sometimes get irritated or inflamed. Recommend observation if it is not bothersome. Discussed option of surgical excision to remove it if it is growing, symptomatic, or other changes noted. Please call for new or changing lesions so they can be evaluated.   Acrochordons (Skin Tags) Inframammary  - Fleshy, skin-colored pedunculated papules - Benign appearing.  - Observe. - If desired, they can be removed with an in office procedure that is not covered by insurance. - Please call the clinic if you notice any new or changing lesions.  MILIA Exam: tiny erythematous firm white papule At right cheek   Discussed this is a type of cyst. Benign-appearing. Sometimes these will clear with OTC adapalene/Differin 0.1% cream QHS or retinol.  Discussed extraction if symptomatic.   Lichen sclerosus et atrophicus Exam: vaginal area  not examined today, pt states not an issue, no symptoms, not using medication   Chronic condition with duration or expected duration over one year. Currently well-controlled.  Currently no treatment needed at  this time. Patient will use when flared clobetasol  0.05% ointment, increasing to once weekly for maintenance and increase to daily prn symptoms.   Topical steroids (such as triamcinolone, fluocinolone, fluocinonide, mometasone, clobetasol , halobetasol, betamethasone, hydrocortisone) can cause thinning and lightening of the skin if they are used for too long in the same area. Your physician has selected the right strength medicine for your problem and area affected on the body. Please use your medication only as directed by your physician to prevent side effects.     EXCORIATION Exam: Excoriation at right upper arm   Treatment Plan: Recommend vaseline. Call if not resolving.     Return in about 1 year (around 06/09/2025) for TBSE. I, Eleanor Blush, CMA, am acting as scribe for Rexene Rattler, MD.   Documentation: I have reviewed the above documentation for accuracy and completeness, and I agree with the above.  Rexene Rattler, MD

## 2024-06-09 NOTE — Patient Instructions (Addendum)
 Seborrheic Keratosis  What causes seborrheic keratoses? Seborrheic keratoses are harmless, common skin growths that first appear during adult life.  As time goes by, more growths appear.  Some people may develop a large number of them.  Seborrheic keratoses appear on both covered and uncovered body parts.  They are not caused by sunlight.  The tendency to develop seborrheic keratoses can be inherited.  They vary in color from skin-colored to gray, brown, or even black.  They can be either smooth or have a rough, warty surface.   Seborrheic keratoses are superficial and look as if they were stuck on the skin.  Under the microscope this type of keratosis looks like layers upon layers of skin.  That is why at times the top layer may seem to fall off, but the rest of the growth remains and re-grows.    Treatment Seborrheic keratoses do not need to be treated, but can easily be removed in the office.  Seborrheic keratoses often cause symptoms when they rub on clothing or jewelry.  Lesions can be in the way of shaving.  If they become inflamed, they can cause itching, soreness, or burning.  Removal of a seborrheic keratosis can be accomplished by freezing, burning, or surgery. If any spot bleeds, scabs, or grows rapidly, please return to have it checked, as these can be an indication of a skin cancer.   Melanoma ABCDEs  Melanoma is the most dangerous type of skin cancer, and is the leading cause of death from skin disease.  You are more likely to develop melanoma if you: Have light-colored skin, light-colored eyes, or red or blond hair Spend a lot of time in the sun Tan regularly, either outdoors or in a tanning bed Have had blistering sunburns, especially during childhood Have a close family member who has had a melanoma Have atypical moles or large birthmarks  Early detection of melanoma is key since treatment is typically straightforward and cure rates are extremely high if we catch it early.    The first sign of melanoma is often a change in a mole or a new dark spot.  The ABCDE system is a way of remembering the signs of melanoma.  A for asymmetry:  The two halves do not match. B for border:  The edges of the growth are irregular. C for color:  A mixture of colors are present instead of an even brown color. D for diameter:  Melanomas are usually (but not always) greater than 6mm - the size of a pencil eraser. E for evolution:  The spot keeps changing in size, shape, and color.  Please check your skin once per month between visits. You can use a small mirror in front and a large mirror behind you to keep an eye on the back side or your body.   If you see any new or changing lesions before your next follow-up, please call to schedule a visit.  Please continue daily skin protection including broad spectrum sunscreen SPF 30+ to sun-exposed areas, reapplying every 2 hours as needed when you're outdoors.   Staying in the shade or wearing long sleeves, sun glasses (UVA+UVB protection) and wide brim hats (4-inch brim around the entire circumference of the hat) are also recommended for sun protection.    Due to recent changes in healthcare laws, you may see results of your pathology and/or laboratory studies on MyChart before the doctors have had a chance to review them. We understand that in some cases there may  be results that are confusing or concerning to you. Please understand that not all results are received at the same time and often the doctors may need to interpret multiple results in order to provide you with the best plan of care or course of treatment. Therefore, we ask that you please give [place] 2 business days to thoroughly review all your results before contacting the office for clarification. Should we see a critical lab result, you will be contacted sooner.   If You Need Anything After Your Visit  If you have any questions or concerns for your doctor, please call our main  line at [phone] and press option 4 to reach your doctor's medical assistant. If no one answers, please leave a voicemail as directed and we will return your call as soon as possible. Messages left after 4 pm will be answered the following business day.   You may also send [place] a message via MyChart. We typically respond to MyChart messages within 1-2 business days.  For prescription refills, please ask your pharmacy to contact our office. Our fax number is [phone].  If you have an urgent issue when the clinic is closed that cannot wait until the next business day, you can page your doctor at the number below.    Please note that while we do our best to be available for urgent issues outside of office hours, we are not available 24/7.   If you have an urgent issue and are unable to reach [place], you may choose to seek medical care at your doctor's office, retail clinic, urgent care center, or emergency room.  If you have a medical emergency, please immediately call 911 or go to the emergency department.  Pager Numbers  - Dr. Gwen Pounds: [phone]  - Dr. Roseanne Reno: [phone]  - Dr. Katrinka Blazing: [phone]   In the event of inclement weather, please call our main line at [phone] for an update on the status of any delays or closures.  Dermatology Medication Tips: Please keep the boxes that topical medications come in in order to help keep track of the instructions about where and how to use these. Pharmacies typically print the medication instructions only on the boxes and not directly on the medication tubes.   If your medication is too expensive, please contact our office at [phone] option 4 or send [place] a message through MyChart.   We are unable to tell what your co-pay for medications will be in advance as this is different depending on your insurance coverage. However, we may be able to find a substitute medication at lower cost or fill out paperwork to get insurance to cover  a needed medication.   If a prior authorization is required to get your medication covered by your insurance company, please allow [place] 1-2 business days to complete this process.  Drug prices often vary depending on where the prescription is filled and some pharmacies may offer cheaper prices.  The website www.goodrx.com contains coupons for medications through different pharmacies. The prices here do not account for what the cost may be with help from insurance (it may be cheaper with your insurance), but the website can give you the price if you did not use any insurance.  - You can print the associated coupon and take it with your prescription to the pharmacy.  - You may also stop by our office during regular business hours and pick up a GoodRx coupon card.  - If you need your prescription sent electronically to  a different pharmacy, notify our office through [facility] or by phone at [phone] option 4.     Si Usted Necesita Algo Despus de Su Visita  Tambin puede enviarnos un mensaje a travs de Clinical cytogeneticist. Por lo general respondemos a los mensajes de MyChart en el transcurso de 1 a 2 das hbiles.  Para renovar recetas, por favor pida a su farmacia que se ponga en contacto con nuestra oficina. Annie Sable de fax es [place] [phone].  Si tiene un asunto urgente cuando la clnica est cerrada y que no puede esperar hasta el siguiente da hbil, puede llamar/localizar a su doctor(a) al nmero que aparece a continuacin.   Por favor, tenga en cuenta que aunque hacemos todo lo posible para estar disponibles para asuntos urgentes fuera del horario de [place], no estamos disponibles las 24 horas del da, los 7 [address]  [address] de la [place].   Si tiene un problema urgente y no puede comunicarse con nosotros, puede optar por buscar atencin mdica  en el consultorio de su doctor(a), en una clnica privada, en un centro de atencin urgente o en una sala de emergencias.  Si tiene Psychologist, clinical, por favor llame inmediatamente al 911 o vaya a la sala de emergencias.  Nmeros de bper  - Dr. Gwen Pounds: [phone]  - Dra. Roseanne Reno: [phone]  - Dr. Katrinka Blazing: [phone]   En caso de inclemencias del tiempo, por favor llame a Lacy Duverney principal al [phone] para una actualizacin sobre el [place] de cualquier retraso o cierre.  Consejos para la medicacin en dermatologa: Por favor, guarde las cajas en las que vienen los medicamentos de uso tpico para ayudarle a seguir las instrucciones sobre dnde y cmo usarlos. Las farmacias generalmente imprimen las instrucciones del medicamento slo en las cajas y no directamente en los tubos del [place].   Si su medicamento es muy caro, por favor, pngase en contacto con Rolm Gala llamando al [phone] y presione la opcin 4 o envenos un mensaje a travs de Clinical cytogeneticist.   No podemos decirle cul ser su copago por los medicamentos por adelantado ya que esto es diferente dependiendo de la cobertura de su seguro. Sin embargo, es posible que podamos encontrar un medicamento sustituto a Audiological scientist un formulario para que el seguro cubra el medicamento que se considera necesario.   Si se requiere una autorizacin previa para que su compaa de seguros [place] su medicamento, por favor permtanos de 1 a 2 das hbiles para completar [address]  Los precios de los medicamentos varan con frecuencia dependiendo del Environmental consultant de dnde se surte la receta y alguna farmacias pueden ofrecer precios ms baratos.  El sitio web www.goodrx.com tiene cupones para medicamentos de Health and safety inspector. Los precios aqu no tienen en cuenta lo que podra costar con la ayuda del seguro (puede ser ms barato con su seguro), pero el sitio web puede darle el precio si no utiliz Tourist information centre manager.  - Puede imprimir el cupn correspondiente y llevarlo con su receta a la farmacia.  - Tambin puede pasar por nuestra oficina durante el horario de  atencin regular y Education officer, museum una tarjeta de cupones de GoodRx.  - Si necesita que su receta se enve electrnicamente a una farmacia diferente, informe a nuestra oficina a travs de MyChart de [place] o por telfono llamando al [phone] y presione la opcin 4.

## 2024-06-11 ENCOUNTER — Ambulatory Visit
Admission: RE | Admit: 2024-06-11 | Discharge: 2024-06-11 | Disposition: A | Discharge status: Home / Self Care | Source: Ambulatory Visit | Attending: Neurology | Admitting: Neurology

## 2024-06-11 DIAGNOSIS — G20C Parkinsonism, unspecified: Secondary | ICD-10-CM | POA: Insufficient documentation

## 2024-08-05 NOTE — Progress Notes (Signed)
 Ref Provider: Fernande Ophelia Marinell DOUGLAS, MD PCP: Fernande Ophelia Marinell DOUGLAS, MD Assessment and Plan:   In most patients we give written parts of assessment and plan to patient under Patient Instructions/After Visit Summary. So some parts are directed to patient.  Dear Ms. Janice Rowe, It was our pleasure to participate in your care in person. We have typed up brief summary of what we discussed. Assessment & Plan Parkinsonism symptoms - Tremors, gait changes, stiffness Parkinson's disease with symptoms including tremors, bradykinesia, rigidity, and postural instability. Symptoms have been worsening, particularly in the right hand, with additional tremors in the lips and legs at rest. There is also a decrease in fine motor skills, balance issues, and handwriting changes. The differential diagnosis includes essential tremor, but the clinical presentation is more consistent with Parkinson's disease. Patient reports good sense of smell. No active dreams. Constipation, patient takes prune juice. Handwriting getting smaller. Patient reports no problems with loud/soft voice. Difficulties with walking. Taking gabapentin , however slowly weaning off. Carbidopa-levodopa has improved tremors overall.  - Continue Carbidopa-levodopa 25-100 mg three times a day    - Order physical therapy and occupational therapy at [facility], with preference for Marina  as physical therapist and Inocente as occupational therapist. - Encourage use of weighted gloves and pens for tremor management. - Encourage continuation of physical activity and stretching exercises. - Discuss dietary modifications including the MIND diet to slow disease progression.  As previously mentioned, some life style modifications can help prevent and delay progression of dementia: maintaining a whole food plant based diet, being physically active, getting 7-8 hours of sleep a night, managing stress, engaging in to challenging mental activities, and being  socially active. Learn more at https://www.morris-vasquez.com/, LimitLaws.hu and www.mybraindoctor.com  As previously mentioned, we recommend a diet consisting of a variety of green leafy vegetables, fruits, nuts and other whole grain food. Avoid ultra-processed food, soda and artifical sweeteners.   We reviewed MRI brain with Janice Rowe done on 06/11/2024 06/11/2024 MRI brain Wo contrast IMPRESSION:  1. Normal MRI of the brain for age. No evidence of accelerated or  lobar specific volume loss. No evidence of clinically significant  small-vessel ischemic change.  2. NeuroQuant volumetric analysis of the brain, see details on  [employer].   Labs Reviewed  Vit B12, Vit D  Rheumatoid arthritis Rheumatoid arthritis managed with methotrexate and Humira.  - Continue methotrexate and Humira as prescribed.  Essential hypertension Hypertension managed with metoprolol .  - Continue metoprolol  as prescribed.  Problems with falling asleep - taking Ambien  and Melatonin   Sleep Hygiene: Monitoring sleep pattern with wearable can help you decide if you have insomnia vs sleep state misinterpretation disorder. Learn your chrono type (morning type, evening type, or neutral) and try to sync your sleep habits with it. Get at least 20-30 minutes of sunlight (without dark glasses) exposure during awake time. Avoid caffeine 8-10 hours before going to bed. Alcohol can put one to sleep but it disrupts sleep architecture and should be avoided as a sedative. One should maintain regular bed  time and wake up time, avoid using screen - worrying - watching TV etc in bed, avoid clock watching, avoid large meal, exercise close to bed time, bright lights. One can lower bedroom temperature, use white noise, etc.   For more information visit: GamingBus.hu Or Search Adina Finder TED talk - Sleeping with Science - watch the series on  YouTube [email or web address].ca  Consider downloading an application from [place] about CBT I. It is cognitive  behavioral therapy for insomnia. It has many practical, customized suggestions for patients. It is a free software.  MapSeats.co.uk  [place] also has an Academic librarian on insomnia called SleepEZ. https://www.veterantraining.Gigun.com.au.aspiration  Local therapist is cheryl Gerlean in [place]. Cheryl E. Gerlean, MA, NCC, [facility], CCTP  [address] [place], [place], [place] [address]  [phone]  5. Constipation Chronic constipation managed with prune juice and Colace. Bowel movements occur approximately every three days.  - Continue current regimen of prune juice and Colace.  6. Urinary incontinence Urinary incontinence with increased urgency, but no nocturnal symptoms reported.   Follow-Up appointment in four months with Allyson Stallion, FNP-BC    Labs reviewed Normal labs, no further action is necessary.   Follow-up in 2-3 months with Allyson Stallion, FNP-BC  No follow-ups on file.  Interim History date 08/05/2024   Janice Rowe is a 68 y.o. female here for treatment and evaluation of tremors.  Janice Rowe last visit was on 06/05/2024.  Janice Rowe is a 68 year old female with Parkinsonism who presents for follow-up of her symptoms.  She experiences symptoms of Parkinsonism, including tremors, bradykinesia, rigidity, and postural instability. Carbidopa-levodopa has improved her tremors, although they worsen in the evening. 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 She takes carbidopa-levodopa three times a day at 7 AM, 12 PM, and 5 PM, but experiences her worst tremors around 1 or 2 PM, which she attributes to stress and fatigue. She remains active, engaging in activities like walking and maintaining her property, including tasks  like weed-eating and power washing.  Her past medical history includes rheumatoid arthritis and vertebral fractures. She is concerned about performing the right exercises due to her RA and has requested an assessment from physical and occupational therapy to ensure she is doing appropriate exercises at home.  She is currently taking Ambien  and melatonin for sleep, reporting improvement in her sleep quality. She also experiences urinary urgency, intermittent urinary incontinence, and constipation. No nocturnal urinary incontinence is reported.  Her current medications include carbidopa-levodopa, gabapentin  (recently reduced from four to three tablets daily), Colace as needed, Flonase  as needed, and Zyrtec for allergies. She has discontinued Singulair and is using clobetasol  as necessary.  Family history reveals her husband had Parkinson's disease. 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 She walks daily and maintains a large property.  History and Present Illness:   Janice Rowe is a right handed 68 y.o. female here for evaluation of No chief complaint on file. , referred by Fernande Ophelia Marinell DOUGLAS, MD.  Patient is presenting for tremors that have been ongoing for years, but have been worsening in her right hand since around 08/2024. She has difficulty with fine motor tasks and notes tremors in her lips and legs at rest. She also notes weakness in her right arm. Patient also reports worsening balance. She does taking Gabapentin  but it is not controlling the tremors well per patient. She denies any side effects.   Patient had labs drawn 03/13/2024 (Vit D, Iron, Ferritin, TSH, Microalbumin, Lipid, CBC, CMP)  History of Present Illness Janice Rowe is a 68 year old female with rheumatoid arthritis who presents with worsening tremors. She was referred by Dr. Fernande for evaluation of her tremors.  She has experienced tremors for several years, with a  notable worsening in her right hand since October 2025. The tremors affect  her ability to perform fine motor tasks and are present in her lips and legs at rest. She also feels weakness in her right arm and reports a decline in balance. Stress and fatigue exacerbate the tremors. Gabapentin  is currently used for management, but it is not effectively controlling the symptoms, and she denies any side effects from the medication.  Her medical history includes rheumatoid arthritis, managed with methotrexate and Humira, and carpal tunnel syndrome, for which she has undergone release surgery. She takes metoprolol  for hypertension and venlafaxine  for mental health. She also uses turmeric as a supplement.  She reports changes in handwriting, with fluctuations from larger to smaller script, and more frequent word recall issues, though she denies memory or cognitive problems. No changes in sleep patterns, acting out dreams, visual disturbances, hallucinations, dizziness, or vertigo. She experiences urinary incontinence with increased urgency but no nocturnal issues.  She has constipation, typically having a bowel movement every three days, managed with prune juice and Colace. No loss of smell or acting out dreams at night. She has difficulty falling asleep and uses Ambien  and melatonin to aid sleep. She maintains a routine of chair yoga and floor exercises to manage bursitis.  I reviewed labs, imaging, and notes in [place], [place], and from outside providers, if available.   Results RADIOLOGY Head CT: Normal   LABS Vitamin D : within normal limits Vitamin B12: within normal limits  RADIOLOGY Brain MRI: no abnormalities  General Exam:   There were no vitals filed for this visit.   There is no height or weight on file to calculate BMI.  Physical Exam NEUROLOGICAL: Reduced arm swing during gait. Finger taps decreased on right compared to left. Foot taps decreased on right compared to left.  Tremor in right wrist. Cogwheeling with contralateral activation. Pull test performed, balance maintained. Bradykinesia and decreased foot tap and arm swing during gait.     General exam 06/05/24 Hearing aids present   Neuro exam 06/05/24 Reduced arm swing during gait.  Finger taps decreased on right compared to left.  Foot taps decreased on right compared to left.  Tremor in right wrist.  Cogwheeling with contralateral activation.  Pull test performed, balance maintained.  Bradykinesia and decreased foot tap and arm swing during gait.    Social Drivers of Health   Tobacco Use: Medium Risk (07/09/2024)   Patient History   . Smoking Tobacco Use: Former   . Smokeless Tobacco Use: Never   . Passive Exposure: Past  Alcohol Use: Not At Risk (04/10/2024)   AUDIT-C   . Frequency of Alcohol Consumption: Never   . Average Number of Drinks: Patient does not drink   . Frequency of Binge Drinking: Never  Financial Resource Strain: Low Risk  (06/05/2024)   Overall Financial Resource Strain (CARDIA)   . Difficulty of Paying Living Expenses: Not hard at all  Food Insecurity: No Food Insecurity (06/05/2024)   Hunger Vital Sign   . Worried About Programme researcher, broadcasting/film/video in the Last Year: Never true   . Ran Out of Food in the Last Year: Never true  Transportation Needs: No Transportation Needs (06/05/2024)   PRAPARE - Transportation   . Lack of Transportation (Medical): No   . Lack of Transportation (Non-Medical): No  Physical Activity: Not on file  Stress: Not on file  Social Connections: Not on file  Depression: Mild depression (04/10/2024)   PHQ-9   . PHQ-9 Score: 5  Housing Stability: Low Risk  (07/02/2024)   Housing Stability  Vital Sign   . Unable to Pay for Housing in the Last Year: No   . Number of Times Moved in the Last Year: 0   . Homeless in the Last Year: No  Utilities: Not At Risk (06/05/2024)   [facility] Utilities   . Threatened with loss of utilities: No  Health Literacy: Not on  file    Medications: Current Outpatient Medications on File Prior to Visit  Medication Sig Dispense Refill  . acetaminophen  (TYLENOL ) 500 MG tablet Take 500 mg by mouth as needed for Pain Takes this at night.   (Patient not taking: Reported on 06/05/2024)    . adalimumab (HUMIRA,CF,) 40 mg/0.4 mL prefilled syringe kit Inject 0.4 mLs (40 mg total) subcutaneously every 14 (fourteen) days 6 each 3  . calcium carbonate-vitamin D3 (OS-CAL 500+D) 500 mg-10 mcg (400 unit) tablet Take 1 tablet by mouth 2 (two) times daily with meals    . carbidopa-levodopa (SINEMET) 25-100 mg tablet Take 1 tablet by mouth 3 (three) times daily 90 tablet 3  . cetirizine (ZYRTEC) 10 MG tablet TAKE ONE TABLET BY MOUTH ONCE DAILY 90 tablet 3  . cholecalciferol , vitamin D3, 100 mcg (4,000 unit) Cap Take by mouth once daily.    . clobetasoL  (TEMOVATE ) 0.05 % ointment To affected area 1-2 times a week as needed (Patient not taking: Reported on 06/05/2024) 60 g 1  . docusate (COLACE) 100 MG capsule Take 100 mg by mouth once daily (Patient not taking: Reported on 06/05/2024)    . fluticasone  propionate (FLONASE ) 50 mcg/actuation nasal spray Place 2 sprays into both nostrils once daily as needed (Patient not taking: Reported on 06/05/2024) 16 g 4  . folic acid  (FOLVITE ) 1 MG tablet Take 1 tablet (1,000 mcg total) by mouth once daily 90 tablet 3  . gabapentin  (NEURONTIN ) 100 MG capsule Take 1 capsule (100 mg total) by mouth 4 (four) times daily 120 capsule 11  . melatonin 3 mg tablet Take 5 mg by mouth Takes one at bedtime as needed (Patient not taking: Reported on 06/05/2024)    . methotrexate (RHEUMATREX) 2.5 MG tablet Take 5 tablets (12.5 mg total) by mouth every 7 (seven) days 60 tablet 1  . metoprolol  SUCCinate (TOPROL -XL) 50 MG XL tablet TAKE ONE TABLET BY MOUTH EVERY DAY 90 tablet 1  . montelukast (SINGULAIR) 10 mg tablet TAKE 1 TABLET ONCE DAILY FOR 14 DAYS (Patient not taking: Reported on 06/05/2024) 14 tablet 0  . naproxen  (NAPROSYN) 500 MG tablet Take 1 tablet (500 mg total) by mouth 2 (two) times daily with meals BREAKFAST AND SUPPER 60 tablet 5  . omega-3 fatty acids/fish oil 340-1,000 mg capsule Take 3 capsules by mouth once daily    . omeprazole (PRILOSEC) 20 MG DR capsule TAKE (1) CAPSULE BY MOUTH DAILY BEFORE BREAKFAST. 90 capsule 3  . TURMERIC, BULK, MISC Use.    . venlafaxine  (EFFEXOR -XR) 75 MG XR capsule take one capsule by mouth once daily 30 capsule 11  . VITAMIN B COMPLEX ORAL Take by mouth once daily.    . zolpidem  (AMBIEN ) 5 MG tablet TAKE ONE TABLET BY MOUTH AT BEDTIME AS NEEDED FOR SLEEP 30 tablet 5   No current facility-administered medications on file prior to visit.     Past Medical History:  Past Medical History:  Diagnosis Date  . Abdominal pain 12/2006   CT of the abdomen and pelvis revealing constipation without other sources.  She was noted to have small pericardial effusion, thought to be  secondary to rheumatoid arthritis.  Had echocardiogram performed revealing preserved LV function with small effusion, no evidence of tamponade.   . Abnormal mammogram    followed by Dr. Dessa  . Acute pancreatitis (HHS-HCC) 11/2008   hospitalized, possible transient gallstone.  Evaluated by GI (Dr. Jinny) with spontaneous resolution.  . Anemia    Iron, folate, ferritin levels, UPEP/SPEP normal 2014; B12 deficiency noted.  . Arthritis   . Chickenpox   . Hematuria   . Hypertension   . Hypoglycemia   . Obstructive sleep apnea 09/15/2015   Sleep study 10/16 with AHI 40.8; autocpap arranged  . Ocular migraine 08/02/2016   Transient vision changes 3/17.  MRI, carotid Dopplers unremarkable.  Followed by ophthalmology optometry, and felt to be most consistent with ocular migraine  . Rheumatoid arthritis with rheumatoid factor (CMS/HHS-HCC)    a.Plaquenil. b.Methotrexate. c.Seropositive, erosive. d.Enbrel.  . Vitamin D  deficiency     Past Surgical History:  Past Surgical History:  Procedure  Laterality Date  . COLONOSCOPY  2018   rpt 10 years  . OTHER SURGERY Right 02/18/2021   hip replacement  . Cyst excisions    . ENDOSCOPIC CARPAL TUNNEL RELEASE Right    Family History:  Family History  Problem Relation Name Age of Onset  . High blood pressure (Hypertension) Mother    . Breast cancer Mother    . High blood pressure (Hypertension) Father     Social History:  Social History   Socioeconomic History  . Marital status: Married  Tobacco Use  . Smoking status: Former    Passive exposure: Past  . Smokeless tobacco: Never  Vaping Use  . Vaping status: Never Used  Substance and Sexual Activity  . Alcohol use: No    Alcohol/week: 0.0 standard drinks of alcohol  . Drug use: No  . Sexual activity: Not Currently    Partners: Male    Birth control/protection: Post-menopausal   Social Drivers of Health   Financial Resource Strain: Low Risk  (06/05/2024)   Overall Financial Resource Strain (CARDIA)   . Difficulty of Paying Living Expenses: Not hard at all  Food Insecurity: No Food Insecurity (06/05/2024)   Hunger Vital Sign   . Worried About Programme researcher, broadcasting/film/video in the Last Year: Never true   . Ran Out of Food in the Last Year: Never true  Transportation Needs: No Transportation Needs (06/05/2024)   PRAPARE - Transportation   . Lack of Transportation (Medical): No   . Lack of Transportation (Non-Medical): No  Housing Stability: Low Risk  (07/02/2024)   Housing Stability Vital Sign   . Unable to Pay for Housing in the Last Year: No   . Number of Times Moved in the Last Year: 0   . Homeless in the Last Year: No   Allergies:  Allergies  Allergen Reactions  . Polyethylene Glycol 300 Diarrhea and Nausea And Vomiting  . Rocephin [Ceftriaxone] Nausea and Rash   This note has been created using automated tools and reviewed for accuracy by ELIAS GREGORY RODRIGUEZ  Attestation Statement:   I personally performed the service, non-incident to. (WP)   ELIAS CORDELLA STALLION, NP

## 2024-08-13 ENCOUNTER — Ambulatory Visit: Admitting: Occupational Therapy

## 2024-08-13 ENCOUNTER — Encounter: Payer: Self-pay | Admitting: Physical Therapy

## 2024-08-13 ENCOUNTER — Ambulatory Visit: Attending: Neurology | Admitting: Physical Therapy

## 2024-08-13 DIAGNOSIS — R278 Other lack of coordination: Secondary | ICD-10-CM

## 2024-08-13 DIAGNOSIS — R262 Difficulty in walking, not elsewhere classified: Secondary | ICD-10-CM | POA: Diagnosis present

## 2024-08-13 DIAGNOSIS — R251 Tremor, unspecified: Secondary | ICD-10-CM | POA: Diagnosis present

## 2024-08-13 DIAGNOSIS — M6281 Muscle weakness (generalized): Secondary | ICD-10-CM | POA: Diagnosis present

## 2024-08-13 DIAGNOSIS — R2681 Unsteadiness on feet: Secondary | ICD-10-CM | POA: Insufficient documentation

## 2024-08-13 DIAGNOSIS — R2689 Other abnormalities of gait and mobility: Secondary | ICD-10-CM | POA: Insufficient documentation

## 2024-08-13 NOTE — Therapy (Signed)
 OUTPATIENT OCCUPATIONAL THERAPY NEURO EVALUATION  Patient Name: Janice Rowe MRN: [id number] DOB:1956/11/13, 68 y.o., female Today's Date: 08/14/2024  PCP: Fernande Ophelia PARAS. III, MD REFERRING PROVIDER: Maree Hila, MARLA, MD  END OF SESSION:  OT End of Session - 08/13/24 2315     Visit Number 1    Number of Visits 24    Date for Recertification  11/05/24    OT Start Time 1530    OT Stop Time 1615    OT Time Calculation (min) 45 min    Activity Tolerance Patient tolerated treatment well    Behavior During Therapy [facility] for tasks assessed/performed          Past Medical History:  Diagnosis Date   Acute pancreatitis 11/2008   Anemia    Arthritis    GERD (gastroesophageal reflux disease)    Hiatal hernia    Hypertension    Rheumatoid arthritis (HCC)    Sleep apnea    uses c-pap   Vitamin D  deficiency    Past Surgical History:  Procedure Laterality Date   CARPAL TUNNEL RELEASE Right 2013   COLONOSCOPY  2007   COLONOSCOPY WITH PROPOFOL  N/A 11/29/2016   Procedure: COLONOSCOPY WITH PROPOFOL ;  Surgeon: Reyes LELON Cota, MD;  Location: [facility] ENDOSCOPY;  Service: Endoscopy;  Laterality: N/A;   ESOPHAGOGASTRODUODENOSCOPY (EGD) WITH PROPOFOL  N/A 11/29/2016   Procedure: ESOPHAGOGASTRODUODENOSCOPY (EGD) WITH PROPOFOL ;  Surgeon: Reyes LELON Cota, MD;  Location: [facility] ENDOSCOPY;  Service: Endoscopy;  Laterality: N/A;   IR RADIOLOGIST EVAL & MGMT  05/29/2023   TOTAL HIP ARTHROPLASTY Right 02/18/2021   Procedure: TOTAL HIP ARTHROPLASTY ANTERIOR APPROACH;  Surgeon: Kathlynn Sharper, MD;  Location: [facility] ORS;  Service: Orthopedics;  Laterality: Right;   Patient Active Problem List   Diagnosis Date Noted   S/P hip replacement 02/18/2021   S/P total hip arthroplasty 02/18/2021   Anemia 10/04/2018   Essential hypertension 10/04/2018   Rheumatoid arthritis with rheumatoid factor (HCC) 10/04/2018   Vitamin D  deficiency 10/04/2018   Lung nodule seen on imaging study 08/21/2017   Benign microscopic  hematuria 02/06/2017   Cardiac murmur 11/08/2016   Dysphagia 10/11/2016   Encounter for screening colonoscopy 10/11/2016   Rectal bleeding 10/11/2016   Ocular migraine 08/02/2016   Recurrent major depressive disorder, in full remission (HCC) 01/27/2016   Obstructive sleep apnea 09/15/2015    ONSET DATE: 2024  REFERRING DIAG:  Parkinson's Disease  THERAPY DIAG:  Muscle weakness (generalized)  Other lack of coordination  Rationale for Evaluation and Treatment: Rehabilitation  SUBJECTIVE:   SUBJECTIVE STATEMENT: Pt. Reports that  she wants to stay ahead of the Parkinson's, and reports that she has started [employer] Pt accompanied by: self  PERTINENT HISTORY: Pt. is a 68 y.o. female who was is currently being followed by Dr. Maree in Neurology for Parkinson's Disease. PMHx includes: Tremors, RA, and Vertebral Fractures.  PRECAUTIONS: None  WEIGHT BEARING RESTRICTIONS: No  PAIN:  Are you having pain? 2/10 pain in back at initial eval, however typicially 4/10  FALLS: Has patient fallen in last 6 months? No  LIVING ENVIRONMENT: Lives with: Alone; son  lives next door Lives in: House/apartment Stairs: Ramped entrance Has following equipment at home: Single point cane, Environmental consultant - 2 wheeled, Wheelchair (manual), Shower bench, bed side commode, and Grab bars  PLOF: Independent  PATIENT GOALS: To improve FMC   OBJECTIVE:  Note: Objective measures were completed at Evaluation unless otherwise noted.  HAND DOMINANCE: Right  ADLs: Overall ADLs:  Transfers/ambulation  related to ADLs: Eating:  Independent, difficulty cutting steak Grooming:  Independent UB Dressing: Difficulty with fastening a bra. Fastens in front and is hard to turn it to the back, Difficulty buttoning LB Dressing: Independent with slide on shoes, elastic pants.  Toileting:  difficulty using right hand for posterior toilet hygiene care-uses modified technique. Bathing:  Weakness with UE's noted  when washing UEs, shaving legs is difficulty Tub Shower transfers: Modified independent   IADLs: Shopping: Independent Light housekeeping: Mopping is difficult  2/2 back pain Meal Prep: Independent heating items up, cooking eggs. Community mobility: Independent- increased awareness;  has slowed down Medication management: Unable to grasp pills from the tabletop surface. Is able to from her hand. Pillbox-2 weeks at a time Financial management: Transitioning to online accounts Handwriting: 50% legible, micrographia-smaller towards end of her name, has jaw tremor when writing.            Other: Pt. 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   MOBILITY STATUS: Independent  POSTURE COMMENTS:  No Significant postural limitations   ACTIVITY TOLERANCE: Activity tolerance: fatigues after 20 min. of activity  FUNCTIONAL OUTCOME MEASURES: MAM-20: 56/80  UPPER EXTREMITY ROM:    Active ROM Right Eval WFL Left Eval [facility]  Shoulder flexion    Shoulder abduction    Shoulder adduction    Shoulder extension    Shoulder internal rotation    Shoulder external rotation    Elbow flexion    Elbow extension    Wrist flexion    Wrist extension    Wrist ulnar deviation    Wrist radial deviation    Wrist pronation    Wrist supination    (Blank rows = not tested)  UPPER EXTREMITY MMT:     MMT Right eval Left Eval 5/5  Shoulder flexion 4/5   Shoulder abduction 4/5   Shoulder adduction    Shoulder extension    Shoulder internal rotation    Shoulder external rotation    Middle trapezius    Lower trapezius    Elbow flexion 5/5   Elbow extension 5/5   Wrist flexion 5/5   Wrist extension 5/5   Wrist ulnar deviation    Wrist radial deviation    Wrist pronation 5/5   Wrist supination 5/5   (Blank rows = not tested)  HAND FUNCTION: Grip strength: Right: 35 lbs; Left: 47 lbs, Lateral pinch:  Right: 13 lbs, Left: 12 lbs, and 3 point pinch: Right: 6 lbs, Left: 10 lbs  COORDINATION: 9 Hole Peg test: Right: 40 sec; Left: 24 sec  SENSATION: Light touch: WFL Proprioception: WFL  EDEMA:  N/A  MUSCLE TONE: Intact  COGNITION: Overall cognitive status: Within functional limits for tasks assessed  **Pt. Reports changes with word finding. Is interested in an ST referral following OT/PT**  VISION: Subjective report: Wears glasses. Upgraded prescription 3-4 months ago    PERCEPTION: WFL  PRAXIS: [facility]  TREATMENT DATE: 08/13/24   OT initial evaluation was completed, and Pt. education was provided as indicated below.   PATIENT EDUCATION: Education details: OT services, POC, goals and ADL/IADL functional Status.  Person educated: Patient Education method: Medical illustrator Education comprehension: verbalized understanding, returned demonstration, verbal cues required, and tactile cues required  HOME EXERCISE PROGRAM:  Continue to assess ongoing need for HEPs, and provide/upgrade as indicated.    GOALS: Goals reviewed with patient? Yes  SHORT TERM GOALS: Target date: 09/24/2024  Pt. Will be independent with HEPs for the RUE, and hand strength, and coordination Baseline: Eval: No current HEP Goal status: INITIAL  LONG TERM GOALS: Target date: 11/05/2024  Pt. Will improve right shoulder strength by 2 mm grades to assist with ADLs, and IADLs. Baseline: Eval: Right shoulder flexion: 4/5, Abduction: 4/5. Goal status: INITIAL  2.  Pt. Will improve right grip strength by 5# to be able to securely hold, and pass weighted cups of soup. Baseline: Eval: Right: 35#, Left: 47# Goal status: INITIAL  3.  Pt. Will improve right hand pinch strength by 3# to be able to cut meat more efficiently Baseline: Eval: Lateral pinch: Right: 13 lbs, Left: 12 lbs,  and 3 point pinch: Right: 6 lbs, Left: 10 lbs Goal status: INITIAL  4.  Pt. Will improve right hand [facility] skills in order to be able to grasp small items from a flat tabletop surface. Baseline: Eval: Right: 40 sec., Left: 24 sec. Goal status: INITIAL  5.  Pt. Will identify/demonstrate 3 compensatory strategies for tremors during ADL/IADLs, and when holding a hymn book in choir. Baseline: Eval: Education to be provided . Pt. Has difficulty stabilizing a hymn book in choir. Goal status: INITIAL  6.  Pt. Will write one  sentence with 100% legibility with consistent letter size throughout. Baseline: Eval: Name only: 50%,with micrographia 1/2 way through. Goal status: INITIAL  ASSESSMENT:  CLINICAL IMPRESSION:  Patient is a 68 y.o. female who was seen today for occupational therapy evaluation for Parkinson's Disease. Pt. presents with tremors, weakness in the right shoulder,  decreased right grip strength, decreased 3pt. pinch strength, and decreased dominant right hand [facility] skills. These impairments limit her ability to perform daily ADL/IADL tasks including: fastening a bra efficiently, writing legibly, securely holding and passing weighted cups of stew and at fundraisers, holding hymn books steady while singing in the choir, cutting meat, and picking up small objects off of a flat tabletop surface. MAM-20 sum score: 56/80. Pt. will benefit from OT services to work on improving her dominant RUE functioning in order to maximize overall independence with ADLs, and ADL tasks.  Pt.will also benefit from education about work simplification and compensatory strategies for tremors during ADL/IADL tasks.   PERFORMANCE DEFICITS: in functional skills including , cognitive skills including , and psychosocial skills including coping strategies, environmental adaptation, interpersonal interactions, and routines and behaviors.   IMPAIRMENTS: are limiting patient from ADLs, IADLs, and leisure.   CO-MORBIDITIES:  may have co-morbidities  that affects occupational performance. Patient will benefit from skilled OT to address above impairments and improve overall function.  MODIFICATION OR ASSISTANCE TO COMPLETE EVALUATION: Min-Moderate modification of tasks or assist with assess necessary to complete an evaluation.  OT OCCUPATIONAL PROFILE AND HISTORY: Detailed assessment: Review of records and additional review of physical, cognitive, psychosocial history related to current functional performance.  CLINICAL DECISION MAKING: Moderate - several treatment options, min-mod task modification necessary  REHAB POTENTIAL: Good  EVALUATION COMPLEXITY: Moderate  PLAN:  OT FREQUENCY: 2x/week  OT DURATION: 12 weeks  PLANNED INTERVENTIONS: 97535 self care/ADL training, 97110 therapeutic exercise, 97530 therapeutic activity, 97112 neuromuscular re-education, 97140 manual therapy, 97018 paraffin, 97010 moist heat, 97010 cryotherapy, 97034 contrast bath, functional mobility training, energy conservation, coping strategies training, patient/family education, and DME and/or AE instructions  RECOMMENDED OTHER SERVICES: PT, ST  CONSULTED AND AGREED WITH PLAN OF CARE: Patient  PLAN FOR NEXT SESSION: Treatment  Zettie Gootee, MS, OTR/L  08/14/2024, 12:52 PM

## 2024-08-13 NOTE — Therapy (Signed)
 OUTPATIENT PHYSICAL THERAPY NEURO EVALUATION   Patient Name: Janice Rowe MRN: [id number] DOB:05-18-1956, 68 y.o., female Today's Date: 08/13/2024   PCP: Fernande Ophelia JINNY DOUGLAS, MD REFERRING PROVIDER: Maree Jannett POUR, MD  END OF SESSION:   PT End of Session - 08/13/24 1447     Visit Number 1    Number of Visits 24    Date for PT Re-Evaluation 11/05/24    PT Start Time 1447    PT Stop Time 1528    PT Time Calculation (min) 41 min    Equipment Utilized During Treatment Gait belt    Activity Tolerance Patient tolerated treatment well    Behavior During Therapy [facility] for tasks assessed/performed          Past Medical History:  Diagnosis Date   Acute pancreatitis 11/2008   Anemia    Arthritis    GERD (gastroesophageal reflux disease)    Hiatal hernia    Hypertension    Rheumatoid arthritis (HCC)    Sleep apnea    uses c-pap   Vitamin D  deficiency    Past Surgical History:  Procedure Laterality Date   CARPAL TUNNEL RELEASE Right 2013   COLONOSCOPY  2007   COLONOSCOPY WITH PROPOFOL  N/A 11/29/2016   Procedure: COLONOSCOPY WITH PROPOFOL ;  Surgeon: Reyes LELON Cota, MD;  Location: [facility] ENDOSCOPY;  Service: Endoscopy;  Laterality: N/A;   ESOPHAGOGASTRODUODENOSCOPY (EGD) WITH PROPOFOL  N/A 11/29/2016   Procedure: ESOPHAGOGASTRODUODENOSCOPY (EGD) WITH PROPOFOL ;  Surgeon: Reyes LELON Cota, MD;  Location: [facility] ENDOSCOPY;  Service: Endoscopy;  Laterality: N/A;   IR RADIOLOGIST EVAL & MGMT  05/29/2023   TOTAL HIP ARTHROPLASTY Right 02/18/2021   Procedure: TOTAL HIP ARTHROPLASTY ANTERIOR APPROACH;  Surgeon: Kathlynn Sharper, MD;  Location: [facility] ORS;  Service: Orthopedics;  Laterality: Right;   Patient Active Problem List   Diagnosis Date Noted   S/P hip replacement 02/18/2021   S/P total hip arthroplasty 02/18/2021   Anemia 10/04/2018   Essential hypertension 10/04/2018   Rheumatoid arthritis with rheumatoid factor (HCC) 10/04/2018   Vitamin D  deficiency 10/04/2018   Lung nodule seen on  imaging study 08/21/2017   Benign microscopic hematuria 02/06/2017   Cardiac murmur 11/08/2016   Dysphagia 10/11/2016   Encounter for screening colonoscopy 10/11/2016   Rectal bleeding 10/11/2016   Ocular migraine 08/02/2016   Recurrent major depressive disorder, in full remission (HCC) 01/27/2016   Obstructive sleep apnea 09/15/2015    ONSET DATE: Dx w/ Parkinsons June 05, 2024  REFERRING DIAG: Tremors, gait changes, stiffness   THERAPY DIAG:  Muscle weakness (generalized)  Unsteadiness on feet  Difficulty in walking, not elsewhere classified  Other abnormalities of gait and mobility  Tremor  Rationale for Evaluation and Treatment: Rehabilitation  SUBJECTIVE:  SUBJECTIVE STATEMENT: Patient reports that she was diagnosed with Parkinsons June 05, 2024. Patient reporting that she enrolled in [facility] ~4 wks ago; reports tremors are predominantly in her hands & R leg. Pt with relevant PMH of RA & hx of compression fractures, so she would like to gain strength, learn how to safely exercise/be more active, improve endurance, & improve posture. Pt reports she also has had a R total hip replacement. Pt reports she sometimes does have trouble with stairs & has to rely on railing for support.   At baseline, pt reports completing STSs daily & walks 1-2 mi/day.   Pt accompanied by: self  PERTINENT HISTORY: Per neurologist note 08/05/24: Parkinsonism symptoms - Tremors, gait changes, stiffness Parkinson's disease with symptoms including tremors, bradykinesia, rigidity, and postural instability. Symptoms have been worsening, particularly in the right hand, with additional tremors in the lips and legs at rest. There is also a decrease in fine motor skills, balance issues, and handwriting changes. The  differential diagnosis includes essential tremor, but the clinical presentation is more consistent with Parkinson's disease. Patient reports good sense of smell. No active dreams. Constipation, patient takes prune juice. Handwriting getting smaller. Patient reports no problems with loud/soft voice. Difficulties with walking. Taking gabapentin , however slowly weaning off. Carbidopa-levodopa has improved tremors overall.    PAIN:  Are you having pain? No  PRECAUTIONS: Fall  RED FLAGS: None   WEIGHT BEARING RESTRICTIONS: No  FALLS: Has patient fallen in last 6 months? No; reports stumbles, near falls when pivoting, turning, etc.   LIVING ENVIRONMENT: Lives with: lives alone; son lives next door Lives in: House/apartment Stairs: No Has following equipment at home: Single point cane, Quad cane small base, Walker - 2 wheeled, Environmental consultant - 4 wheeled, Wheelchair (manual), Shower bench, Grab bars, and Ramped entry; pt states that she has sufficient equipment from husband who passed w/ PD  PLOF: Independent  PATIENT GOALS: get stronger, have more endurance, improve posture; learn how to exercise safely  OBJECTIVE:  Note: Objective measures were completed at Evaluation unless otherwise noted.  DIAGNOSTIC FINDINGS: Per neurologist note 08/05/24:  We reviewed MRI brain with Ms. Castoro done on 06/11/2024 06/11/2024 MRI brain Wo contrast IMPRESSION:  1. Normal MRI of the brain for age. No evidence of accelerated or  lobar specific volume loss. No evidence of clinically significant  small-vessel ischemic change.  2. NeuroQuant volumetric analysis of the brain, see details on  [employer].    COGNITION: Overall cognitive status: Within functional limits for tasks assessed   SENSATION: Not tested  POSTURE: rounded shoulders and forward head  LOWER EXTREMITY ROM:     Active  WFL for tasks assessed Right Eval Left Eval  Hip flexion    Hip extension    Hip abduction    Hip adduction     Hip internal rotation    Hip external rotation    Knee flexion    Knee extension    Ankle dorsiflexion    Ankle plantarflexion    Ankle inversion    Ankle eversion     (Blank rows = not tested)  LOWER EXTREMITY MMT:    MMT Right Eval Left Eval  Hip flexion 4 4  Hip extension    Hip abduction 4 4  Hip adduction 4 4  Hip internal rotation    Hip external rotation    Knee flexion 4+ 4+  Knee extension 4+ 4+  Ankle dorsiflexion 4 4  Ankle plantarflexion  Ankle inversion    Ankle eversion    (Blank rows = not tested)  BED MOBILITY:  Not tested  TRANSFERS: Sit to stand: Complete Independence  Assistive device utilized: None     Stand to sit: Complete Independence  Assistive device utilized: None     Chair to chair: Complete Independence  Assistive device utilized: None       RAMP:  Not tested  CURB:  Not tested  STAIRS: Findings: Level of Assistance: Complete Independence, Stair Negotiation Technique: Alternating Pattern  with No Rails, and Number of Stairs: 4 GAIT: Findings: Gait Characteristics: step through pattern and Comments: noted deviations with dynamic gait, see FGA for additional details.   FUNCTIONAL TESTS:  30 second sit to stand test: 12x without UE assistance Functional gait assessment:  FUNCTIONAL GAIT ASSESSMENT  Date: 08/13/2024  Score  GAIT LEVEL SURFACE Instructions: Walk at your normal speed from here to the next mark (6 m) [20 ft]. (3) Normal - Walks 6 m (20 ft) in less than 5.5 seconds, no assistive devices, good speed, no evidence for imbalance, normal gait pattern, deviates no more than 15.24 cm (6 in) outside of the 30.48-cm (12-in) walkway width.  2.   CHANGE IN GAIT SPEED Instructions: Begin walking at your normal pace (for 1.5 m [5 ft]). When I tell you "go," walk as fast as you can (for 1.5 m [5 ft]). When I tell you "slow," walk as slowly as you can (for 1.5 m [5 ft]. (3) Normal - Able to smoothly change walking speed without loss of  balance or gait deviation. Shows a significant difference in walking speeds between normal, fast, and slow speeds. Deviates no more than 15.24 cm (6 in) outside of the 30.48-cm (12-in) walkway width.  3.    GAIT WITH HORIZONTAL HEAD TURNS Instructions: Walk from here to the next mark 6 m (20 ft) away. Begin walking at your normal pace. Keep walking straight; after 3 steps, turn your head to the right and keep walking straight while looking to the right. After 3 more steps, turn your head to the left and keep walking straight while looking left. Continue alternating looking right and left. (2) Mild impairment - Performs head turns smoothly with slight change in gait velocity (eg, minor disruption to smooth gait path), deviates 15.24 -25.4 cm (6 -10 in) outside 30.48-cm (12-in) walkway width, or uses an assistive device.  4.   GAIT WITH VERTICAL HEAD TURNS Instructions: Walk from here to the next mark (6 m [20 ft]). Begin walking at your normal pace. Keep walking straight; after 3 steps, tip your head up and keep walking straight while looking up. After 3 more steps, tip your head down, keep walking straight while looking down. Continue  alternating looking up and down every 3 steps until you have completed 2 repetitions in each direction. (2) Mild impairment - Performs task with slight change in gait velocity (eg, minor disruption to smooth gait path), deviates 15.24 -25.4 cm (6 -10 in) outside 30.48-cm (12-in) walkway width or uses assistive device.  5.  GAIT AND PIVOT TURN Instructions: Begin with walking at your normal pace. When I tell you, "turn and stop," turn as quickly as you can to face the opposite direction and stop. (2) Mild impairment - Pivot turns safely in 3 seconds and stops with no loss of balance, or pivot turns safely within 3 seconds and stops with mild imbalance, requires small steps to catch balance  6.   STEP OVER  OBSTACLE Instructions: Begin walking at your normal speed. When you come  to the shoe box, step over it, not around it, and keep walking. (1) Moderate impairment - Is able to step over one shoe box (11.43 cm [4.5 in] total height) but must slow down and adjust steps to clear box safely. May require verbal cueing.  7.   GAIT WITH NARROW BASE OF SUPPORT Instructions: Walk on the floor with arms folded across the chest, feet aligned heel to toe in tandem for a distance of 3.6 m [12 ft]. The number of steps taken in a straight line are counted for a maximum of 10 steps. (1) Moderate impairment - Ambulates 4 -7 steps.  8.   GAIT WITH EYES CLOSED Instructions: Walk at your normal speed from here to the next mark (6 m [20 ft]) with your eyes closed. (2) Mild impairment - Walks 6 m (20 ft), uses assistive device, slower speed, mild gait deviations, deviates 15.24 -25.4 cm (6 -10 in) outside 30.48-cm (12-in) walkway width. Ambulates 6 m (20 ft) in less than 9 seconds but greater than 7 seconds  9.   AMBULATING BACKWARDS Instructions: Walk backwards until I tell you to stop (2) Mild impairment - Walks 6 m (20 ft), uses assistive device, slower speed, mild gait deviations, deviates 15.24 -25.4 cm (6 -10 in) outside 30.48-cm (12-in) walkway width  10. STEPS Instructions: Walk up these stairs as you would at home (ie, using the rail if necessary). At the top turn around and walk down. (3) Normal-Alternating feet, no rail.  Total 21/30   Interpretation of scores: Non-Specific Older Adults Cutoff Score: <=22/30 = risk of falls Parkinson's Disease Cutoff score <15/30= fall risk (Hoehn & Yahr 1-4)  Minimally Clinically Important Difference (MCID)  Stroke (acute, subacute, and chronic) = MDC: 4.2 points Vestibular (acute) = MDC: 6 points Community Dwelling Older Adults =  MCID: 4 points Parkinson's Disease  =  MDC: 4.3 points  (Academy of Neurologic Physical Therapy (nd). Functional Gait Assessment. Retrieved from  https://www.neuropt.org/docs/default-source/cpgs/core-outcome-measures/function-gait-assessment-pocket-guide-proof9-(2).pdf?sfvrsn=b60f35043_0.):   PATIENT SURVEYS:  ABC scale: The Activities-Specific Balance Confidence (ABC) Scale 0% 10 20 30  40 50 60 70 80 90 100% No confidence<->completely confident  "How confident are you that you will not lose your balance or become unsteady when you . . .   Date tested 08/13/2024  Walk around the house 90%  2. Walk up or down stairs 90%  3. Bend over and pick up a slipper from in front of a closet floor 80%  4. Reach for a small can off a shelf at eye level 90%  5. Stand on tip toes and reach for something above your head 80%  6. Stand on a chair and reach for something 50%  7. Sweep the floor 70%  8. Walk outside the house to a car parked in the driveway 90%  9. Get into or out of a car 90%  10. Walk across a parking lot to the mall 90%  11. Walk up or down a ramp 90%  12. Walk in a crowded mall where people rapidly walk past you 70%  13. Are bumped into by people as you walk through the mall 80%  14. Step onto or off of an escalator while you are holding onto the railing 70%  15. Step onto or off an escalator while holding onto parcels such that you cannot hold onto the railing 70%  16. Walk outside on icy sidewalks 10%  Total: #/16 75.625%  TREATMENT DATE: 08/13/2024  Today's session focused on obtaining subjective history, completing physical performance measures, & completing self-reported outcome measures.    FGA:   [facility] PT Assessment - 08/13/24 0001       Functional Gait  Assessment   Gait assessed  Yes    Gait Level Surface Walks 20 ft in less than 5.5 sec, no assistive devices, good speed, no evidence for imbalance, normal gait pattern, deviates no more than 6 in outside of the 12 in walkway width.     Change in Gait Speed Able to smoothly change walking speed without loss of balance or gait deviation. Deviate no more than 6 in outside of the 12 in walkway width.    Gait with Horizontal Head Turns Performs head turns smoothly with slight change in gait velocity (eg, minor disruption to smooth gait path), deviates 6-10 in outside 12 in walkway width, or uses an assistive device.    Gait with Vertical Head Turns Performs task with slight change in gait velocity (eg, minor disruption to smooth gait path), deviates 6 - 10 in outside 12 in walkway width or uses assistive device    Gait and Pivot Turn Pivot turns safely in greater than 3 sec and stops with no loss of balance, or pivot turns safely within 3 sec and stops with mild imbalance, requires small steps to catch balance.    Step Over Obstacle Is able to step over one shoe box (4.5 in total height) but must slow down and adjust steps to clear box safely. May require verbal cueing.    Gait with Narrow Base of Support Ambulates 4-7 steps.    Gait with Eyes Closed Walks 20 ft, uses assistive device, slower speed, mild gait deviations, deviates 6-10 in outside 12 in walkway width. Ambulates 20 ft in less than 9 sec but greater than 7 sec.    Ambulating Backwards Walks 20 ft, uses assistive device, slower speed, mild gait deviations, deviates 6-10 in outside 12 in walkway width.    Steps Alternating feet, no rail.    Total Score 21          30sec STS: 12x without UE support  Activities-specific Balance Confidence Scale:  Score: 75.625% Increased risk of falls in community-dwelling, older adults <80% (79.89%)  0% = no confidence - 100% = complete confidence (ANPTA Core Set of Outcome Measures for Adults with Neurologic Conditions, 2018)   Pt educated to continue STS & daily walking as part of HEP.  Completed tandem walking with bar support 3x along length ~5' down & back with 1 UE support for balance. Pt educated to complete as part of HEP at  counter with sink for safety. Pt denying handout for HEP at this time.   Pt with understanding of PD progressive nature, as her husband had PD. Pt educated throughout session on results of physical performance measures & benefit of PT to address deficits & reach personal goals. Pt educated on POC.     PATIENT EDUCATION: Education details: POC, findings of exam, HEP Person educated: Patient Education method: Explanation Education comprehension: verbalized understanding and needs further education  HOME EXERCISE PROGRAM: Access Code: [email or web address] URL: [email or web address] Date: 08/13/2024 Prepared by: Chiquita Silvan  Exercises - Tandem Walking with Counter Support  - 1 x daily - 7 x weekly - 3 sets - 2-3 reps  GOALS: Goals reviewed with patient? Yes  SHORT TERM GOALS: Target date: 09/24/24  Patient will be independent with home exercise program to improve strength/mobility  for increased functional independence with ADLs and mobility. Baseline: establish formal HEP at future visits Goal status: INITIAL   LONG TERM GOALS: Target date: 11/05/24  Patient will improve ABC scale score >80% to demonstrate increased confidence with functional mobility and ADLs.   Baseline: 75.625% Goal status: INITIAL  2.  Patient will improve 30sec STS score to 15x to demonstrate improved functional strength & endurance.  Baseline: 12x Goal status: INITIAL  3.  Patient will increase MiniBest Test score to >18/28 to indicate a reduced risk for falling and demonstrate increased independence with functional mobility and ADLs. Baseline: assess at future session Goal status: INITIAL  4.  Patient will increase six minute walk test distance to >1026ft for progression to community level ambulation, demonstrating improved gait endurance. Baseline: assess at future session Goal status: INITIAL   ASSESSMENT:  CLINICAL IMPRESSION:  Patient is a 68 y.o. female who was seen today for physical  therapy evaluation and treatment for tremors, gait changes, & stiffness secondary to PD. Patient presenting with some functional strength deficits & dynamic gait deficits as evidenced by 30 sec STS & FGA scoring respectively. Pt with greatest difficulty with stepping over obstacle & gait with narrow BOS. Pt also demonstrating decreased confidence with functional mobility as evidenced by ABC score of 75.6%. Plan to further assess dynamic balance via Mini BESTest & endurance via at future session. The pt will benefit from further skilled PT to improve these deficits in order to increase QOL and ease/safety with ADLs.    OBJECTIVE IMPAIRMENTS: Abnormal gait, decreased activity tolerance, decreased balance, decreased endurance, difficulty walking, and decreased strength.   ACTIVITY LIMITATIONS: carrying, lifting, bending, stairs, and reach over head  PARTICIPATION LIMITATIONS: laundry, shopping, community activity, and yard work  PERSONAL FACTORS: Age, Sex, and 3+ comorbidities: RA, HTN, hx of compression fractures are also affecting patient's functional outcome.   REHAB POTENTIAL: Good  CLINICAL DECISION MAKING: Evolving/moderate complexity  EVALUATION COMPLEXITY: Moderate  PLAN:  PT FREQUENCY: 1-2x/week  PT DURATION: 12 weeks  PLANNED INTERVENTIONS: 97164- PT Re-evaluation, 97750- Physical Performance Testing, 97110-Therapeutic exercises, 97530- Therapeutic activity, 97112- Neuromuscular re-education, 97535- Self Care, [id number]- Manual therapy, [phone]- Gait training, [phone]- Canalith repositioning, [phone] (1-2 muscles), [phone] (3+ muscles)- Dry Needling, Patient/Family education, Balance training, Stair training, Joint mobilization, Joint manipulation, Spinal manipulation, Spinal mobilization, Vestibular training, Cryotherapy, and Moist heat  PLAN FOR NEXT SESSION:  Mini BESTest Provide APTA Parkinson Disease & Exercise fact sheet Establish HEP for strengthening Dynamic  balance/gait  Chiquita Silvan, Student-PT 08/13/2024, 4:32 PM

## 2024-08-18 ENCOUNTER — Ambulatory Visit
Admission: RE | Admit: 2024-08-18 | Discharge: 2024-08-18 | Disposition: A | Discharge status: Home / Self Care | Source: Ambulatory Visit

## 2024-08-18 DIAGNOSIS — Z1231 Encounter for screening mammogram for malignant neoplasm of breast: Secondary | ICD-10-CM | POA: Insufficient documentation

## 2024-08-19 ENCOUNTER — Ambulatory Visit

## 2024-08-19 DIAGNOSIS — R278 Other lack of coordination: Secondary | ICD-10-CM

## 2024-08-19 DIAGNOSIS — M6281 Muscle weakness (generalized): Secondary | ICD-10-CM

## 2024-08-19 DIAGNOSIS — R262 Difficulty in walking, not elsewhere classified: Secondary | ICD-10-CM

## 2024-08-19 DIAGNOSIS — R2681 Unsteadiness on feet: Secondary | ICD-10-CM

## 2024-08-19 NOTE — Therapy (Signed)
 OUTPATIENT PHYSICAL THERAPY TREATMENT   Patient Name: Janice Rowe MRN: [id number] DOB:1956/09/14, 68 y.o., female Today's Date: 08/19/2024   PCP: Fernande Ophelia JINNY DOUGLAS, MD REFERRING PROVIDER: Maree Jannett POUR, MD  END OF SESSION:   PT End of Session - 08/19/24 1528     Visit Number 2    Number of Visits 24    Date for Recertification  11/05/24    Authorization Type [employer]    Progress Note Due on Visit 10    PT Start Time 1530    PT Stop Time 1610    PT Time Calculation (min) 40 min    Equipment Utilized During Treatment Gait belt    Activity Tolerance Patient tolerated treatment well;No increased pain    Behavior During Therapy [facility] for tasks assessed/performed          Past Medical History:  Diagnosis Date   Acute pancreatitis 11/2008   Anemia    Arthritis    GERD (gastroesophageal reflux disease)    Hiatal hernia    Hypertension    Rheumatoid arthritis (HCC)    Sleep apnea    uses c-pap   Vitamin D  deficiency    Past Surgical History:  Procedure Laterality Date   CARPAL TUNNEL RELEASE Right 2013   COLONOSCOPY  2007   COLONOSCOPY WITH PROPOFOL  N/A 11/29/2016   Procedure: COLONOSCOPY WITH PROPOFOL ;  Surgeon: Reyes LELON Cota, MD;  Location: [facility] ENDOSCOPY;  Service: Endoscopy;  Laterality: N/A;   ESOPHAGOGASTRODUODENOSCOPY (EGD) WITH PROPOFOL  N/A 11/29/2016   Procedure: ESOPHAGOGASTRODUODENOSCOPY (EGD) WITH PROPOFOL ;  Surgeon: Reyes LELON Cota, MD;  Location: [facility] ENDOSCOPY;  Service: Endoscopy;  Laterality: N/A;   IR RADIOLOGIST EVAL & MGMT  05/29/2023   TOTAL HIP ARTHROPLASTY Right 02/18/2021   Procedure: TOTAL HIP ARTHROPLASTY ANTERIOR APPROACH;  Surgeon: Kathlynn Sharper, MD;  Location: [facility] ORS;  Service: Orthopedics;  Laterality: Right;   Patient Active Problem List   Diagnosis Date Noted   S/P hip replacement 02/18/2021   S/P total hip arthroplasty 02/18/2021   Anemia 10/04/2018   Essential hypertension 10/04/2018   Rheumatoid arthritis with rheumatoid  factor (HCC) 10/04/2018   Vitamin D  deficiency 10/04/2018   Lung nodule seen on imaging study 08/21/2017   Benign microscopic hematuria 02/06/2017   Cardiac murmur 11/08/2016   Dysphagia 10/11/2016   Encounter for screening colonoscopy 10/11/2016   Rectal bleeding 10/11/2016   Ocular migraine 08/02/2016   Recurrent major depressive disorder, in full remission 01/27/2016   Obstructive sleep apnea 09/15/2015    ONSET DATE: Dx w/ Parkinsons June 05, 2024  REFERRING DIAG: Tremors, gait changes, stiffness   THERAPY DIAG:  Muscle weakness (generalized)  Other lack of coordination  Unsteadiness on feet  Difficulty in walking, not elsewhere classified  Rationale for Evaluation and Treatment: Rehabilitation  SUBJECTIVE:  SUBJECTIVE STATEMENT: Patient reports that she was diagnosed with Parkinsons June 05, 2024. Patient reporting that she enrolled in [facility] ~4 wks ago; reports tremors are predominantly in her hands & R leg. Pt with relevant PMH of RA & hx of compression fractures, so she would like to gain strength, learn how to safely exercise/be more active, improve endurance, & improve posture. Pt reports she also has had a R total hip replacement. Pt reports she sometimes does have trouble with stairs & has to rely on railing for support.   At baseline, pt reports completing STSs daily & walks 1-2 mi/day.   Pt accompanied by: self  PERTINENT HISTORY: Per neurologist note 08/05/24: Parkinsonism symptoms - Tremors, gait changes, stiffness Parkinson's disease with symptoms including tremors, bradykinesia, rigidity, and postural instability. Symptoms have been worsening, particularly in the right hand, with additional tremors in the lips and legs at rest. There is also a decrease in fine motor  skills, balance issues, and handwriting changes. The differential diagnosis includes essential tremor, but the clinical presentation is more consistent with Parkinson's disease. Patient reports good sense of smell. No active dreams. Constipation, patient takes prune juice. Handwriting getting smaller. Patient reports no problems with loud/soft voice. Difficulties with walking. Taking gabapentin , however slowly weaning off. Carbidopa-levodopa has improved tremors overall.    PAIN:  Are you having pain? No  PRECAUTIONS: Fall  RED FLAGS: None   WEIGHT BEARING RESTRICTIONS: No  FALLS: Has patient fallen in last 6 months? No; reports stumbles, near falls when pivoting, turning, etc.   LIVING ENVIRONMENT: Lives with: lives alone; son lives next door Lives in: House/apartment Stairs: No Has following equipment at home: Single point cane, Quad cane small base, Walker - 2 wheeled, Environmental consultant - 4 wheeled, Wheelchair (manual), Shower bench, Grab bars, and Ramped entry; pt states that she has sufficient equipment from husband who passed w/ PD  PLOF: Independent  PATIENT GOALS: get stronger, have more endurance, improve posture; learn how to exercise safely  OBJECTIVE:  Note: Objective measures were completed at Evaluation unless otherwise noted.                                                                                               TREATMENT DATE: 08/19/2024 - -Mini BesTest - Cable resisted walking with17.5lb forward and backward  - alternate 6 step taps x40 holding onto 15lb ball  - `80 degree turns xover rebounding and redTB at kneex x20     [facility] PT Assessment - 08/19/24 0001       Balance   Balance Assessed Yes      Standardized Balance Assessment   Standardized Balance Assessment Mini-BESTest      Mini-BESTest   Sit To Stand Normal: Comes to stand without use of hands and stabilizes independently.    Rise to Toes Moderate: Heels up, but not full range (smaller than when  holding hands), OR noticeable instability for 3 s.    Stand on one leg (left) Moderate: < 20 s    Stand on one leg (right) Normal: 20 s.    Stand on one leg -  lowest score 1    Compensatory Stepping Correction - Forward Normal: Recovers independently with a single, large step (second realignement is allowed).    Compensatory Stepping Correction - Backward Moderate: More than one step is required to recover equilibrium    Compensatory Stepping Correction - Left Lateral Normal: Recovers independently with 1 step (crossover or lateral OK)    Compensatory Stepping Correction - Right Lateral Normal: Recovers independently with 1 step (crossover or lateral OK)    Stepping Corredtion Lateral - lowest score 2    Stance - Feet together, eyes open, firm surface  Normal: 30s    Stance - Feet together, eyes closed, foam surface  Moderate: < 30s    Incline - Eyes Closed Normal: Stands independently 30s and aligns with gravity    Change in Gait Speed Normal: Significantly changes walkling speed without imbalance    Walk with head turns - Horizontal Moderate: performs head turns with reduction in gait speed.    Walk with pivot turns Normal: Turns with feet close FAST (< 3 steps) with good balance.    Step over obstacles Normal: Able to step over box with minimal change of gait speed and with good balance.    Timed UP & GO with Dual Task Normal: No noticeable change in sitting, standing or walking while backward counting when compared to TUG without   7.02sec; 8.61sec   Mini-BEST total score 23           PATIENT EDUCATION: Education details: POC, findings of exam, HEP Person educated: Patient Education method: Explanation Education comprehension: verbalized understanding and needs further education  HOME EXERCISE PROGRAM: Access Code: [email or web address] URL: [email or web address] Date: 08/13/2024 Prepared by: Chiquita Silvan  Exercises - Tandem Walking with Counter Support  - 1 x daily - 7 x  weekly - 3 sets - 2-3 reps  GOALS: Goals reviewed with patient? Yes  SHORT TERM GOALS: Target date: 09/24/24  Patient will be independent with home exercise program to improve strength/mobility for increased functional independence with ADLs and mobility. Baseline: establish formal HEP at future visits Goal status: INITIAL  LONG TERM GOALS: Target date: 11/05/24  Patient will improve ABC scale score >80% to demonstrate increased confidence with functional mobility and ADLs.   Baseline: 75.625% Goal status: INITIAL  2.  Patient will improve 30sec STS score to 15x to demonstrate improved functional strength & endurance.  Baseline: 12x Goal status: INITIAL  3.  Patient will increase MiniBest Test score to >18/28 to indicate a reduced risk for falling and demonstrate increased independence with functional mobility and ADLs. Baseline: 23/28 Goal status: INITIAL  4.  Patient will increase six minute walk test distance to >1034ft for progression to community level ambulation, demonstrating improved gait endurance. Baseline: 08/19/24: 1385ft AMB;  Goal status: INITIAL  ASSESSMENT:  CLINICAL IMPRESSION:  Completed min BestTest and , then commenced. Performance is better on balance measure than anticipated goal, will update in future. The pt will benefit from further skilled PT to improve these deficits in order to increase QOL and ease/safety with ADLs.    OBJECTIVE IMPAIRMENTS: Abnormal gait, decreased activity tolerance, decreased balance, decreased endurance, difficulty walking, and decreased strength.   ACTIVITY LIMITATIONS: carrying, lifting, bending, stairs, and reach over head  PARTICIPATION LIMITATIONS: laundry, shopping, community activity, and yard work  PERSONAL FACTORS: Age, Sex, and 3+ comorbidities: RA, HTN, hx of compression fractures are also affecting patient's functional outcome.   REHAB POTENTIAL: Good CLINICAL DECISION MAKING: Evolving/moderate  complexity EVALUATION COMPLEXITY: Moderate  PLAN:  PT FREQUENCY: 1-2x/week PT DURATION: 12 weeks PLANNED INTERVENTIONS: 97164- PT Re-evaluation, 97750- Physical Performance Testing, 97110-Therapeutic exercises, 97530- Therapeutic activity, 97112- Neuromuscular re-education, 97535- Self Care, 02859- Manual therapy, [phone]- Gait training, [phone]- Canalith repositioning, [phone] (1-2 muscles), 20561 (3+ muscles)- Dry Needling, Patient/Family education, Balance training, Stair training, Joint mobilization, Joint manipulation, Spinal manipulation, Spinal mobilization, Vestibular training, Cryotherapy, and Moist heat  PLAN FOR NEXT SESSION:  Mini BESTest Provide APTA Parkinson Disease & Exercise fact sheet Establish HEP for strengthening Dynamic balance/gait  Imogine Carvell C, PT 08/19/2024, 3:33 PM  3:34 PM, 08/19/24 Peggye JAYSON Linear, PT, DPT Physical Therapist - [facility]  Outpatient Physical Therapy- Main Campus [phone]

## 2024-08-19 NOTE — Therapy (Signed)
 OUTPATIENT OCCUPATIONAL THERAPY NEURO TREATMENT NOTE  Patient Name: Janice Rowe MRN: [id number] DOB:11/17/1956, 68 y.o., female Today's Date: 08/19/2024  PCP: Fernande Ophelia PARAS. III, MD REFERRING PROVIDER: Maree Hila, MARLA, MD  END OF SESSION:  OT End of Session - 08/19/24 1538     Visit Number 2    Number of Visits 24    Date for Recertification  11/05/24    OT Start Time 1445    OT Stop Time 1530    OT Time Calculation (min) 45 min    Activity Tolerance Patient tolerated treatment well    Behavior During Therapy [facility] for tasks assessed/performed         Past Medical History:  Diagnosis Date   Acute pancreatitis 11/2008   Anemia    Arthritis    GERD (gastroesophageal reflux disease)    Hiatal hernia    Hypertension    Rheumatoid arthritis (HCC)    Sleep apnea    uses c-pap   Vitamin D  deficiency    Past Surgical History:  Procedure Laterality Date   CARPAL TUNNEL RELEASE Right 2013   COLONOSCOPY  2007   COLONOSCOPY WITH PROPOFOL  N/A 11/29/2016   Procedure: COLONOSCOPY WITH PROPOFOL ;  Surgeon: Reyes LELON Cota, MD;  Location: [facility] ENDOSCOPY;  Service: Endoscopy;  Laterality: N/A;   ESOPHAGOGASTRODUODENOSCOPY (EGD) WITH PROPOFOL  N/A 11/29/2016   Procedure: ESOPHAGOGASTRODUODENOSCOPY (EGD) WITH PROPOFOL ;  Surgeon: Reyes LELON Cota, MD;  Location: [facility] ENDOS[facility];  Service: Endoscopy;  Laterality: N/A;   IR RADIOLOGIST EVAL & MGMT  05/29/2023   TOTAL HIP ARTHROPLASTY Right 02/18/2021   Procedure: TOTAL HIP ARTHROPLASTY ANTERIOR APPROACH;  Surgeon: Kathlynn Sharper, MD;  Location: [facility] ORS;  Service: Orthopedics;  Laterality: Right;   Patient Active Problem List   Diagnosis Date Noted   S/P hip replacement 02/18/2021   S/P total hip arthroplasty 02/18/2021   Anemia 10/04/2018   Essential hypertension 10/04/2018   Rheumatoid arthritis with rheumatoid factor (HCC) 10/04/2018   Vitamin D  deficiency 10/04/2018   Lung nodule seen on imaging study 08/21/2017   Benign microscopic  hematuria 02/06/2017   Cardiac murmur 11/08/2016   Dysphagia 10/11/2016   Encounter for screening colonoscopy 10/11/2016   Rectal bleeding 10/11/2016   Ocular migraine 08/02/2016   Recurrent major depressive disorder, in full remission 01/27/2016   Obstructive sleep apnea 09/15/2015   ONSET DATE: 2024  REFERRING DIAG:  Parkinson's Disease  THERAPY DIAG:  Muscle weakness (generalized)  Other lack of coordination  Rationale for Evaluation and Treatment: Rehabilitation  SUBJECTIVE:  SUBJECTIVE STATEMENT: There is so much I want to do, but all of these exercises that I'm trying to do to stay ahead of this Parkinson's take up so much time.  Pt accompanied by: self  PERTINENT HISTORY: Pt. is a 68 y.o. female who was is currently being followed by Dr. Maree in Neurology for Parkinson's Disease. PMHx includes: Tremors, RA, and Vertebral Fractures.  PRECAUTIONS: None  WEIGHT BEARING RESTRICTIONS: No  PAIN:  Are you having pain? 2/10 pain in back at initial eval, however typicially 4/10  FALLS: Has patient fallen in last 6 months? No  LIVING ENVIRONMENT: Lives with: Alone; son  lives next door Lives in: House/apartment Stairs: Ramped entrance Has following equipment at home: Single point cane, Environmental consultant - 2 wheeled, Wheelchair (manual), Shower bench, bed side commode, and Grab bars  PLOF: Independent  PATIENT GOALS: To improve FMC   OBJECTIVE:  Note: Objective measures were completed at Evaluation unless otherwise noted.  HAND DOMINANCE: Right  ADLs: Overall ADLs:  Transfers/ambulation related to ADLs: Eating:  Independent, difficulty cutting steak Grooming:  Independent UB Dressing: Difficulty with fastening a bra. Fastens in front and is hard to turn it to the back, Difficulty buttoning LB Dressing: Independent with slide on shoes, elastic pants.  Toileting:  difficulty using right hand for posterior toilet hygiene care-uses modified technique. Bathing:  Weakness with  UE's noted when washing UEs, shaving legs is difficulty Tub Shower transfers: Modified independent   IADLs: Shopping: Independent Light housekeeping: Mopping is difficult  2/2 back pain Meal Prep: Independent heating items up, cooking eggs. Community mobility: Independent- increased awareness;  has slowed down Medication management: Unable to grasp pills from the tabletop surface. Is able to from her hand. Pillbox-2 weeks at a time Financial management: Transitioning to online accounts Handwriting: 50% legible, micrographia-smaller towards end of her name, has jaw tremor when writing.            Other: Pt. 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   MOBILITY STATUS: Independent  POSTURE COMMENTS:  No Significant postural limitations  ACTIVITY TOLERANCE: Activity tolerance: fatigues after 20 min. of activity  FUNCTIONAL OUTCOME MEASURES: MAM-20: 56/80  UPPER EXTREMITY ROM:    Active ROM Right Eval WFL Left Eval [facility]  Shoulder flexion    Shoulder abduction    Shoulder adduction    Shoulder extension    Shoulder internal rotation    Shoulder external rotation    Elbow flexion    Elbow extension    Wrist flexion    Wrist extension    Wrist ulnar deviation    Wrist radial deviation    Wrist pronation    Wrist supination    (Blank rows = not tested)  UPPER EXTREMITY MMT:     MMT Right eval Left Eval 5/5  Shoulder flexion 4/5   Shoulder abduction 4/5   Shoulder adduction    Shoulder extension    Shoulder internal rotation    Shoulder external rotation    Middle trapezius    Lower trapezius    Elbow flexion 5/5   Elbow extension 5/5   Wrist flexion 5/5   Wrist extension 5/5   Wrist ulnar deviation    Wrist radial deviation    Wrist pronation 5/5   Wrist supination 5/5   (Blank rows = not tested)  HAND FUNCTION: Grip strength: Right: 35 lbs; Left: 47 lbs, Lateral  pinch: Right: 13 lbs, Left: 12 lbs, and 3 point pinch: Right: 6 lbs, Left: 10 lbs  COORDINATION: 9 Hole Peg test: Right: 40 sec; Left: 24 sec  SENSATION: Light touch: WFL Proprioception: WFL  EDEMA:  N/A  MUSCLE TONE: Intact  COGNITION: Overall cognitive status: Within functional limits for tasks assessed  **Pt. Reports changes with word finding. Is interested in an ST referral following OT/PT**  VISION: Subjective report: Wears glasses. Upgraded prescription 3-4 months ago  PERCEPTION: WFL  PRAXIS: [facility]  TREATMENT DATE: 08/19/24  Therapeutic Activity: -Issued pink theraputty and instructed pt in strengthening and coordination exercises for R/L hands, including gross grasping, lateral/2 point/3 point pinching, digit abd/add, and digging coins out of putty.  Able to return demo with intermittent min vc for technique to improve quality of movement.  Encouraged completion 5-15 min, 1-2x per day.    Self Care: -Handwriting with trials of compensation strategies to increase legibility/reduce micrographia/minimize tremor: finger flicks, lined paper, weighted/unweighted pens, rubber pen grip -Condition management education:   -HEP considerations: Completion time/reps/duration should depend on pain levels.  Low reps recommended d/t RA hx; avoid overuse of  hands.  Rest before reaching point of pain.  -Limit LSVT exercises to days off from [employer], work with putty while watching tv  PATIENT EDUCATION: Education details: OT services, POC, goals and ADL/IADL functional Status.  Person educated: Patient Education method: Medical illustrator Education comprehension: verbalized understanding, returned demonstration, verbal cues required, and tactile cues required  HOME EXERCISE PROGRAM: Pink theraputty  GOALS: Goals reviewed with patient?  Yes  SHORT TERM GOALS: Target date: 09/24/2024  Pt. Will be independent with HEPs for the RUE, and hand strength, and coordination Baseline: Eval: No current HEP Goal status: INITIAL  LONG TERM GOALS: Target date: 11/05/2024  Pt. Will improve right shoulder strength by 2 mm grades to assist with ADLs, and IADLs. Baseline: Eval: Right shoulder flexion: 4/5, Abduction: 4/5. Goal status: INITIAL  2.  Pt. Will improve right grip strength by 5# to be able to securely hold, and pass weighted cups of soup. Baseline: Eval: Right: 35#, Left: 47# Goal status: INITIAL  3.  Pt. Will improve right hand pinch strength by 3# to be able to cut meat more efficiently Baseline: Eval: Lateral pinch: Right: 13 lbs, Left: 12 lbs, and 3 point pinch: Right: 6 lbs, Left: 10 lbs Goal status: INITIAL  4.  Pt. Will improve right hand [facility] skills in order to be able to grasp small items from a flat tabletop surface. Baseline: Eval: Right: 40 sec., Left: 24 sec. Goal status: INITIAL  5.  Pt. Will identify/demonstrate 3 compensatory strategies for tremors during ADL/IADLs, and when holding a hymn book in choir. Baseline: Eval: Education to be provided . Pt. Has difficulty stabilizing a hymn book in choir. Goal status: INITIAL  6.  Pt. Will write one  sentence with 100% legibility with consistent letter size throughout. Baseline: Eval: Name only: 50%,with micrographia 1/2 way through. Goal status: INITIAL  ASSESSMENT:  CLINICAL IMPRESSION: Pt with good tolerance to theraputty exercises, and although pt remains motivated, pt verbalizes some overwhelm in acquiring additional HEPs to manage Parkinson's symptoms.  Pt has been encouraged to work in various exercises into regular parts of her day, and to alternate LSVT and [employer] to different days of the week.  Will continue to encourage frequency adjustments as needed.  With writing trials, pt does demo moderate micrographia.  Pt reports using blank paper  to take notes at church.  OT encouraged use of lined paper when able, in order to benefit from visual feedback for maintaining letter height.  Pt receptive to this.  Trial of dycem beneath paper to maximize proprioceptive input, though no benefit felt from pt.  Fair benefit from rubber pen grip and pen weight, though pt does have a preferred wide grip pen at home that she feels comfortable with.  OT has encouraged pt bring to next session.  Encouraged pt implement finger flicks prior to  writing and other [facility] activities to calibrate self for large movement patterns.  Pt. will continue to benefit from OT services to work on improving her dominant RUE functioning in order to maximize overall independence with ADLs/IADL tasks, and to provide education on work simplification/activity modification, and compensatory strategies to manage tremors with daily tasks.     PERFORMANCE DEFICITS: in functional skills including , cognitive skills including , and psychosocial skills including coping strategies, environmental adaptation, interpersonal interactions, and routines and behaviors.   IMPAIRMENTS: are limiting patient from ADLs, IADLs, and leisure.   CO-MORBIDITIES: may have co-morbidities  that affects occupational performance. Patient will benefit from skilled OT to address above impairments and improve overall function.  MODIFICATION OR ASSISTANCE TO COMPLETE EVALUATION: Min-Moderate modification of tasks or assist with assess necessary to complete an evaluation.  OT OCCUPATIONAL PROFILE AND HISTORY: Detailed assessment: Review of records and additional review of physical, cognitive, psychosocial history related to current functional performance.  CLINICAL DECISION MAKING: Moderate - several treatment options, min-mod task modification necessary  REHAB POTENTIAL: Good  EVALUATION COMPLEXITY: Moderate    PLAN:  OT FREQUENCY: 2x/week  OT DURATION: 12 weeks  PLANNED INTERVENTIONS: 97535 self  care/ADL training, 02889 therapeutic exercise, 97530 therapeutic activity, 97112 neuromuscular re-education, 97140 manual therapy, 97018 paraffin, 02989 moist heat, 97010 cryotherapy, 97034 contrast bath, functional mobility training, energy conservation, coping strategies training, patient/family education, and DME and/or AE instructions  RECOMMENDED OTHER SERVICES: PT, ST  CONSULTED AND AGREED WITH PLAN OF CARE: Patient  PLAN FOR NEXT SESSION: Treatment  Inocente Blazing, MS, OTR/L   08/19/2024, 3:42 PM

## 2024-08-21 ENCOUNTER — Ambulatory Visit: Admitting: Physical Therapy

## 2024-08-21 ENCOUNTER — Encounter

## 2024-08-26 ENCOUNTER — Ambulatory Visit

## 2024-08-26 DIAGNOSIS — M6281 Muscle weakness (generalized): Secondary | ICD-10-CM

## 2024-08-26 DIAGNOSIS — R278 Other lack of coordination: Secondary | ICD-10-CM

## 2024-08-26 DIAGNOSIS — R2681 Unsteadiness on feet: Secondary | ICD-10-CM

## 2024-08-26 DIAGNOSIS — R262 Difficulty in walking, not elsewhere classified: Secondary | ICD-10-CM

## 2024-08-26 NOTE — Therapy (Signed)
 OUTPATIENT OCCUPATIONAL THERAPY NEURO TREATMENT NOTE  Patient Name: Janice Rowe MRN: [id number] DOB:15-Aug-1956, 68 y.o., female Today's Date: 08/26/2024  PCP: Fernande Ophelia PARAS. III, MD REFERRING PROVIDER: Maree Hila, MARLA, MD  END OF SESSION:  OT End of Session - 08/26/24 1927     Visit Number 3    Number of Visits 24    Date for Recertification  11/05/24    OT Start Time 1445    OT Stop Time 1530    OT Time Calculation (min) 45 min    Activity Tolerance Patient tolerated treatment well    Behavior During Therapy [facility] for tasks assessed/performed         Past Medical History:  Diagnosis Date   Acute pancreatitis 11/2008   Anemia    Arthritis    GERD (gastroesophageal reflux disease)    Hiatal hernia    Hypertension    Rheumatoid arthritis (HCC)    Sleep apnea    uses c-pap   Vitamin D  deficiency    Past Surgical History:  Procedure Laterality Date   CARPAL TUNNEL RELEASE Right 2013   COLONOSCOPY  2007   COLONOSCOPY WITH PROPOFOL  N/A 11/29/2016   Procedure: COLONOSCOPY WITH PROPOFOL ;  Surgeon: Reyes LELON Cota, MD;  Location: [facility] ENDOSCOPY;  Service: Endoscopy;  Laterality: N/A;   ESOPHAGOGASTRODUODENOSCOPY (EGD) WITH PROPOFOL  N/A 11/29/2016   Procedure: ESOPHAGOGASTRODUODENOSCOPY (EGD) WITH PROPOFOL ;  Surgeon: Reyes LELON Cota, MD;  Location: [facility] ENDOSCOPY;  Service: Endoscopy;  Laterality: N/A;   IR RADIOLOGIST EVAL & MGMT  05/29/2023   TOTAL HIP ARTHROPLASTY Right 02/18/2021   Procedure: TOTAL HIP ARTHROPLASTY ANTERIOR APPROACH;  Surgeon: Kathlynn Sharper, MD;  Location: [facility] ORS;  Service: Orthopedics;  Laterality: Right;   Patient Active Problem List   Diagnosis Date Noted   S/P hip replacement 02/18/2021   S/P total hip arthroplasty 02/18/2021   Anemia 10/04/2018   Essential hypertension 10/04/2018   Rheumatoid arthritis with rheumatoid factor (HCC) 10/04/2018   Vitamin D  deficiency 10/04/2018   Lung nodule seen on imaging study 08/21/2017   Benign microscopic  hematuria 02/06/2017   Cardiac murmur 11/08/2016   Dysphagia 10/11/2016   Encounter for screening colonoscopy 10/11/2016   Rectal bleeding 10/11/2016   Ocular migraine 08/02/2016   Recurrent major depressive disorder, in full remission 01/27/2016   Obstructive sleep apnea 09/15/2015   ONSET DATE: 2024  REFERRING DIAG:  Parkinson's Disease  THERAPY DIAG:  Muscle weakness (generalized)  Other lack of coordination  Rationale for Evaluation and Treatment: Rehabilitation  SUBJECTIVE:  SUBJECTIVE STATEMENT: Pt reports feeling better about unscheduled exercise and working these in to her daily routine or while watching a tv show. Pt accompanied by: self  PERTINENT HISTORY: Pt. is a 68 y.o. female who was is currently being followed by Dr. Maree in Neurology for Parkinson's Disease. PMHx includes: Tremors, RA, and Vertebral Fractures.  PRECAUTIONS: None  WEIGHT BEARING RESTRICTIONS: No  PAIN: 08/26/24: Pt reports no pain today Are you having pain? 2/10 pain in back at initial eval, however typicially 4/10  FALLS: Has patient fallen in last 6 months? No  LIVING ENVIRONMENT: Lives with: Alone; son  lives next door Lives in: House/apartment Stairs: Ramped entrance Has following equipment at home: Single point cane, Environmental consultant - 2 wheeled, Wheelchair (manual), Shower bench, bed side commode, and Grab bars  PLOF: Independent  PATIENT GOALS: To improve FMC   OBJECTIVE:  Note: Objective measures were completed at Evaluation unless otherwise noted.  HAND DOMINANCE: Right  ADLs: Overall ADLs:  Transfers/ambulation related to ADLs: Eating:  Independent, difficulty cutting steak Grooming:  Independent UB Dressing: Difficulty with fastening a bra. Fastens in front and is hard to turn it to the back, Difficulty buttoning LB Dressing: Independent with slide on shoes, elastic pants.  Toileting:  difficulty using right hand for posterior toilet hygiene care-uses modified  technique. Bathing:  Weakness with UE's noted when washing UEs, shaving legs is difficulty Tub Shower transfers: Modified independent  IADLs: Shopping: Independent Light housekeeping: Mopping is difficult  2/2 back pain Meal Prep: Independent heating items up, cooking eggs. Community mobility: Independent- increased awareness;  has slowed down Medication management: Unable to grasp pills from the tabletop surface. Is able to from her hand. Pillbox-2 weeks at a time Financial management: Transitioning to online accounts Handwriting: 50% legible, micrographia-smaller towards end of her name, has jaw tremor when writing.            Other: Pt. 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   MOBILITY STATUS: Independent  POSTURE COMMENTS:  No Significant postural limitations  ACTIVITY TOLERANCE: Activity tolerance: fatigues after 20 min. of activity  FUNCTIONAL OUTCOME MEASURES: MAM-20: 56/80  UPPER EXTREMITY ROM:    Active ROM Right Eval WFL Left Eval [facility]  Shoulder flexion    Shoulder abduction    Shoulder adduction    Shoulder extension    Shoulder internal rotation    Shoulder external rotation    Elbow flexion    Elbow extension    Wrist flexion    Wrist extension    Wrist ulnar deviation    Wrist radial deviation    Wrist pronation    Wrist supination    (Blank rows = not tested)  UPPER EXTREMITY MMT:     MMT Right eval Left Eval 5/5  Shoulder flexion 4/5   Shoulder abduction 4/5   Shoulder adduction    Shoulder extension    Shoulder internal rotation    Shoulder external rotation    Middle trapezius    Lower trapezius    Elbow flexion 5/5   Elbow extension 5/5   Wrist flexion 5/5   Wrist extension 5/5   Wrist ulnar deviation    Wrist radial deviation    Wrist pronation 5/5   Wrist supination 5/5   (Blank rows = not tested)  HAND FUNCTION: Grip strength:  Right: 35 lbs; Left: 47 lbs, Lateral pinch: Right: 13 lbs, Left: 12 lbs, and 3 point pinch: Right: 6 lbs, Left: 10 lbs  COORDINATION: 9 Hole Peg test: Right: 40 sec; Left: 24 sec  SENSATION: Light touch: WFL Proprioception: WFL  EDEMA:  N/A  MUSCLE TONE: Intact  COGNITION: Overall cognitive status: Within functional limits for tasks assessed  **Pt. Reports changes with word finding. Is interested in an ST referral following OT/PT**  VISION: Subjective report: Wears glasses. 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  Therapeutic Exercise: -HEP progression: Issued blue theraband for bicep curls and triceps strengthening, horiz abd; min vc for tension adjustment with good return demo   Neuro re-ed: HEP repeated above with visual feedback from mirror to improve BUE symmetry d/t RUE lifting short of shoulder level and slight R postural lateral lean; able to correct with visual feedback  PATIENT EDUCATION: Education details: HEP progression Person educated: Patient Education method: Programmer, multimedia, Demonstration, Verbal cues, and Handouts Education  comprehension: verbalized understanding, returned demonstration, verbal cues required, and tactile cues required  HOME EXERCISE PROGRAM: Pink theraputty, BUE strengthening: dumbbells/blue theraband  GOALS: Goals reviewed with patient? Yes  SHORT TERM GOALS: Target date: 09/24/2024  Pt. Will be independent with HEPs for the RUE, and hand strength, and coordination Baseline: Eval: No current HEP Goal status: INITIAL  LONG TERM GOALS: Target date: 11/05/2024  Pt. Will improve right shoulder strength by 2 mm grades to assist with ADLs, and IADLs. Baseline: Eval: Right shoulder flexion: 4/5, Abduction: 4/5. Goal status: INITIAL  2.  Pt. Will improve right grip strength by 5# to be able to securely hold, and pass weighted cups of soup. Baseline: Eval: Right: 35#, Left: 47# Goal status: INITIAL  3.  Pt. Will improve right hand pinch strength by 3# to be able to cut meat more efficiently Baseline: Eval: Lateral pinch: Right: 13 lbs, Left: 12 lbs, and 3 point pinch: Right: 6 lbs, Left: 10 lbs Goal status: INITIAL  4.  Pt. Will improve right hand [facility] skills in order to be able to grasp small items from a flat tabletop surface. Baseline: Eval: Right: 40 sec., Left: 24 sec. Goal status: INITIAL  5.  Pt. Will identify/demonstrate 3 compensatory strategies for tremors during ADL/IADLs, and when holding a hymn book in choir. Baseline: Eval: Education to be provided . Pt. Has difficulty stabilizing a hymn book in choir. Goal status: INITIAL  6.  Pt. Will write one  sentence with 100% legibility with consistent letter size throughout. Baseline: Eval: Name only: 50%,with micrographia 1/2 way through. Goal status: INITIAL  ASSESSMENT:  CLINICAL IMPRESSION: Pt demonstrates indep with theraputty exercises using writing handout and reports good adherence to HEP at home.  Pt requested review of her current BUE strengthening routine.  OT provided additions as noted above, with education on grading  exercises up/down with dumbbells or blue theraband.  Encouragement to perform exercises in front of mirror on occasion, as pt responded well to visual feedback to improve BUE symmetry after pt was observed with slight R postural lateral lean and RUE stopping short of shoulder level with each rep while LUE achieved 90* with flex/abd.  Able to self correct posture and BUE symmetry with visual feedback.  Pt verbalized understanding of all education and was receptive to environmental modification recommendations for church activities.  Pt. will continue to benefit from OT services to work on improving her  dominant RUE functioning in order to maximize overall independence with ADLs/IADL tasks, and to provide education on work simplification/activity modification, and compensatory strategies to manage tremors with daily tasks.     PERFORMANCE DEFICITS: in functional skills including , cognitive skills including , and psychosocial skills including coping strategies, environmental adaptation, interpersonal interactions, and routines and behaviors.   IMPAIRMENTS: are limiting patient from ADLs, IADLs, and leisure.   CO-MORBIDITIES: may have co-morbidities  that affects occupational performance. Patient will benefit from skilled OT to address above impairments and improve overall function.  MODIFICATION OR ASSISTANCE TO COMPLETE EVALUATION: Min-Moderate modification of tasks or assist with assess necessary to complete an evaluation.  OT OCCUPATIONAL PROFILE AND HISTORY: Detailed assessment: Review of records and additional review of physical, cognitive, psychosocial history related to current functional performance.  CLINICAL DECISION MAKING: Moderate - several treatment options, min-mod task modification necessary  REHAB POTENTIAL: Good  EVALUATION COMPLEXITY: Moderate    PLAN:  OT FREQUENCY: 2x/week  OT DURATION: 12 weeks  PLANNED INTERVENTIONS: 97535 self care/ADL training, 02889 therapeutic  exercise, 97530 therapeutic activity, 97112 neuromuscular re-education, 97140 manual therapy, 97018 paraffin, 02989 moist heat, 97010 cryotherapy, 97034 contrast bath, functional mobility training, energy conservation, coping strategies training, patient/family education, and DME and/or AE instructions  RECOMMENDED OTHER SERVICES: PT, ST  CONSULTED AND AGREED WITH PLAN OF CARE: Patient  PLAN FOR NEXT SESSION: Treatment  Inocente Blazing, MS, OTR/L   08/26/2024, 7:28 PM

## 2024-08-26 NOTE — Therapy (Signed)
 OUTPATIENT PHYSICAL THERAPY TREATMENT   Patient Name: Janice Rowe: [id number] DOB:03-18-1956, 68 y.o., female Today's Date: 08/26/2024   PCP: Fernande Ophelia JINNY DOUGLAS, MD REFERRING PROVIDER: Maree Jannett POUR, MD  END OF SESSION:   PT End of Session - 08/26/24 1532     Visit Number 3    Number of Visits 24    Date for Recertification  11/05/24    Authorization Type [employer]    Progress Note Due on Visit 10    PT Start Time 1530    PT Stop Time 1610    PT Time Calculation (min) 40 min    Equipment Utilized During Treatment Gait belt    Activity Tolerance Patient tolerated treatment well;No increased pain    Behavior During Therapy [facility] for tasks assessed/performed          Past Medical History:  Diagnosis Date   Acute pancreatitis 11/2008   Anemia    Arthritis    GERD (gastroesophageal reflux disease)    Hiatal hernia    Hypertension    Rheumatoid arthritis (HCC)    Sleep apnea    uses c-pap   Vitamin D  deficiency    Past Surgical History:  Procedure Laterality Date   CARPAL TUNNEL RELEASE Right 2013   COLONOSCOPY  2007   COLONOSCOPY WITH PROPOFOL  N/A 11/29/2016   Procedure: COLONOSCOPY WITH PROPOFOL ;  Surgeon: Reyes LELON Cota, MD;  Location: [facility] ENDOSCOPY;  Service: Endoscopy;  Laterality: N/A;   ESOPHAGOGASTRODUODENOSCOPY (EGD) WITH PROPOFOL  N/A 11/29/2016   Procedure: ESOPHAGOGASTRODUODENOSCOPY (EGD) WITH PROPOFOL ;  Surgeon: Reyes LELON Cota, MD;  Location: [facility] ENDOSCOPY;  Service: Endoscopy;  Laterality: N/A;   IR RADIOLOGIST EVAL & MGMT  05/29/2023   TOTAL HIP ARTHROPLASTY Right 02/18/2021   Procedure: TOTAL HIP ARTHROPLASTY ANTERIOR APPROACH;  Surgeon: Kathlynn Sharper, MD;  Location: [facility] ORS;  Service: Orthopedics;  Laterality: Right;   Patient Active Problem List   Diagnosis Date Noted   S/P hip replacement 02/18/2021   S/P total hip arthroplasty 02/18/2021   Anemia 10/04/2018   Essential hypertension 10/04/2018   Rheumatoid arthritis with rheumatoid  factor (HCC) 10/04/2018   Vitamin D  deficiency 10/04/2018   Lung nodule seen on imaging study 08/21/2017   Benign microscopic hematuria 02/06/2017   Cardiac murmur 11/08/2016   Dysphagia 10/11/2016   Encounter for screening colonoscopy 10/11/2016   Rectal bleeding 10/11/2016   Ocular migraine 08/02/2016   Recurrent major depressive disorder, in full remission 01/27/2016   Obstructive sleep apnea 09/15/2015    ONSET DATE: Dx w/ Parkinsons June 05, 2024  REFERRING DIAG: Tremors, gait changes, stiffness   THERAPY DIAG:  Muscle weakness (generalized)  Other lack of coordination  Unsteadiness on feet  Difficulty in walking, not elsewhere classified  Rationale for Evaluation and Treatment: Rehabilitation  SUBJECTIVE:  SUBJECTIVE STATEMENT: Pt doing find today. Arriveds post OT session. Boxing class still going ok.   Pt accompanied by: self  PERTINENT HISTORY:  Patient reports that she was diagnosed with Parkinsons June 05, 2024. Patient reporting that she enrolled in [facility] ~4 wks ago; reports tremors are predominantly in her hands & R leg. Pt with relevant PMH of RA & hx of compression fractures, so she would like to gain strength, learn how to safely exercise/be more active, improve endurance, & improve posture. Pt reports she also has had a R total hip replacement. Pt reports she sometimes does have trouble with stairs & has to rely on railing for support.   At baseline, pt reports completing STSs daily & walks 1-2 mi/day. Per neurologist note 08/05/24: Parkinsonism symptoms - Tremors, gait changes, stiffness Parkinson's disease with symptoms including tremors, bradykinesia, rigidity, and postural instability. Symptoms have been worsening, particularly in the right hand, with additional  tremors in the lips and legs at rest. There is also a decrease in fine motor skills, balance issues, and handwriting changes. The differential diagnosis includes essential tremor, but the clinical presentation is more consistent with Parkinson's disease. Patient reports good sense of smell. No active dreams. Constipation, patient takes prune juice. Handwriting getting smaller. Patient reports no problems with loud/soft voice. Difficulties with walking. Taking gabapentin , however slowly weaning off. Carbidopa-levodopa has improved tremors overall.    PAIN:  Are you having pain? No  PRECAUTIONS: Fall  RED FLAGS: None   WEIGHT BEARING RESTRICTIONS: No  FALLS: Has patient fallen in last 6 months? No; reports stumbles, near falls when pivoting, turning, etc.   LIVING ENVIRONMENT: Lives with: lives alone; son lives next door Lives in: House/apartment Stairs: No Has following equipment at home: Single point cane, Quad cane small base, Walker - 2 wheeled, Environmental consultant - 4 wheeled, Wheelchair (manual), Shower bench, Grab bars, and Ramped entry; pt states that she has sufficient equipment from husband who passed w/ PD  PLOF: Independent  PATIENT GOALS: get stronger, have more endurance, improve posture; learn how to exercise safely  OBJECTIVE:  Note: Objective measures were completed at Evaluation unless otherwise noted.                                                                                               TREATMENT DATE: 08/26/2024 -Yellow TB at knees 167ft AMB  -GreenTB at knees 150ft AMB, then 3x32ft forward eyes closed, 3x34ft backward eyes open -lateral shuffle with basketball rebounding, changing directions on cue, then forward and backward  -15lb ball toss, catch, lift and repeat x10, then 200ft AMB with 15lb ball carry  -50lb sand bag battle rope draw in 4x on airex, then 1x lRt, 1x left on airex  -5 sledge hammer swings on heavy bag ech side.   08/19/24 - -Mini BesTest -  Cable resisted walking with17.5lb forward and backward  - alternate 6 step taps x40 holding onto 15lb ball  - `80 degree turns xover rebounding and redTB at kneex x20    PATIENT EDUCATION: Education details: POC, findings of exam, HEP Person educated: Patient Education method:  Explanation Education comprehension: verbalized understanding and needs further education  HOME EXERCISE PROGRAM: Access Code: [email or web address] URL: [email or web address] Date: 08/13/2024 Prepared by: Chiquita Silvan  Exercises - Tandem Walking with Counter Support  - 1 x daily - 7 x weekly - 3 sets - 2-3 reps  GOALS: Goals reviewed with patient? Yes  SHORT TERM GOALS: Target date: 09/24/24  Patient will be independent with home exercise program to improve strength/mobility for increased functional independence with ADLs and mobility. Baseline: establish formal HEP at future visits Goal status: INITIAL  LONG TERM GOALS: Target date: 11/05/24  Patient will improve ABC scale score >80% to demonstrate increased confidence with functional mobility and ADLs.   Baseline: 75.625% Goal status: INITIAL  2.  Patient will improve 30sec STS score to 15x to demonstrate improved functional strength & endurance.  Baseline: 12x Goal status: INITIAL  3.  Patient will increase MiniBest Test score to >18/28 to indicate a reduced risk for falling and demonstrate increased independence with functional mobility and ADLs. Baseline: 23/28 Goal status: INITIAL  4.  Patient will increase six minute walk test distance to >1059ft for progression to community level ambulation, demonstrating improved gait endurance. Baseline: 08/19/24: 1359ft AMB;  Goal status: INITIAL  ASSESSMENT:  CLINICAL IMPRESSION:  Offered a variety of activities to stimulate balance control, proprioception, and high velocity and/or high power movements. Pt given rest breaks between efforts. Pt reports no exacerbation of pain areas during session,  will relay any soreness issues after the fact. The pt will benefit from further skilled PT to improve these deficits in order to increase QOL and ease/safety with ADLs.    OBJECTIVE IMPAIRMENTS: Abnormal gait, decreased activity tolerance, decreased balance, decreased endurance, difficulty walking, and decreased strength.   ACTIVITY LIMITATIONS: carrying, lifting, bending, stairs, and reach over head  PARTICIPATION LIMITATIONS: laundry, shopping, community activity, and yard work  PERSONAL FACTORS: Age, Sex, and 3+ comorbidities: RA, HTN, hx of compression fractures are also affecting patient's functional outcome.   REHAB POTENTIAL: Good CLINICAL DECISION MAKING: Evolving/moderate complexity EVALUATION COMPLEXITY: Moderate  PLAN:  PT FREQUENCY: 1-2x/week PT DURATION: 12 weeks PLANNED INTERVENTIONS: 97164- PT Re-evaluation, 97750- Physical Performance Testing, 97110-Therapeutic exercises, 97530- Therapeutic activity, 97112- Neuromuscular re-education, 97535- Self Care, 02859- Manual therapy, [phone]- Gait training, [phone]- Canalith repositioning, [phone] (1-2 muscles), 20561 (3+ muscles)- Dry Needling, Patient/Family education, Balance training, Stair training, Joint mobilization, Joint manipulation, Spinal manipulation, Spinal mobilization, Vestibular training, Cryotherapy, and Moist heat  PLAN FOR NEXT SESSION:   Doniel Maiello C, PT 08/26/2024, 3:33 PM  3:33 PM, 08/26/24 Peggye JAYSON Linear, PT, DPT Physical Therapist - [place] [facility]  Outpatient Physical Therapy- Main Campus [phone]

## 2024-08-28 ENCOUNTER — Ambulatory Visit: Attending: Neurology

## 2024-08-28 ENCOUNTER — Ambulatory Visit

## 2024-08-28 DIAGNOSIS — R2681 Unsteadiness on feet: Secondary | ICD-10-CM | POA: Insufficient documentation

## 2024-08-28 DIAGNOSIS — M5459 Other low back pain: Secondary | ICD-10-CM | POA: Diagnosis present

## 2024-08-28 DIAGNOSIS — R278 Other lack of coordination: Secondary | ICD-10-CM | POA: Diagnosis present

## 2024-08-28 DIAGNOSIS — M6281 Muscle weakness (generalized): Secondary | ICD-10-CM | POA: Insufficient documentation

## 2024-08-28 DIAGNOSIS — R251 Tremor, unspecified: Secondary | ICD-10-CM | POA: Diagnosis present

## 2024-08-28 DIAGNOSIS — R262 Difficulty in walking, not elsewhere classified: Secondary | ICD-10-CM

## 2024-08-28 DIAGNOSIS — R2689 Other abnormalities of gait and mobility: Secondary | ICD-10-CM | POA: Diagnosis present

## 2024-08-28 NOTE — Therapy (Signed)
 OUTPATIENT PHYSICAL THERAPY TREATMENT   Patient Name: Janice Rowe MRN: [id number] DOB:[birth year], 68 y.o., female Today's Date: 08/28/2024   PCP: Fernande Ophelia JINNY DOUGLAS, MD REFERRING PROVIDER: Maree Jannett POUR, MD  END OF SESSION:   PT End of Session - 08/28/24 1513     Visit Number 4    Number of Visits 24    Date for Recertification  11/05/24    Authorization Type [employer]    Progress Note Due on Visit 10    PT Start Time 1530    PT Stop Time 1610    PT Time Calculation (min) 40 min    Equipment Utilized During Treatment Gait belt    Activity Tolerance Patient tolerated treatment well;No increased pain;Patient limited by fatigue    Behavior During Therapy [facility] for tasks assessed/performed          Past Medical History:  Diagnosis Date   Acute pancreatitis 11/2008   Anemia    Arthritis    GERD (gastroesophageal reflux disease)    Hiatal hernia    Hypertension    Rheumatoid arthritis (HCC)    Sleep apnea    uses c-pap   Vitamin D  deficiency    Past Surgical History:  Procedure Laterality Date   CARPAL TUNNEL RELEASE Right 2013   COLONOSCOPY  2007   COLONOSCOPY WITH PROPOFOL  N/A 11/29/2016   Procedure: COLONOSCOPY WITH PROPOFOL ;  Surgeon: Reyes LELON Cota, MD;  Location: [facility] ENDOSCOPY;  Service: Endoscopy;  Laterality: N/A;   ESOPHAGOGASTRODUODENOSCOPY (EGD) WITH PROPOFOL  N/A 11/29/2016   Procedure: ESOPHAGOGASTRODUODENOSCOPY (EGD) WITH PROPOFOL ;  Surgeon: Reyes LELON Cota, MD;  Location: [facility] ENDOSCOPY;  Service: Endoscopy;  Laterality: N/A;   IR RADIOLOGIST EVAL & MGMT  05/29/2023   TOTAL HIP ARTHROPLASTY Right 02/18/2021   Procedure: TOTAL HIP ARTHROPLASTY ANTERIOR APPROACH;  Surgeon: Kathlynn Sharper, MD;  Location: [facility] ORS;  Service: Orthopedics;  Laterality: Right;   Patient Active Problem List   Diagnosis Date Noted   S/P hip replacement 02/18/2021   S/P total hip arthroplasty 02/18/2021   Anemia 10/04/2018   Essential hypertension 10/04/2018    Rheumatoid arthritis with rheumatoid factor (HCC) 10/04/2018   Vitamin D  deficiency 10/04/2018   Lung nodule seen on imaging study 08/21/2017   Benign microscopic hematuria 02/06/2017   Cardiac murmur 11/08/2016   Dysphagia 10/11/2016   Encounter for screening colonoscopy 10/11/2016   Rectal bleeding 10/11/2016   Ocular migraine 08/02/2016   Recurrent major depressive disorder, in full remission 01/27/2016   Obstructive sleep apnea 09/15/2015    ONSET DATE: Dx w/ Parkinsons June 05, 2024  REFERRING DIAG: Tremors, gait changes, stiffness   THERAPY DIAG:  Muscle weakness (generalized)  Other lack of coordination  Unsteadiness on feet  Difficulty in walking, not elsewhere classified  Rationale for Evaluation and Treatment: Rehabilitation  SUBJECTIVE:  SUBJECTIVE STATEMENT: Pt doing find today. Arriveds post OT session. Boxing class still going ok.   Pt accompanied by: self  PERTINENT HISTORY:  Patient reports that she was diagnosed with Parkinsons June 05, 2024. Patient reporting that she enrolled in [facility] Boxing ~4 wks ago; reports tremors are predominantly in her hands & R leg. Pt with relevant PMH of RA & hx of compression fractures, so she would like to gain strength, learn how to safely exercise/be more active, improve endurance, & improve posture. Pt reports she also has had a R total hip replacement. Pt reports she sometimes does have trouble with stairs & has to rely on railing for support.   At baseline, pt reports completing STSs daily & walks 1-2 mi/day. Per neurologist note 08/05/24: Parkinsonism symptoms - Tremors, gait changes, stiffness Parkinson's disease with symptoms including tremors, bradykinesia, rigidity, and postural instability. Symptoms have been worsening,  particularly in the right hand, with additional tremors in the lips and legs at rest. There is also a decrease in fine motor skills, balance issues, and handwriting changes. The differential diagnosis includes essential tremor, but the clinical presentation is more consistent with Parkinson's disease. Patient reports good sense of smell. No active dreams. Constipation, patient takes prune juice. Handwriting getting smaller. Patient reports no problems with loud/soft voice. Difficulties with walking. Taking gabapentin , however slowly weaning off. Carbidopa-levodopa has improved tremors overall.    PAIN:  Are you having pain? No  PRECAUTIONS: Fall  RED FLAGS: None   WEIGHT BEARING RESTRICTIONS: No  FALLS: Has patient fallen in last 6 months? No; reports stumbles, near falls when pivoting, turning, etc.   LIVING ENVIRONMENT: Lives with: lives alone; son lives next door Lives in: House/apartment Stairs: No Has following equipment at home: Single point cane, Quad cane small base, Walker - 2 wheeled, Environmental consultant - 4 wheeled, Wheelchair (manual), Shower bench, Grab bars, and Ramped entry; pt states that she has sufficient equipment from husband who passed w/ PD  PLOF: Independent  PATIENT GOALS: get stronger, have more endurance, improve posture; learn how to exercise safely  OBJECTIVE:  Note: Objective measures were completed at Evaluation unless otherwise noted.                                                                                               TREATMENT DATE: 08/28/2024 - GreenTB at knees, 2lb AW: 175ft AMB, then 3x55ft forward eyes closed, 3x42ft backward eyes open - 2 laps on LL with 2lb AW bilat, GTB, and adding up 2 playing card x1 lap, then 3 cards for 2nd lap (moving pretty fast) - cable resisted walking over firm ground, soft surfaces, and trampoline: 4 directions 22.5lb Fwd/backward, then 17.5lb  - chair sled HIT: 60lb weights x24ft (max effort) twice at 16 and 14 sec, then  70lb twice at 13 and 12sec  - 12 step taps while on airex pad x40 c 2lb AW bilat    08/26/24 -Yellow TB at knees 169ft AMB  -GreenTB at knees 150ft AMB, then 3x72ft forward eyes closed, 3x31ft backward eyes open -lateral shuffle with basketball rebounding, changing directions on cue,  then forward and backward  -15lb ball toss, catch, lift and repeat x10, then 228ft AMB with 15lb ball carry  -50lb sand bag battle rope draw in 4x on airex, then 1x lRt, 1x left on airex  -5 sledge hammer swings on heavy bag ech side.   08/19/24 - -Mini BesTest - Cable resisted walking with17.5lb forward and backward  - alternate 6 step taps x40 holding onto 15lb ball  - `80 degree turns xover rebounding and redTB at kneex x20    PATIENT EDUCATION: Education details: POC, findings of exam, HEP Person educated: Patient Education method: Explanation Education comprehension: verbalized understanding and needs further education  HOME EXERCISE PROGRAM: Access Code: [email or web address] URL: [email or web address] Date: 08/13/2024 Prepared by: Chiquita Silvan  Exercises - Tandem Walking with Counter Support  - 1 x daily - 7 x weekly - 3 sets - 2-3 reps  GOALS: Goals reviewed with patient? Yes  SHORT TERM GOALS: Target date: 09/24/24  Patient will be independent with home exercise program to improve strength/mobility for increased functional independence with ADLs and mobility. Baseline: establish formal HEP at future visits Goal status: INITIAL  LONG TERM GOALS: Target date: 11/05/24  Patient will improve ABC scale score >80% to demonstrate increased confidence with functional mobility and ADLs.   Baseline: 75.625% Goal status: INITIAL  2.  Patient will improve 30sec STS score to 15x to demonstrate improved functional strength & endurance.  Baseline: 12x Goal status: INITIAL  3.  Patient will increase MiniBest Test score to >18/28 to indicate a reduced risk for falling and demonstrate  increased independence with functional mobility and ADLs. Baseline: 23/28 Goal status: INITIAL  4.  Patient will increase six minute walk test distance to >1034ft for progression to community level ambulation, demonstrating improved gait endurance. Baseline: 08/19/24: 1361ft AMB;  Goal status: INITIAL  ASSESSMENT:  CLINICAL IMPRESSION:  Continued with a diverse variety of activities to stimulate balance control, proprioception, and high velocity and/or high power movements. Pt continues to be quite engaged with activities offering her best effort. Pt given rest breaks between efforts. Pt reports no exacerbation of pain areas during session, will relay any soreness issues after the fact. The pt will benefit from further skilled PT to improve these deficits in order to increase QOL and ease/safety with ADLs.   OBJECTIVE IMPAIRMENTS: Abnormal gait, decreased activity tolerance, decreased balance, decreased endurance, difficulty walking, and decreased strength.  ACTIVITY LIMITATIONS: carrying, lifting, bending, stairs, and reach over head PARTICIPATION LIMITATIONS: laundry, shopping, community activity, and yard work PERSONAL FACTORS: Age, Sex, and 3+ comorbidities: RA, HTN, hx of compression fractures are also affecting patient's functional outcome.  REHAB POTENTIAL: Good CLINICAL DECISION MAKING: Evolving/moderate complexity EVALUATION COMPLEXITY: Moderate  PLAN:  PT FREQUENCY: 1-2x/week PT DURATION: 12 weeks PLANNED INTERVENTIONS: 97164- PT Re-evaluation, 97750- Physical Performance Testing, 97110-Therapeutic exercises, 97530- Therapeutic activity, 97112- Neuromuscular re-education, 97535- Self Care, 02859- Manual therapy, [phone]- Gait training, [phone]- Canalith repositioning, [phone] (1-2 muscles), 20561 (3+ muscles)- Dry Needling, Patient/Family education, Balance training, Stair training, Joint mobilization, Joint manipulation, Spinal manipulation, Spinal mobilization, Vestibular training,  Cryotherapy, and Moist heat  PLAN FOR NEXT SESSION:   Chrisie Jankovich C, PT 08/28/2024, 3:20 PM  3:20 PM, 08/28/24 Peggye JAYSON Linear, PT, DPT Physical Therapist - [facility]  Outpatient Physical Therapy- Main Campus [phone]

## 2024-08-29 NOTE — Therapy (Signed)
 OUTPATIENT OCCUPATIONAL THERAPY NEURO TREATMENT NOTE  Patient Name: Janice Rowe MRN: [id number] DOB:Oct 31, 1956, 68 y.o., female Today's Date: 08/29/2024  PCP: Fernande Ophelia PARAS. III, MD REFERRING PROVIDER: Maree Hila, MARLA, MD  END OF SESSION:  OT End of Session - 08/29/24 1555     Visit Number 4    Number of Visits 24    Date for Recertification  11/05/24    OT Start Time 1445    OT Stop Time 1530    OT Time Calculation (min) 45 min    Activity Tolerance Patient tolerated treatment well    Behavior During Therapy [facility] for tasks assessed/performed         Past Medical History:  Diagnosis Date   Acute pancreatitis 11/2008   Anemia    Arthritis    GERD (gastroesophageal reflux disease)    Hiatal hernia    Hypertension    Rheumatoid arthritis (HCC)    Sleep apnea    uses c-pap   Vitamin D  deficiency    Past Surgical History:  Procedure Laterality Date   CARPAL TUNNEL RELEASE Right 2013   COLONOSCOPY  2007   COLONOSCOPY WITH PROPOFOL  N/A 11/29/2016   Procedure: COLONOSCOPY WITH PROPOFOL ;  Surgeon: Reyes LELON Cota, MD;  Location: [facility] ENDOSCOPY;  Service: Endoscopy;  Laterality: N/A;   ESOPHAGOGASTRODUODENOSCOPY (EGD) WITH PROPOFOL  N/A 11/29/2016   Procedure: ESOPHAGOGASTRODUODENOSCOPY (EGD) WITH PROPOFOL ;  Surgeon: Reyes LELON Cota, MD;  Location: [facility] ENDOSCOPY;  Service: Endoscopy;  Laterality: N/A;   IR RADIOLOGIST EVAL & MGMT  05/29/2023   TOTAL HIP ARTHROPLASTY Right 02/18/2021   Procedure: TOTAL HIP ARTHROPLASTY ANTERIOR APPROACH;  Surgeon: Kathlynn Sharper, MD;  Location: [facility] ORS;  Service: Orthopedics;  Laterality: Right;   Patient Active Problem List   Diagnosis Date Noted   S/P hip replacement 02/18/2021   S/P total hip arthroplasty 02/18/2021   Anemia 10/04/2018   Essential hypertension 10/04/2018   Rheumatoid arthritis with rheumatoid factor (HCC) 10/04/2018   Vitamin D  deficiency 10/04/2018   Lung nodule seen on imaging study 08/21/2017   Benign microscopic  hematuria 02/06/2017   Cardiac murmur 11/08/2016   Dysphagia 10/11/2016   Encounter for screening colonoscopy 10/11/2016   Rectal bleeding 10/11/2016   Ocular migraine 08/02/2016   Recurrent major depressive disorder, in full remission 01/27/2016   Obstructive sleep apnea 09/15/2015   ONSET DATE: 2024  REFERRING DIAG:  Parkinson's Disease  THERAPY DIAG:  Muscle weakness (generalized)  Other lack of coordination  Tremor  Rationale for Evaluation and Treatment: Rehabilitation  SUBJECTIVE:  SUBJECTIVE STATEMENT: Pt reports using a mirror for visual feedback during UB exercises as encouraged by OT.  Pt accompanied by: self  PERTINENT HISTORY: Pt. is a 68 y.o. female who was is currently being followed by Dr. Maree in Neurology for Parkinson's Disease. PMHx includes: Tremors, RA, and Vertebral Fractures.  PRECAUTIONS: None  WEIGHT BEARING RESTRICTIONS: No  PAIN: 08/28/24: Pt reports no pain today Are you having pain? 2/10 pain in back at initial eval, however typicially 4/10  FALLS: Has patient fallen in last 6 months? No  LIVING ENVIRONMENT: Lives with: Alone; son  lives next door Lives in: House/apartment Stairs: Ramped entrance Has following equipment at home: Single point cane, Environmental consultant - 2 wheeled, Wheelchair (manual), Shower bench, bed side commode, and Grab bars  PLOF: Independent  PATIENT GOALS: To improve FMC   OBJECTIVE:  Note: Objective measures were completed at Evaluation unless otherwise noted.  HAND DOMINANCE: Right  ADLs: Overall ADLs:  Transfers/ambulation related  to ADLs: Eating:  Independent, difficulty cutting steak Grooming:  Independent UB Dressing: Difficulty with fastening a bra. Fastens in front and is hard to turn it to the back, Difficulty buttoning LB Dressing: Independent with slide on shoes, elastic pants.  Toileting:  difficulty using right hand for posterior toilet hygiene care-uses modified technique. Bathing:  Weakness with UE's  noted when washing UEs, shaving legs is difficulty Tub Shower transfers: Modified independent  IADLs: Shopping: Independent Light housekeeping: Mopping is difficult  2/2 back pain Meal Prep: Independent heating items up, cooking eggs. Community mobility: Independent- increased awareness;  has slowed down Medication management: Unable to grasp pills from the tabletop surface. Is able to from her hand. Pillbox-2 weeks at a time Financial management: Transitioning to online accounts Handwriting: 50% legible, micrographia-smaller towards end of her name, has jaw tremor when writing.            Other: Pt. 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   MOBILITY STATUS: Independent  POSTURE COMMENTS:  No Significant postural limitations  ACTIVITY TOLERANCE: Activity tolerance: fatigues after 20 min. of activity  FUNCTIONAL OUTCOME MEASURES: MAM-20: 56/80  UPPER EXTREMITY ROM:    Active ROM Right Eval WFL Left Eval [facility]  Shoulder flexion    Shoulder abduction    Shoulder adduction    Shoulder extension    Shoulder internal rotation    Shoulder external rotation    Elbow flexion    Elbow extension    Wrist flexion    Wrist extension    Wrist ulnar deviation    Wrist radial deviation    Wrist pronation    Wrist supination    (Blank rows = not tested)  UPPER EXTREMITY MMT:     MMT Right eval Left Eval 5/5  Shoulder flexion 4/5   Shoulder abduction 4/5   Shoulder adduction    Shoulder extension    Shoulder internal rotation    Shoulder external rotation    Middle trapezius    Lower trapezius    Elbow flexion 5/5   Elbow extension 5/5   Wrist flexion 5/5   Wrist extension 5/5   Wrist ulnar deviation    Wrist radial deviation    Wrist pronation 5/5   Wrist supination 5/5   (Blank rows = not tested)  HAND FUNCTION: Grip strength: Right: 35 lbs; Left: 47 lbs, Lateral pinch:  Right: 13 lbs, Left: 12 lbs, and 3 point pinch: Right: 6 lbs, Left: 10 lbs  COORDINATION: 9 Hole Peg test: Right: 40 sec; Left: 24 sec  SENSATION: Light touch: WFL Proprioception: WFL  EDEMA:  N/A  MUSCLE TONE: Intact  COGNITION: Overall cognitive status: Within functional limits for tasks assessed  **Pt. Reports changes with word finding. Is interested in an ST referral following OT/PT**  VISION: Subjective report: Wears glasses. Upgraded prescription 3-4 months ago  PERCEPTION: WFL  PRAXIS: [facility]  TREATMENT DATE: 08/28/24  Neuro re-ed: Facilitated R/L coordination skills with manipulation of [place]  discs:  Timed trials for rotating 20 individual discs black>red side to place in grid, with immediate removal of discs from grid with rotation from red>black side. R hand: Trial 1: 1 min 36 sec, Trial 2: 1 min 18 sec, Trial 3: 1 min 5 sec  L hand: Trial 1: 1 min 2 sec Practiced increasing speed with RUE reaching to position discs between R and L sides of grid when switching between R and L hand trials; vc to increase pace/maintain speed  Therapeutic Exercise: Bilat grip strengthening:  R: hand gripper set at 17.9# for 1 trial, progressing to 23.4# for 2 more trials to remove jumbo pegs from pegboard.  L: hand gripper set at 23.4# for 1 trial  Self Care: -Condition management: possible benefits of light resistance at the wrist/forearm to minimize RUE tremor, with brief trial of 1# and 1/2# wrist weights to place jumbo pegs into pegboard for hand gripper activity noted above. Advised on use of wrist weights vs weighted arm sleeve, and options to obtain.  -Advised on GMC/FMC activities for HEP to focus on coordination speed (flipping, dealing, sorting cards, picking up coins and placing into container, using timer to track progress as desired).  PATIENT  EDUCATION: Education details: HEP progression Person educated: Patient Education method: Explanation Education comprehension: verbalized understanding and returned demonstration  HOME EXERCISE PROGRAM: Pink theraputty, BUE strengthening: dumbbells/blue theraband  GOALS: Goals reviewed with patient? Yes  SHORT TERM GOALS: Target date: 09/24/2024  Pt. Will be independent with HEPs for the RUE, and hand strength, and coordination Baseline: Eval: No current HEP Goal status: INITIAL  LONG TERM GOALS: Target date: 11/05/2024  Pt. Will improve right shoulder strength by 2 mm grades to assist with ADLs, and IADLs. Baseline: Eval: Right shoulder flexion: 4/5, Abduction: 4/5. Goal status: INITIAL  2.  Pt. Will improve right grip strength by 5# to be able to securely hold, and pass weighted cups of soup. Baseline: Eval: Right: 35#, Left: 47# Goal status: INITIAL  3.  Pt. Will improve right hand pinch strength by 3# to be able to cut meat more efficiently Baseline: Eval: Lateral pinch: Right: 13 lbs, Left: 12 lbs, and 3 point pinch: Right: 6 lbs, Left: 10 lbs Goal status: INITIAL  4.  Pt. Will improve right hand [facility] skills in order to be able to grasp small items from a flat tabletop surface. Baseline: Eval: Right: 40 sec., Left: 24 sec. Goal status: INITIAL  5.  Pt. Will identify/demonstrate 3 compensatory strategies for tremors during ADL/IADLs, and when holding a hymn book in choir. Baseline: Eval: Education to be provided . Pt. Has difficulty stabilizing a hymn book in choir. Goal status: INITIAL  6.  Pt. Will write one  sentence with 100% legibility with consistent letter size throughout. Baseline: Eval: Name only: 50%,with micrographia 1/2 way through. Goal status: INITIAL  ASSESSMENT:  CLINICAL IMPRESSION: Pt noted reduction in R hand tremor with use of both 1# and 1/2# wrist weights today, though pt verbalized that 1# felt heavy.  Pt acknowledged bradykinesia with RUE  activity and inquired how to target improvements in speed.  Pt receptive to activity recommendations to target speed when engaging in GMC/FMC activities.  Pt was able to increase speed on all 3 trials with [place]  discs using RUE, benefiting from intermittent vc for increasing and then maintaining speed.  Pt. will continue to benefit from OT services to work on improving  her dominant RUE functioning in order to maximize overall independence with ADLs/IADL tasks, and to provide education on work simplification/activity modification, and compensatory strategies to manage tremors with daily tasks.     PERFORMANCE DEFICITS: in functional skills including , cognitive skills including , and psychosocial skills including coping strategies, environmental adaptation, interpersonal interactions, and routines and behaviors.   IMPAIRMENTS: are limiting patient from ADLs, IADLs, and leisure.   CO-MORBIDITIES: may have co-morbidities  that affects occupational performance. Patient will benefit from skilled OT to address above impairments and improve overall function.  MODIFICATION OR ASSISTANCE TO COMPLETE EVALUATION: Min-Moderate modification of tasks or assist with assess necessary to complete an evaluation.  OT OCCUPATIONAL PROFILE AND HISTORY: Detailed assessment: Review of records and additional review of physical, cognitive, psychosocial history related to current functional performance.  CLINICAL DECISION MAKING: Moderate - several treatment options, min-mod task modification necessary  REHAB POTENTIAL: Good  EVALUATION COMPLEXITY: Moderate    PLAN:  OT FREQUENCY: 2x/week  OT DURATION: 12 weeks  PLANNED INTERVENTIONS: 97535 self care/ADL training, 02889 therapeutic exercise, 97530 therapeutic activity, 97112 neuromuscular re-education, 97140 manual therapy, 97018 paraffin, 02989 moist heat, 97010 cryotherapy, 97034 contrast bath, functional mobility training, energy conservation, coping  strategies training, patient/family education, and DME and/or AE instructions  RECOMMENDED OTHER SERVICES: PT, ST  CONSULTED AND AGREED WITH PLAN OF CARE: Patient  PLAN FOR NEXT SESSION: Treatment  Inocente Blazing, MS, OTR/L  08/29/2024, 3:57 PM

## 2024-09-02 ENCOUNTER — Ambulatory Visit

## 2024-09-02 DIAGNOSIS — R2681 Unsteadiness on feet: Secondary | ICD-10-CM

## 2024-09-02 DIAGNOSIS — R278 Other lack of coordination: Secondary | ICD-10-CM

## 2024-09-02 DIAGNOSIS — R262 Difficulty in walking, not elsewhere classified: Secondary | ICD-10-CM

## 2024-09-02 DIAGNOSIS — R251 Tremor, unspecified: Secondary | ICD-10-CM

## 2024-09-02 DIAGNOSIS — M6281 Muscle weakness (generalized): Secondary | ICD-10-CM

## 2024-09-02 NOTE — Therapy (Signed)
 OUTPATIENT PHYSICAL THERAPY TREATMENT   Patient Name: Janice Rowe MRN: [id number] DOB:01-18-1956, 68 y.o., female Today's Date: 09/02/2024   PCP: Janice Ophelia JINNY DOUGLAS, MD REFERRING PROVIDER: Maree Jannett POUR, MD  END OF SESSION:   PT End of Session - 09/02/24 1450     Visit Number 5    Number of Visits 24    Date for Recertification  11/05/24    Authorization Type [employer]    Progress Note Due on Visit 10    PT Start Time 1447    PT Stop Time 1527    PT Time Calculation (min) 40 min    Activity Tolerance Patient tolerated treatment well;No increased pain;Patient limited by fatigue    Behavior During Therapy [facility] for tasks assessed/performed          Past Medical History:  Diagnosis Date   Acute pancreatitis 11/2008   Anemia    Arthritis    GERD (gastroesophageal reflux disease)    Hiatal hernia    Hypertension    Rheumatoid arthritis (HCC)    Sleep apnea    uses c-pap   Vitamin D  deficiency    Past Surgical History:  Procedure Laterality Date   CARPAL TUNNEL RELEASE Right 2013   COLONOSCOPY  2007   COLONOSCOPY WITH PROPOFOL  N/A 11/29/2016   Procedure: COLONOSCOPY WITH PROPOFOL ;  Surgeon: Janice LELON Cota, MD;  Location: [facility] ENDOSCOPY;  Service: Endoscopy;  Laterality: N/A;   ESOPHAGOGASTRODUODENOSCOPY (EGD) WITH PROPOFOL  N/A 11/29/2016   Procedure: ESOPHAGOGASTRODUODENOSCOPY (EGD) WITH PROPOFOL ;  Surgeon: Janice LELON Cota, MD;  Location: [facility] ENDOSCOPY;  Service: Endoscopy;  Laterality: N/A;   IR RADIOLOGIST EVAL & MGMT  05/29/2023   TOTAL HIP ARTHROPLASTY Right 02/18/2021   Procedure: TOTAL HIP ARTHROPLASTY ANTERIOR APPROACH;  Surgeon: Janice Sharper, MD;  Location: [facility] ORS;  Service: Orthopedics;  Laterality: Right;   Patient Active Problem List   Diagnosis Date Noted   S/P hip replacement 02/18/2021   S/P total hip arthroplasty 02/18/2021   Anemia 10/04/2018   Essential hypertension 10/04/2018   Rheumatoid arthritis with rheumatoid factor (HCC)  10/04/2018   Vitamin D  deficiency 10/04/2018   Lung nodule seen on imaging study 08/21/2017   Benign microscopic hematuria 02/06/2017   Cardiac murmur 11/08/2016   Dysphagia 10/11/2016   Encounter for screening colonoscopy 10/11/2016   Rectal bleeding 10/11/2016   Ocular migraine 08/02/2016   Recurrent major depressive disorder, in full remission 01/27/2016   Obstructive sleep apnea 09/15/2015    ONSET DATE: Dx w/ Parkinsons June 05, 2024  REFERRING DIAG: Tremors, gait changes, stiffness   THERAPY DIAG:  Muscle weakness (generalized)  Other lack of coordination  Tremor  Unsteadiness on feet  Difficulty in walking, not elsewhere classified  Rationale for Evaluation and Treatment: Rehabilitation  SUBJECTIVE:  SUBJECTIVE STATEMENT: Pt doing find today. Arriveds post OT session. Pt did some pressure washing over the weekend and was a little sore thereafter.   Pt accompanied by: self  PERTINENT HISTORY:  Patient reports that she was diagnosed with Parkinsons June 05, 2024. Patient reporting that she enrolled in [facility] ~4 wks ago; reports tremors are predominantly in her hands & R leg. Pt with relevant PMH of RA & hx of compression fractures, so she would like to gain strength, learn how to safely exercise/be more active, improve endurance, & improve posture. Pt reports she also has had a R total hip replacement. Pt reports she sometimes does have trouble with stairs & has to rely on railing for support.   At baseline, pt reports completing STSs daily & walks 1-2 mi/day. Per neurologist note 08/05/24: Parkinsonism symptoms - Tremors, gait changes, stiffness Parkinson's disease with symptoms including tremors, bradykinesia, rigidity, and postural instability. Symptoms have been worsening,  particularly in the right hand, with additional tremors in the lips and legs at rest. There is also a decrease in fine motor skills, balance issues, and handwriting changes. The differential diagnosis includes essential tremor, but the clinical presentation is more consistent with Parkinson's disease. Patient reports good sense of smell. No active dreams. Constipation, patient takes prune juice. Handwriting getting smaller. Patient reports no problems with loud/soft voice. Difficulties with walking. Taking gabapentin , however slowly weaning off. Carbidopa-levodopa has improved tremors overall.    PAIN:  Are you having pain? No  PRECAUTIONS: Fall  RED FLAGS: None   WEIGHT BEARING RESTRICTIONS: No  FALLS: Has patient fallen in last 6 months? No; reports stumbles, near falls when pivoting, turning, etc.   LIVING ENVIRONMENT: Lives with: lives alone; son lives next door Lives in: House/apartment Stairs: No Has following equipment at home: Single point cane, Quad cane small base, Walker - 2 wheeled, Environmental consultant - 4 wheeled, Wheelchair (manual), Shower bench, Grab bars, and Ramped entry; pt states that she has sufficient equipment from husband who passed w/ PD  PLOF: Independent  PATIENT GOALS: get stronger, have more endurance, improve posture; learn how to exercise safely  OBJECTIVE:  Note: Objective measures were completed at Evaluation unless otherwise noted.                                                                                               TREATMENT DATE: 09/02/2024 -cable resisted AMB over red mat with objects below, trampoline, 4 step  17.5lb lateral resistance, 22.5lb forward resistance -overground AMB, 2lb AW GTB at thigh: 487ft while adding 3 random playing cards at a time; then 445ft while ball tossing/catching with forward or backward walking -tandem stance red phys ball toss/catch 10x each time (  -airex pad rotation + red physio ball toss 12x each side  -red physio  ball toss and catch to author over 19ft x12 -cable backward walk 33.5lb, baseball grip pull 4x72ft   PATIENT EDUCATION: Education details: POC, findings of exam, HEP Person educated: Patient Education method: Explanation Education comprehension: verbalized understanding and needs further education  HOME EXERCISE PROGRAM: Access Code: [email or web address] URL: [email or web address] Date:  08/13/2024 Prepared by: Chiquita Silvan  Exercises - Tandem Walking with Counter Support  - 1 x daily - 7 x weekly - 3 sets - 2-3 reps  GOALS: Goals reviewed with patient? Yes  SHORT TERM GOALS: Target date: 09/24/24  Patient will be independent with home exercise program to improve strength/mobility for increased functional independence with ADLs and mobility. Baseline: establish formal HEP at future visits Goal status: INITIAL  LONG TERM GOALS: Target date: 11/05/24  Patient will improve ABC scale score >80% to demonstrate increased confidence with functional mobility and ADLs.   Baseline: 75.625% Goal status: INITIAL  2.  Patient will improve 30sec STS score to 15x to demonstrate improved functional strength & endurance.  Baseline: 12x Goal status: INITIAL  3.  Patient will increase MiniBest Test score to >18/28 to indicate a reduced risk for falling and demonstrate increased independence with functional mobility and ADLs. Baseline: 23/28 Goal status: INITIAL  4.  Patient will increase six minute walk test distance to >109ft for progression to community level ambulation, demonstrating improved gait endurance. Baseline: 08/19/24: 138ft AMB;  Goal status: INITIAL  ASSESSMENT:  CLINICAL IMPRESSION:  Continued with a diverse variety of activities to stimulate balance control, proprioception, and high velocity and/or high power movements. Moved cable resisted walking to beginning of session today as it was the final actiivty last time and quite difficult to perform in a fatigued state.  Pt continues to be quite engaged with activities offering her best effort. Pt given rest breaks between efforts. Pt reports no exacerbation of pain areas during session, will relay any soreness issues after the fact. The pt will benefit from further skilled PT to improve these deficits in order to increase QOL and ease/safety with ADLs.   OBJECTIVE IMPAIRMENTS: Abnormal gait, decreased activity tolerance, decreased balance, decreased endurance, difficulty walking, and decreased strength.  ACTIVITY LIMITATIONS: carrying, lifting, bending, stairs, and reach over head PARTICIPATION LIMITATIONS: laundry, shopping, community activity, and yard work PERSONAL FACTORS: Age, Sex, and 3+ comorbidities: RA, HTN, hx of compression fractures are also affecting patient's functional outcome.  REHAB POTENTIAL: Good CLINICAL DECISION MAKING: Evolving/moderate complexity EVALUATION COMPLEXITY: Moderate  PLAN:  PT FREQUENCY: 1-2x/week PT DURATION: 12 weeks PLANNED INTERVENTIONS: 97164- PT Re-evaluation, 97750- Physical Performance Testing, 97110-Therapeutic exercises, 97530- Therapeutic activity, 97112- Neuromuscular re-education, 97535- Self Care, 02859- Manual therapy, [phone]- Gait training, [phone]- Canalith repositioning, [phone] (1-2 muscles), 20561 (3+ muscles)- Dry Needling, Patient/Family education, Balance training, Stair training, Joint mobilization, Joint manipulation, Spinal manipulation, Spinal mobilization, Vestibular training, Cryotherapy, and Moist heat  PLAN FOR NEXT SESSION:   Teigen Bellin C, PT 09/02/2024, 2:53 PM  2:53 PM, 09/02/24 Peggye JAYSON Linear, PT, DPT Physical Therapist - [facility]  Outpatient Physical Therapy- Main Campus [phone]

## 2024-09-02 NOTE — Therapy (Unsigned)
 OUTPATIENT OCCUPATIONAL THERAPY NEURO TREATMENT NOTE  Patient Name: Janice Rowe MRN: [id number] DOB:Sep 01, 1956, 68 y.o., female Today's Date: 09/03/2024  PCP: Fernande Ophelia PARAS. III, MD REFERRING PROVIDER: Maree Hila, MARLA, MD  END OF SESSION:  OT End of Session - 09/02/24 1427     Visit Number 5    Number of Visits 24    Date for Recertification  11/05/24    OT Start Time 1400    OT Stop Time 1445    OT Time Calculation (min) 45 min    Activity Tolerance Patient tolerated treatment well    Behavior During Therapy [facility] for tasks assessed/performed          Past Medical History:  Diagnosis Date   Acute pancreatitis 11/2008   Anemia    Arthritis    GERD (gastroesophageal reflux disease)    Hiatal hernia    Hypertension    Rheumatoid arthritis (HCC)    Sleep apnea    uses c-pap   Vitamin D  deficiency    Past Surgical History:  Procedure Laterality Date   CARPAL TUNNEL RELEASE Right 2013   COLONOSCOPY  2007   COLONOSCOPY WITH PROPOFOL  N/A 11/29/2016   Procedure: COLONOSCOPY WITH PROPOFOL ;  Surgeon: Reyes LELON Cota, MD;  Location: [facility] ENDOSCOPY;  Service: Endoscopy;  Laterality: N/A;   ESOPHAGOGASTRODUODENOSCOPY (EGD) WITH PROPOFOL  N/A 11/29/2016   Procedure: ESOPHAGOGASTRODUODENOSCOPY (EGD) WITH PROPOFOL ;  Surgeon: Reyes LELON Cota, MD;  Location: [facility] ENDOSCOPY;  Service: Endoscopy;  Laterality: N/A;   IR RADIOLOGIST EVAL & MGMT  05/29/2023   TOTAL HIP ARTHROPLASTY Right 02/18/2021   Procedure: TOTAL HIP ARTHROPLASTY ANTERIOR APPROACH;  Surgeon: Kathlynn Sharper, MD;  Location: [facility] ORS;  Service: Orthopedics;  Laterality: Right;   Patient Active Problem List   Diagnosis Date Noted   S/P hip replacement 02/18/2021   S/P total hip arthroplasty 02/18/2021   Anemia 10/04/2018   Essential hypertension 10/04/2018   Rheumatoid arthritis with rheumatoid factor (HCC) 10/04/2018   Vitamin D  deficiency 10/04/2018   Lung nodule seen on imaging study 08/21/2017   Benign microscopic  hematuria 02/06/2017   Cardiac murmur 11/08/2016   Dysphagia 10/11/2016   Encounter for screening colonoscopy 10/11/2016   Rectal bleeding 10/11/2016   Ocular migraine 08/02/2016   Recurrent major depressive disorder, in full remission 01/27/2016   Obstructive sleep apnea 09/15/2015   ONSET DATE: 2024  REFERRING DIAG:  Parkinson's Disease  THERAPY DIAG:  Muscle weakness (generalized)  Other lack of coordination  Tremor  Rationale for Evaluation and Treatment: Rehabilitation  SUBJECTIVE:  SUBJECTIVE STATEMENT: Pt reports working on flipping/sorting cards at home to target RUE motor planning/speed as recommended by OT. Pt accompanied by: self  PERTINENT HISTORY: Pt. is a 68 y.o. female who was is currently being followed by Dr. Maree in Neurology for Parkinson's Disease. PMHx includes: Tremors, RA, and Vertebral Fractures.  PRECAUTIONS: None  WEIGHT BEARING RESTRICTIONS: No  PAIN: 09/02/24: Pt reports no pain today Are you having pain? 2/10 pain in back at initial eval, however typicially 4/10  FALLS: Has patient fallen in last 6 months? No  LIVING ENVIRONMENT: Lives with: Alone; son  lives next door Lives in: House/apartment Stairs: Ramped entrance Has following equipment at home: Single point cane, Environmental consultant - 2 wheeled, Wheelchair (manual), Shower bench, bed side commode, and Grab bars  PLOF: Independent  PATIENT GOALS: To improve FMC   OBJECTIVE:  Note: Objective measures were completed at Evaluation unless otherwise noted.  HAND DOMINANCE: Right  ADLs: Overall ADLs:  Transfers/ambulation related to ADLs: Eating:  Independent, difficulty cutting steak Grooming:  Independent UB Dressing: Difficulty with fastening a bra. Fastens in front and is hard to turn it to the back, Difficulty buttoning LB Dressing: Independent with slide on shoes, elastic pants.  Toileting:  difficulty using right hand for posterior toilet hygiene care-uses modified technique. Bathing:   Weakness with UE's noted when washing UEs, shaving legs is difficulty Tub Shower transfers: Modified independent  IADLs: Shopping: Independent Light housekeeping: Mopping is difficult  2/2 back pain Meal Prep: Independent heating items up, cooking eggs. Community mobility: Independent- increased awareness;  has slowed down Medication management: Unable to grasp pills from the tabletop surface. Is able to from her hand. Pillbox-2 weeks at a time Financial management: Transitioning to online accounts Handwriting: 50% legible, micrographia-smaller towards end of her name, has jaw tremor when writing.            Other: Pt. 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   MOBILITY STATUS: Independent  POSTURE COMMENTS:  No Significant postural limitations  ACTIVITY TOLERANCE: Activity tolerance: fatigues after 20 min. of activity  FUNCTIONAL OUTCOME MEASURES: MAM-20: 56/80  UPPER EXTREMITY ROM:    Active ROM Right Eval WFL Left Eval [facility]  Shoulder flexion    Shoulder abduction    Shoulder adduction    Shoulder extension    Shoulder internal rotation    Shoulder external rotation    Elbow flexion    Elbow extension    Wrist flexion    Wrist extension    Wrist ulnar deviation    Wrist radial deviation    Wrist pronation    Wrist supination    (Blank rows = not tested)  UPPER EXTREMITY MMT:     MMT Right eval Left Eval 5/5  Shoulder flexion 4/5   Shoulder abduction 4/5   Shoulder adduction    Shoulder extension    Shoulder internal rotation    Shoulder external rotation    Middle trapezius    Lower trapezius    Elbow flexion 5/5   Elbow extension 5/5   Wrist flexion 5/5   Wrist extension 5/5   Wrist ulnar deviation    Wrist radial deviation    Wrist pronation 5/5   Wrist supination 5/5   (Blank rows = not tested)  HAND FUNCTION: Grip strength: Right: 35 lbs; Left: 47  lbs, Lateral pinch: Right: 13 lbs, Left: 12 lbs, and 3 point pinch: Right: 6 lbs, Left: 10 lbs  COORDINATION: 9 Hole Peg test: Right: 40 sec; Left: 24 sec  SENSATION: Light touch: WFL Proprioception: WFL  EDEMA:  N/A  MUSCLE TONE: Intact  COGNITION: Overall cognitive status: Within functional limits for tasks assessed  **Pt. Reports changes with word finding. Is interested in an ST referral following OT/PT**  VISION: Subjective report: Wears glasses. Upgraded prescription 3-4 months ago  PERCEPTION: WFL  PRAXIS: [facility]  TREATMENT DATE: 09/02/24  Neuro re-ed: -Box and Blocks: 59 blocks for R, 59 blocks for L in 1 min  -Facilitated R/L GMC/FMC skills with focus on speed.  Pt completed timed trials to compare R/L with goal of increasing speed with each trial for activity below  -Pt picked up washers from magnetic dish and threaded them over a vertical dowel; practiced item storage and translatory skills working with 1-3 washers   at a time.    Self Care: -HEP review: Recommendation to reduce theraputty time to 10-15 min to prevent pain in hands from overuse -Review of activity modifications to improve efficiency/ease of lawn care, including filling/pouring gasoline into leaf blower- reduce container size to ease pouring, stabilize bottom of container on support surface while pouring, use funnel, keep containers close to body while pouring to improve stability  PATIENT EDUCATION: Education details: IADL strategies/activity modification Person educated: Patient Education method: Explanation Education comprehension: verbalized understanding  HOME EXERCISE PROGRAM: Pink theraputty, BUE strengthening: dumbbells/blue theraband  GOALS: Goals reviewed with patient? Yes  SHORT TERM GOALS: Target date: 09/24/2024  Pt. Will be independent with HEPs for the RUE, and hand  strength, and coordination Baseline: Eval: No current HEP Goal status: INITIAL  LONG TERM GOALS: Target date: 11/05/2024  Pt. Will improve right shoulder strength by 2 mm grades to assist with ADLs, and IADLs. Baseline: Eval: Right shoulder flexion: 4/5, Abduction: 4/5. Goal status: INITIAL  2.  Pt. Will improve right grip strength by 5# to be able to securely hold, and pass weighted cups of soup. Baseline: Eval: Right: 35#, Left: 47# Goal status: INITIAL  3.  Pt. Will improve right hand pinch strength by 3# to be able to cut meat more efficiently Baseline: Eval: Lateral pinch: Right: 13 lbs, Left: 12 lbs, and 3 point pinch: Right: 6 lbs, Left: 10 lbs Goal status: INITIAL  4.  Pt. Will improve right hand [facility] skills in order to be able to grasp small items from a flat tabletop surface. Baseline: Eval: Right: 40 sec., Left: 24 sec. Goal status: INITIAL  5.  Pt. Will identify/demonstrate 3 compensatory strategies for tremors during ADL/IADLs, and when holding a hymn book in choir. Baseline: Eval: Education to be provided . Pt. Has difficulty stabilizing a hymn book in choir. Goal status: INITIAL  6.  Pt. Will write one sentence with 100% legibility with consistent letter size throughout. Baseline: Eval: Name only: 50%,with micrographia 1/2 way through. Goal status: INITIAL  ASSESSMENT:  CLINICAL IMPRESSION: Pt reports working on RUE coordination/speed flipping/sorting cards at home.  Pt acknowledges improvement in RUE motor control and speed now that she is aware of mild bradykinesia and activities to target this deficit/symptom.  Pt was able to increase RUE and LUE speed with each timed trial when manipulating washers in each hand and threading them over a vertical dowel.  Reviewed strategies for filling and pouring gasoline for yard equipment; pt receptive to strategies.  Pt reports that she was able to take notes for a church meeting over the course of 1 hour and 10 min, noting good  legibility.  Pt reports that historically she has been unable to read her writing after these meetings, and has been able to verbalize and implement several strategies to reduce micrographia.  Pt. will continue to benefit from OT services to work on improving her dominant RUE functioning in order to maximize overall independence with ADLs/IADL tasks, and to provide education on work simplification/activity modification, and compensatory strategies to manage tremors with  daily tasks.     PERFORMANCE DEFICITS: in functional skills including , cognitive skills including , and psychosocial skills including coping strategies, environmental adaptation, interpersonal interactions, and routines and behaviors.   IMPAIRMENTS: are limiting patient from ADLs, IADLs, and leisure.   CO-MORBIDITIES: may have co-morbidities  that affects occupational performance. Patient will benefit from skilled OT to address above impairments and improve overall function.  MODIFICATION OR ASSISTANCE TO COMPLETE EVALUATION: Min-Moderate modification of tasks or assist with assess necessary to complete an evaluation.  OT OCCUPATIONAL PROFILE AND HISTORY: Detailed assessment: Review of records and additional review of physical, cognitive, psychosocial history related to current functional performance.  CLINICAL DECISION MAKING: Moderate - several treatment options, min-mod task modification necessary  REHAB POTENTIAL: Good  EVALUATION COMPLEXITY: Moderate    PLAN:  OT FREQUENCY: 2x/week  OT DURATION: 12 weeks  PLANNED INTERVENTIONS: 97535 self care/ADL training, 02889 therapeutic exercise, 97530 therapeutic activity, 97112 neuromuscular re-education, 97140 manual therapy, 97018 paraffin, 02989 moist heat, 97010 cryotherapy, 97034 contrast bath, functional mobility training, energy conservation, coping strategies training, patient/family education, and DME and/or AE instructions  RECOMMENDED OTHER SERVICES: PT,  ST  CONSULTED AND AGREED WITH PLAN OF CARE: Patient  PLAN FOR NEXT SESSION: Treatment  Inocente Blazing, MS, OTR/L  09/03/2024, 3:49 PM

## 2024-09-04 ENCOUNTER — Ambulatory Visit

## 2024-09-04 DIAGNOSIS — M6281 Muscle weakness (generalized): Secondary | ICD-10-CM

## 2024-09-04 DIAGNOSIS — R278 Other lack of coordination: Secondary | ICD-10-CM

## 2024-09-04 DIAGNOSIS — R251 Tremor, unspecified: Secondary | ICD-10-CM

## 2024-09-04 NOTE — Therapy (Signed)
 OUTPATIENT PHYSICAL THERAPY TREATMENT   Patient Name: Janice Rowe MRN: [id number] DOB:1956-04-22, 68 y.o., female Today's Date: 09/04/2024   PCP: Fernande Ophelia JINNY DOUGLAS, MD REFERRING PROVIDER: Maree Jannett POUR, MD  END OF SESSION:   PT End of Session - 09/04/24 1637     Visit Number 6    Number of Visits 24    Date for Recertification  11/05/24    Authorization Type [employer]    Progress Note Due on Visit 10    PT Start Time 1531    PT Stop Time 1601    PT Time Calculation (min) 30 min    Activity Tolerance Patient tolerated treatment well;No increased pain;Patient limited by fatigue    Behavior During Therapy [facility] for tasks assessed/performed          Past Medical History:  Diagnosis Date   Acute pancreatitis 11/2008   Anemia    Arthritis    GERD (gastroesophageal reflux disease)    Hiatal hernia    Hypertension    Rheumatoid arthritis (HCC)    Sleep apnea    uses c-pap   Vitamin D  deficiency    Past Surgical History:  Procedure Laterality Date   CARPAL TUNNEL RELEASE Right 2013   COLONOSCOPY  2007   COLONOSCOPY WITH PROPOFOL  N/A 11/29/2016   Procedure: COLONOSCOPY WITH PROPOFOL ;  Surgeon: Reyes LELON Cota, MD;  Location: [facility] ENDOSCOPY;  Service: Endoscopy;  Laterality: N/A;   ESOPHAGOGASTRODUODENOSCOPY (EGD) WITH PROPOFOL  N/A 11/29/2016   Procedure: ESOPHAGOGASTRODUODENOSCOPY (EGD) WITH PROPOFOL ;  Surgeon: Reyes LELON Cota, MD;  Location: [facility] ENDOSCOPY;  Service: Endoscopy;  Laterality: N/A;   IR RADIOLOGIST EVAL & MGMT  05/29/2023   TOTAL HIP ARTHROPLASTY Right 02/18/2021   Procedure: TOTAL HIP ARTHROPLASTY ANTERIOR APPROACH;  Surgeon: Kathlynn Sharper, MD;  Location: [facility] ORS;  Service: Orthopedics;  Laterality: Right;   Patient Active Problem List   Diagnosis Date Noted   S/P hip replacement 02/18/2021   S/P total hip arthroplasty 02/18/2021   Anemia 10/04/2018   Essential hypertension 10/04/2018   Rheumatoid arthritis with rheumatoid factor (HCC)  10/04/2018   Vitamin D  deficiency 10/04/2018   Lung nodule seen on imaging study 08/21/2017   Benign microscopic hematuria 02/06/2017   Cardiac murmur 11/08/2016   Dysphagia 10/11/2016   Encounter for screening colonoscopy 10/11/2016   Rectal bleeding 10/11/2016   Ocular migraine 08/02/2016   Recurrent major depressive disorder, in full remission 01/27/2016   Obstructive sleep apnea 09/15/2015    ONSET DATE: Dx w/ Parkinsons June 05, 2024  REFERRING DIAG: Tremors, gait changes, stiffness   THERAPY DIAG:  Muscle weakness (generalized)  Other lack of coordination  Rationale for Evaluation and Treatment: Rehabilitation  SUBJECTIVE:  SUBJECTIVE STATEMENT: Pt says she is good today. She arrives straight from OT.   Pt accompanied by: self  PERTINENT HISTORY:  Patient reports that she was diagnosed with Parkinsons June 05, 2024. Patient reporting that she enrolled in [facility] ~4 wks ago; reports tremors are predominantly in her hands & R leg. Pt with relevant PMH of RA & hx of compression fractures, so she would like to gain strength, learn how to safely exercise/be more active, improve endurance, & improve posture. Pt reports she also has had a R total hip replacement. Pt reports she sometimes does have trouble with stairs & has to rely on railing for support.   At baseline, pt reports completing STSs daily & walks 1-2 mi/day. Per neurologist note 08/05/24: Parkinsonism symptoms - Tremors, gait changes, stiffness Parkinson's disease with symptoms including tremors, bradykinesia, rigidity, and postural instability. Symptoms have been worsening, particularly in the right hand, with additional tremors in the lips and legs at rest. There is also a decrease in fine motor skills, balance issues, and  handwriting changes. The differential diagnosis includes essential tremor, but the clinical presentation is more consistent with Parkinson's disease. Patient reports good sense of smell. No active dreams. Constipation, patient takes prune juice. Handwriting getting smaller. Patient reports no problems with loud/soft voice. Difficulties with walking. Taking gabapentin , however slowly weaning off. Carbidopa-levodopa has improved tremors overall.   PAIN:  Are you having pain? No  PRECAUTIONS: Fall  WEIGHT BEARING RESTRICTIONS: No  FALLS: Has patient fallen in last 6 months? No; reports stumbles, near falls when pivoting, turning, etc.   LIVING ENVIRONMENT: Lives with: lives alone; son lives next door Lives in: House/apartment Stairs: No Has following equipment at home: Single point cane, Quad cane small base, Walker - 2 wheeled, Environmental consultant - 4 wheeled, Wheelchair (manual), Shower bench, Grab bars, and Ramped entry; pt states that she has sufficient equipment from husband who passed w/ PD  PLOF: Independent  PATIENT GOALS: get stronger, have more endurance, improve posture; learn how to exercise safely  OBJECTIVE:  Note: Objective measures were completed at Evaluation unless otherwise noted.                                                                                               TREATMENT DATE: 09/04/2024 -Pickleball sports training for balance training, preparation for social fitness activity with friends: 15 minutes *pt able to perform all activities without more than 1 minor LOB, including able to pick up balls from floor.   -65lb chair sled push, 15ft max speed;  -lateral shuffle with basketball rebounding 1x55ft bilat  -65lb chair sled push, 19ft max speed;  -backward shuffle with basketball sle ftoss/catch 1x130ft  -65lb chair sled push, 78ft max speed;   -foam stance battle rope pull-in, attached to 31.5 lb cable resistance (51ft in, 68ft out, twice)      PATIENT  EDUCATION: Education details: POC, findings of exam, HEP Person educated: Patient Education method: Explanation Education comprehension: verbalized understanding and needs further education  HOME EXERCISE PROGRAM: Access Code: [email or web address] URL: [email or web address] Date: 08/13/2024 Prepared by: Chiquita Silvan  Exercises -  Tandem Walking with Counter Support  - 1 x daily - 7 x weekly - 3 sets - 2-3 reps  GOALS: Goals reviewed with patient? Yes  SHORT TERM GOALS: Target date: 09/24/24  Patient will be independent with home exercise program to improve strength/mobility for increased functional independence with ADLs and mobility. Baseline: establish formal HEP at future visits Goal status: INITIAL  LONG TERM GOALS: Target date: 11/05/24  Patient will improve ABC scale score >80% to demonstrate increased confidence with functional mobility and ADLs.   Baseline: 75.625% Goal status: INITIAL  2.  Patient will improve 30sec STS score to 15x to demonstrate improved functional strength & endurance.  Baseline: 12x Goal status: INITIAL  3.  Patient will increase MiniBest Test score to >18/28 to indicate a reduced risk for falling and demonstrate increased independence with functional mobility and ADLs. Baseline: 23/28 Goal status: INITIAL  4.  Patient will increase six minute walk test distance to >1084ft for progression to community level ambulation, demonstrating improved gait endurance. Baseline: 08/19/24: 1347ft AMB;  Goal status: INITIAL  ASSESSMENT:  CLINICAL IMPRESSION:  Continued with a diverse variety of activities to stimulate balance control, proprioception, and high velocity and/or high power movements. Pt asked about wanting to participate in pickleball with friends, but they were concerned about her falling- we took time to simulate game play with success today, pt able to build some confidence with activity while working with on coordinate and reaction time.  Pt reports no exacerbation of pain areas during session, will relay any soreness issues after the fact. The pt will benefit from further skilled PT to improve these deficits in order to increase QOL and ease/safety with ADLs.   OBJECTIVE IMPAIRMENTS: Abnormal gait, decreased activity tolerance, decreased balance, decreased endurance, difficulty walking, and decreased strength.  ACTIVITY LIMITATIONS: carrying, lifting, bending, stairs, and reach over head PARTICIPATION LIMITATIONS: laundry, shopping, community activity, and yard work PERSONAL FACTORS: Age, Sex, and 3+ comorbidities: RA, HTN, hx of compression fractures are also affecting patient's functional outcome.  REHAB POTENTIAL: Good CLINICAL DECISION MAKING: Evolving/moderate complexity EVALUATION COMPLEXITY: Moderate  PLAN:  PT FREQUENCY: 1-2x/week PT DURATION: 12 weeks PLANNED INTERVENTIONS: 97164- PT Re-evaluation, 97750- Physical Performance Testing, 97110-Therapeutic exercises, 97530- Therapeutic activity, 97112- Neuromuscular re-education, 97535- Self Care, 02859- Manual therapy, [phone]- Gait training, [phone]- Canalith repositioning, [phone] (1-2 muscles), 20561 (3+ muscles)- Dry Needling, Patient/Family education, Balance training, Stair training, Joint mobilization, Joint manipulation, Spinal manipulation, Spinal mobilization, Vestibular training, Cryotherapy, and Moist heat  PLAN FOR NEXT SESSION:   Ketzia Guzek C, PT 09/04/2024, 4:41 PM  4:41 PM, 09/04/24 Peggye JAYSON Linear, PT, DPT Physical Therapist - [facility]  Outpatient Physical Therapy- Main Campus [phone]

## 2024-09-04 NOTE — Therapy (Signed)
 OUTPATIENT OCCUPATIONAL THERAPY NEURO TREATMENT NOTE  Patient Name: Janice Rowe MRN: [id number] DOB:[birth year], 68 y.o., female Today's Date: 09/04/2024  PCP: Fernande Ophelia PARAS. III, MD REFERRING PROVIDER: Maree Hila, MARLA, MD  END OF SESSION:  OT End of Session - 09/04/24 1644     Visit Number 6    Number of Visits 24    Date for Recertification  11/05/24    OT Start Time 1450    OT Stop Time 1530    OT Time Calculation (min) 40 min    Activity Tolerance Patient tolerated treatment well    Behavior During Therapy [facility] for tasks assessed/performed         Past Medical History:  Diagnosis Date   Acute pancreatitis 11/2008   Anemia    Arthritis    GERD (gastroesophageal reflux disease)    Hiatal hernia    Hypertension    Rheumatoid arthritis (HCC)    Sleep apnea    uses c-pap   Vitamin D  deficiency    Past Surgical History:  Procedure Laterality Date   CARPAL TUNNEL RELEASE Right 2013   COLONOSCOPY  2007   COLONOSCOPY WITH PROPOFOL  N/A 11/29/2016   Procedure: COLONOSCOPY WITH PROPOFOL ;  Surgeon: Reyes LELON Cota, MD;  Location: [facility] ENDOSCOPY;  Service: Endoscopy;  Laterality: N/A;   ESOPHAGOGASTRODUODENOSCOPY (EGD) WITH PROPOFOL  N/A 11/29/2016   Procedure: ESOPHAGOGASTRODUODENOSCOPY (EGD) WITH PROPOFOL ;  Surgeon: Reyes LELON Cota, MD;  Location: [facility] ENDOSCOPY;  Service: Endoscopy;  Laterality: N/A;   IR RADIOLOGIST EVAL & MGMT  05/29/2023   TOTAL HIP ARTHROPLASTY Right 02/18/2021   Procedure: TOTAL HIP ARTHROPLASTY ANTERIOR APPROACH;  Surgeon: Kathlynn Sharper, MD;  Location: [facility] ORS;  Service: Orthopedics;  Laterality: Right;   Patient Active Problem List   Diagnosis Date Noted   S/P hip replacement 02/18/2021   S/P total hip arthroplasty 02/18/2021   Anemia 10/04/2018   Essential hypertension 10/04/2018   Rheumatoid arthritis with rheumatoid factor (HCC) 10/04/2018   Vitamin D  deficiency 10/04/2018   Lung nodule seen on imaging study 08/21/2017   Benign microscopic  hematuria 02/06/2017   Cardiac murmur 11/08/2016   Dysphagia 10/11/2016   Encounter for screening colonoscopy 10/11/2016   Rectal bleeding 10/11/2016   Ocular migraine 08/02/2016   Recurrent major depressive disorder, in full remission 01/27/2016   Obstructive sleep apnea 09/15/2015   ONSET DATE: 2024  REFERRING DIAG:  Parkinson's Disease  THERAPY DIAG:  Muscle weakness (generalized)  Other lack of coordination  Tremor  Rationale for Evaluation and Treatment: Rehabilitation  SUBJECTIVE:  SUBJECTIVE STATEMENT: Pt reports that she continues to feel stronger and is feeling better than she's felt in quite awhile.  Pt accompanied by: self  PERTINENT HISTORY: Pt. is a 68 y.o. female who was is currently being followed by Dr. Maree in Neurology for Parkinson's Disease. PMHx includes: Tremors, RA, and Vertebral Fractures.  PRECAUTIONS: None  WEIGHT BEARING RESTRICTIONS: No  PAIN: 09/04/24: mild pain in hands by end of session (hx of RA) Are you having pain? 2/10 pain in back at initial eval, however typicially 4/10  FALLS: Has patient fallen in last 6 months? No  LIVING ENVIRONMENT: Lives with: Alone; son  lives next door Lives in: House/apartment Stairs: Ramped entrance Has following equipment at home: Single point cane, Environmental consultant - 2 wheeled, Wheelchair (manual), Shower bench, bed side commode, and Grab bars  PLOF: Independent  PATIENT GOALS: To improve FMC   OBJECTIVE:  Note: Objective measures were completed at Evaluation unless otherwise noted.  HAND  DOMINANCE: Right  ADLs: Overall ADLs:  Transfers/ambulation related to ADLs: Eating:  Independent, difficulty cutting steak Grooming:  Independent UB Dressing: Difficulty with fastening a bra. Fastens in front and is hard to turn it to the back, Difficulty buttoning LB Dressing: Independent with slide on shoes, elastic pants.  Toileting:  difficulty using right hand for posterior toilet hygiene care-uses modified  technique. Bathing:  Weakness with UE's noted when washing UEs, shaving legs is difficulty Tub Shower transfers: Modified independent  IADLs: Shopping: Independent Light housekeeping: Mopping is difficult  2/2 back pain Meal Prep: Independent heating items up, cooking eggs. Community mobility: Independent- increased awareness;  has slowed down Medication management: Unable to grasp pills from the tabletop surface. Is able to from her hand. Pillbox-2 weeks at a time Financial management: Transitioning to online accounts Handwriting: 50% legible, micrographia-smaller towards end of her name, has jaw tremor when writing.            Other: Pt. 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   MOBILITY STATUS: Independent  POSTURE COMMENTS:  No Significant postural limitations  ACTIVITY TOLERANCE: Activity tolerance: fatigues after 20 min. of activity  FUNCTIONAL OUTCOME MEASURES: MAM-20: 56/80  UPPER EXTREMITY ROM:    Active ROM Right Eval WFL Left Eval [facility]  Shoulder flexion    Shoulder abduction    Shoulder adduction    Shoulder extension    Shoulder internal rotation    Shoulder external rotation    Elbow flexion    Elbow extension    Wrist flexion    Wrist extension    Wrist ulnar deviation    Wrist radial deviation    Wrist pronation    Wrist supination    (Blank rows = not tested)  UPPER EXTREMITY MMT:     MMT Right eval Left Eval 5/5  Shoulder flexion 4/5   Shoulder abduction 4/5   Shoulder adduction    Shoulder extension    Shoulder internal rotation    Shoulder external rotation    Middle trapezius    Lower trapezius    Elbow flexion 5/5   Elbow extension 5/5   Wrist flexion 5/5   Wrist extension 5/5   Wrist ulnar deviation    Wrist radial deviation    Wrist pronation 5/5   Wrist supination 5/5   (Blank rows = not tested)  HAND FUNCTION: Grip strength:  Right: 35 lbs; Left: 47 lbs, Lateral pinch: Right: 13 lbs, Left: 12 lbs, and 3 point pinch: Right: 6 lbs, Left: 10 lbs 09/04/24: R 65 lbs, L 66 lbs   COORDINATION: 9 Hole Peg test: Right: 40 sec; Left: 24 sec  SENSATION: Light touch: WFL Proprioception: WFL  EDEMA:  N/A  MUSCLE TONE: Intact  COGNITION: Overall cognitive status: Within functional limits for tasks assessed  **Pt. Reports changes with word finding. Is interested in an ST referral following OT/PT**  VISION: Subjective report: Wears glasses. Upgraded prescription 3-4 months ago  PERCEPTION: WFL  PRAXIS: [facility]  TREATMENT DATE: 09/04/24   Self Care: -HEP review: monitor RA symptoms to avoid pain from over use/too many reps; limit putty use to a duration that does not increase pain in hands -Review of compensatory strategies to manage tremors while singing at church -Review of handwriting strategies to minimize micrographia.   Therapeutic Exercise: -Wall stretch to promote thoracic ext and shoulder retraction to promote erect standing posture; vc for engaging core and maintaining neutral head position.   -Supine towel stretch to promote thoracic ext and shoulder retraction to promote erect standing posture.  Encouraged 3-5 min daily.  -Instructed in gentle passive stretching techniques to promote R hand MP ext, thumb abd and ext, B wrist ext with good return demo, working to maintain joint range and slow decline from RA and/or PD; good return demo  PATIENT EDUCATION: Education details: HEP review/progression, progress towards goals Person educated: Patient Education method: Explanation Education comprehension: verbalized understanding  HOME EXERCISE PROGRAM: Pink theraputty, BUE strengthening: dumbbells/blue theraband, wall stretch/towel stretch, wrist and digit ext stretching  GOALS: Goals reviewed  with patient? Yes  SHORT TERM GOALS: Target date: 09/24/2024  Pt. Will be independent with HEPs for the RUE, and hand strength, and coordination Baseline: Eval: No current HEP Goal status: INITIAL  LONG TERM GOALS: Target date: 11/05/2024  Pt. Will improve right shoulder strength by 2 mm grades to assist with ADLs, and IADLs. Baseline: Eval: Right shoulder flexion: 4/5, Abduction: 4/5. Goal status: INITIAL  2.  Pt. Will improve right grip strength by 5# to be able to securely hold, and pass weighted cups of soup. Baseline: Eval: Right: 35#, Left: 47# Goal status: INITIAL  3.  Pt. Will improve right hand pinch strength by 3# to be able to cut meat more efficiently Baseline: Eval: Lateral pinch: Right: 13 lbs, Left: 12 lbs, and 3 point pinch: Right: 6 lbs, Left: 10 lbs Goal status: INITIAL  4.  Pt. Will improve right hand [facility] skills in order to be able to grasp small items from a flat tabletop surface. Baseline: Eval: Right: 40 sec., Left: 24 sec. Goal status: INITIAL  5.  Pt. Will identify/demonstrate 3 compensatory strategies for tremors during ADL/IADLs, and when holding a hymn book in choir. Baseline: Eval: Education to be provided . Pt. Has difficulty stabilizing a hymn book in choir. Goal status: INITIAL  6.  Pt. Will write one sentence with 100% legibility with consistent letter size throughout. Baseline: Eval: Name only: 50%,with micrographia 1/2 way through. Goal status: INITIAL  ASSESSMENT:  CLINICAL IMPRESSION: Pt tolerated wall and towel stretch well today to promote erect standing posture.  Fair tolerance to wrist and digit passive stretching.  Reinforcement to limit daily stretching to pain free ranges d/t hx of RA.  Grip strength reassessed today, noting 30 lb increase in the R hand, and 19 lb increase in the L hand.  Progress towards goals and poc both reviewed today.  Anticipate readiness to d/c next session as pt is showing good adherence to and understanding of  HEP, and has been educated on activity modifications to better manage tremors during ADL/IADLs.  Pt in agreement with plan to complete d/c assessment next session.  PERFORMANCE DEFICITS: in functional skills including , cognitive skills including , and psychosocial skills including coping strategies, environmental adaptation, interpersonal interactions, and routines and behaviors.   IMPAIRMENTS: are limiting patient from ADLs, IADLs, and leisure.   CO-MORBIDITIES: may have co-morbidities  that affects occupational performance. Patient will benefit from skilled OT to address above  impairments and improve overall function.  MODIFICATION OR ASSISTANCE TO COMPLETE EVALUATION: Min-Moderate modification of tasks or assist with assess necessary to complete an evaluation.  OT OCCUPATIONAL PROFILE AND HISTORY: Detailed assessment: Review of records and additional review of physical, cognitive, psychosocial history related to current functional performance.  CLINICAL DECISION MAKING: Moderate - several treatment options, min-mod task modification necessary  REHAB POTENTIAL: Good  EVALUATION COMPLEXITY: Moderate    PLAN:  OT FREQUENCY: 2x/week  OT DURATION: 12 weeks  PLANNED INTERVENTIONS: 97535 self care/ADL training, 02889 therapeutic exercise, 97530 therapeutic activity, 97112 neuromuscular re-education, 97140 manual therapy, 97018 paraffin, 02989 moist heat, 97010 cryotherapy, 97034 contrast bath, functional mobility training, energy conservation, coping strategies training, patient/family education, and DME and/or AE instructions  RECOMMENDED OTHER SERVICES: PT, ST  CONSULTED AND AGREED WITH PLAN OF CARE: Patient  PLAN FOR NEXT SESSION: d/c  Inocente Blazing, MS, OTR/L  09/04/2024, 4:46 PM

## 2024-09-09 ENCOUNTER — Ambulatory Visit

## 2024-09-09 DIAGNOSIS — M6281 Muscle weakness (generalized): Secondary | ICD-10-CM

## 2024-09-09 DIAGNOSIS — R278 Other lack of coordination: Secondary | ICD-10-CM

## 2024-09-09 DIAGNOSIS — R251 Tremor, unspecified: Secondary | ICD-10-CM

## 2024-09-09 DIAGNOSIS — R2681 Unsteadiness on feet: Secondary | ICD-10-CM

## 2024-09-09 DIAGNOSIS — R262 Difficulty in walking, not elsewhere classified: Secondary | ICD-10-CM

## 2024-09-09 NOTE — Therapy (Signed)
 OUTPATIENT PHYSICAL THERAPY TREATMENT   Patient Name: Janice Rowe MRN: [id number] DOB:[birth year], 68 y.o., female Today's Date: 09/09/2024  PCP: Fernande Ophelia JINNY DOUGLAS, MD REFERRING PROVIDER: Maree Jannett POUR, MD  END OF SESSION:   PT End of Session - 09/09/24 1450     Visit Number 7    Number of Visits 24    Date for Recertification  11/05/24    Authorization Type [employer]    Progress Note Due on Visit 10    PT Start Time 1448    PT Stop Time 1528    PT Time Calculation (min) 40 min    Equipment Utilized During Treatment Gait belt    Activity Tolerance Patient tolerated treatment well;No increased pain;Patient limited by fatigue    Behavior During Therapy [facility] for tasks assessed/performed          Past Medical History:  Diagnosis Date   Acute pancreatitis 11/2008   Anemia    Arthritis    GERD (gastroesophageal reflux disease)    Hiatal hernia    Hypertension    Rheumatoid arthritis (HCC)    Sleep apnea    uses c-pap   Vitamin D  deficiency    Past Surgical History:  Procedure Laterality Date   CARPAL TUNNEL RELEASE Right 2013   COLONOSCOPY  2007   COLONOSCOPY WITH PROPOFOL  N/A 11/29/2016   Procedure: COLONOSCOPY WITH PROPOFOL ;  Surgeon: Reyes LELON Cota, MD;  Location: [facility] ENDOSCOPY;  Service: Endoscopy;  Laterality: N/A;   ESOPHAGOGASTRODUODENOSCOPY (EGD) WITH PROPOFOL  N/A 11/29/2016   Procedure: ESOPHAGOGASTRODUODENOSCOPY (EGD) WITH PROPOFOL ;  Surgeon: Reyes LELON Cota, MD;  Location: [facility] ENDOSCOPY;  Service: Endoscopy;  Laterality: N/A;   IR RADIOLOGIST EVAL & MGMT  05/29/2023   TOTAL HIP ARTHROPLASTY Right 02/18/2021   Procedure: TOTAL HIP ARTHROPLASTY ANTERIOR APPROACH;  Surgeon: Kathlynn Sharper, MD;  Location: [facility] ORS;  Service: Orthopedics;  Laterality: Right;   Patient Active Problem List   Diagnosis Date Noted   S/P hip replacement 02/18/2021   S/P total hip arthroplasty 02/18/2021   Anemia 10/04/2018   Essential hypertension 10/04/2018   Rheumatoid  arthritis with rheumatoid factor (HCC) 10/04/2018   Vitamin D  deficiency 10/04/2018   Lung nodule seen on imaging study 08/21/2017   Benign microscopic hematuria 02/06/2017   Cardiac murmur 11/08/2016   Dysphagia 10/11/2016   Encounter for screening colonoscopy 10/11/2016   Rectal bleeding 10/11/2016   Ocular migraine 08/02/2016   Recurrent major depressive disorder, in full remission 01/27/2016   Obstructive sleep apnea 09/15/2015    ONSET DATE: Dx w/ Parkinsons June 05, 2024  REFERRING DIAG: Tremors, gait changes, stiffness   THERAPY DIAG:  Muscle weakness (generalized)  Other lack of coordination  Tremor  Unsteadiness on feet  Difficulty in walking, not elsewhere classified  Rationale for Evaluation and Treatment: Rehabilitation  SUBJECTIVE:  SUBJECTIVE STATEMENT: Pt says she is good today but very tired, did not sleep well. Pt was very active all day Saturday helping [employer] [place] [employer] work day.   Pt accompanied by: self  PERTINENT HISTORY:  Patient reports that she was diagnosed with Parkinsons June 05, 2024. Patient reporting that she enrolled in [facility] ~4 wks ago; reports tremors are predominantly in her hands & R leg. Pt with relevant PMH of RA & hx of compression fractures, so she would like to gain strength, learn how to safely exercise/be more active, improve endurance, & improve posture. Pt reports she also has had a R total hip replacement. Pt reports she sometimes does have trouble with stairs & has to rely on railing for support.   At baseline, pt reports completing STSs daily & walks 1-2 mi/day. Per neurologist note 08/05/24: Parkinsonism symptoms - Tremors, gait changes, stiffness Parkinson's disease with symptoms including tremors, bradykinesia, rigidity, and  postural instability. Symptoms have been worsening, particularly in the right hand, with additional tremors in the lips and legs at rest. There is also a decrease in fine motor skills, balance issues, and handwriting changes. The differential diagnosis includes essential tremor, but the clinical presentation is more consistent with Parkinson's disease. Patient reports good sense of smell. No active dreams. Constipation, patient takes prune juice. Handwriting getting smaller. Patient reports no problems with loud/soft voice. Difficulties with walking. Taking gabapentin , however slowly weaning off. Carbidopa-levodopa has improved tremors overall.   PAIN:  Are you having pain? No  PRECAUTIONS: Fall  WEIGHT BEARING RESTRICTIONS: No  FALLS: Has patient fallen in last 6 months? No; reports stumbles, near falls when pivoting, turning, etc.   LIVING ENVIRONMENT: Lives with: lives alone; son lives next door Lives in: House/apartment Stairs: No Has following equipment at home: Single point cane, Quad cane small base, Walker - 2 wheeled, Environmental consultant - 4 wheeled, Wheelchair (manual), Shower bench, Grab bars, and Ramped entry; pt states that she has sufficient equipment from husband who passed w/ PD  PLOF: Independent  PATIENT GOALS: get stronger, have more endurance, improve posture; learn how to exercise safely  OBJECTIVE:  Note: Objective measures were completed at Evaluation unless otherwise noted.                                                                                               TREATMENT DATE: 09/09/2024 -Pickleball sports training for balance training, preparation for social fitness activity with friends: 15 minutes *pt able to perform all activities without more than 1 minor LOB, including able to pick up balls from floor.   -lateral shuffle with overhead rebounding in hallway x2 minutes -alternating forward backward shuffle with basketball short and long chest passes;   -31.5lb  cable pull out, walk back in 3x -21.5lb cable resistance at pelvis while working agilitay ladder patterns facing forward and facing backward -agility ladder sidestepping with pickle ball juggling off paddle 2 attempts each direction     PATIENT EDUCATION: Education details: POC, findings of exam, HEP Person educated: Patient Education method: Explanation Education comprehension: verbalized understanding and needs further education  HOME EXERCISE  PROGRAM: Access Code: [email or web address] URL: [email or web address] Date: 08/13/2024 Prepared by: Chiquita Silvan  Exercises - Tandem Walking with Counter Support  - 1 x daily - 7 x weekly - 3 sets - 2-3 reps  GOALS: Goals reviewed with patient? Yes  SHORT TERM GOALS: Target date: 09/24/24  Patient will be independent with home exercise program to improve strength/mobility for increased functional independence with ADLs and mobility. Baseline: establish formal HEP at future visits Goal status: INITIAL  LONG TERM GOALS: Target date: 11/05/24  Patient will improve ABC scale score >80% to demonstrate increased confidence with functional mobility and ADLs.   Baseline: 75.625% Goal status: INITIAL  2.  Patient will improve 30sec STS score to 15x to demonstrate improved functional strength & endurance.  Baseline: 12x Goal status: INITIAL  3.  Patient will increase MiniBest Test score to >18/28 to indicate a reduced risk for falling and demonstrate increased independence with functional mobility and ADLs. Baseline: 23/28 Goal status: INITIAL  4.  Patient will increase six minute walk test distance to >1020ft for progression to community level ambulation, demonstrating improved gait endurance. Baseline: 08/19/24: 1341ft AMB;  Goal status: INITIAL  ASSESSMENT:  CLINICAL IMPRESSION:  Continued with a diverse variety of activities to stimulate balance control, proprioception, and high velocity and/or high power movements. Pt again  looking very much appropriate to begin playing pickleball socially with contacts, no concerns about safety after 2nd demonstration of needed skills, footwork, and reaction timing today. Pt has several LOB during cable resistance stepping activities but is able to arrest most LOB without assist-does need help with some of these for safety. Pt continues to demonstrate great effort overall, high motivation. Chronic back pain has not yet been a limitation with these activity thus far. Pt reports no exacerbation of pain areas during session, will relay any soreness issues after the fact. The pt will benefit from further skilled PT to improve these deficits in order to increase QOL and ease/safety with ADLs.   OBJECTIVE IMPAIRMENTS: Abnormal gait, decreased activity tolerance, decreased balance, decreased endurance, difficulty walking, and decreased strength.  ACTIVITY LIMITATIONS: carrying, lifting, bending, stairs, and reach over head PARTICIPATION LIMITATIONS: laundry, shopping, community activity, and yard work PERSONAL FACTORS: Age, Sex, and 3+ comorbidities: RA, HTN, hx of compression fractures are also affecting patient's functional outcome.  REHAB POTENTIAL: Good CLINICAL DECISION MAKING: Evolving/moderate complexity EVALUATION COMPLEXITY: Moderate  PLAN:  PT FREQUENCY: 1-2x/week PT DURATION: 12 weeks PLANNED INTERVENTIONS: 97164- PT Re-evaluation, 97750- Physical Performance Testing, 97110-Therapeutic exercises, 97530- Therapeutic activity, [id number]- Neuromuscular re-education, 97535- Self Care, 02859- Manual therapy, [id number]- Gait training, [phone]- Canalith repositioning, [phone] (1-2 muscles), 20561 (3+ muscles)- Dry Needling, Patient/Family education, Balance training, Stair training, Joint mobilization, Joint manipulation, Spinal manipulation, Spinal mobilization, Vestibular training, Cryotherapy, and Moist heat  PLAN FOR NEXT SESSION: Start discussion about taking a pause in services after 10th  visit to allow for period of independent activity.   Skylah Delauter C, PT 09/09/2024, 3:10 PM  9:27 PM, 09/09/24 Peggye JAYSON Linear, PT, DPT Physical Therapist - [place] [facility]  Outpatient Physical Therapy- Main Campus [phone]     3:10 PM, 09/09/24 Peggye JAYSON Linear, PT, DPT Physical Therapist - [facility]  Outpatient Physical Therapy- Main Campus [phone]

## 2024-09-09 NOTE — Therapy (Unsigned)
 OUTPATIENT OCCUPATIONAL THERAPY NEURO TREATMENT NOTE  Patient Name: Janice Rowe MRN: [id number] DOB:[birth year], 68 y.o., female Today's Date: 09/09/2024  PCP: Fernande Ophelia PARAS. III, MD REFERRING PROVIDER: Maree Hila, MARLA, MD  END OF SESSION:  OT End of Session - 09/09/24 1534     Visit Number 7    Number of Visits 24    Date for Recertification  11/05/24    OT Start Time 1530    OT Stop Time 1615    OT Time Calculation (min) 45 min    Activity Tolerance Patient tolerated treatment well    Behavior During Therapy [facility] for tasks assessed/performed         Past Medical History:  Diagnosis Date   Acute pancreatitis 11/2008   Anemia    Arthritis    GERD (gastroesophageal reflux disease)    Hiatal hernia    Hypertension    Rheumatoid arthritis (HCC)    Sleep apnea    uses c-pap   Vitamin D  deficiency    Past Surgical History:  Procedure Laterality Date   CARPAL TUNNEL RELEASE Right 2013   COLONOSCOPY  2007   COLONOSCOPY WITH PROPOFOL  N/A 11/29/2016   Procedure: COLONOSCOPY WITH PROPOFOL ;  Surgeon: Reyes LELON Cota, MD;  Location: [facility] ENDOSCOPY;  Service: Endoscopy;  Laterality: N/A;   ESOPHAGOGASTRODUODENOSCOPY (EGD) WITH PROPOFOL  N/A 11/29/2016   Procedure: ESOPHAGOGASTRODUODENOSCOPY (EGD) WITH PROPOFOL ;  Surgeon: Reyes LELON Cota, MD;  Location: [facility] ENDOSCOPY;  Service: Endoscopy;  Laterality: N/A;   IR RADIOLOGIST EVAL & MGMT  05/29/2023   TOTAL HIP ARTHROPLASTY Right 02/18/2021   Procedure: TOTAL HIP ARTHROPLASTY ANTERIOR APPROACH;  Surgeon: Kathlynn Sharper, MD;  Location: [facility] ORS;  Service: Orthopedics;  Laterality: Right;   Patient Active Problem List   Diagnosis Date Noted   S/P hip replacement 02/18/2021   S/P total hip arthroplasty 02/18/2021   Anemia 10/04/2018   Essential hypertension 10/04/2018   Rheumatoid arthritis with rheumatoid factor (HCC) 10/04/2018   Vitamin D  deficiency 10/04/2018   Lung nodule seen on imaging study 08/21/2017   Benign microscopic  hematuria 02/06/2017   Cardiac murmur 11/08/2016   Dysphagia 10/11/2016   Encounter for screening colonoscopy 10/11/2016   Rectal bleeding 10/11/2016   Ocular migraine 08/02/2016   Recurrent major depressive disorder, in full remission 01/27/2016   Obstructive sleep apnea 09/15/2015   ONSET DATE: 2024  REFERRING DIAG:  Parkinson's Disease  THERAPY DIAG:  Muscle weakness (generalized)  Other lack of coordination  Tremor  Rationale for Evaluation and Treatment: Rehabilitation  SUBJECTIVE:  SUBJECTIVE STATEMENT: Pt reports that she continues to feel stronger and is feeling better than she's felt in quite awhile.  Pt accompanied by: self  PERTINENT HISTORY: Pt. is a 68 y.o. female who was is currently being followed by Dr. Maree in Neurology for Parkinson's Disease. PMHx includes: Tremors, RA, and Vertebral Fractures.  PRECAUTIONS: None  WEIGHT BEARING RESTRICTIONS: No  PAIN: 09/04/24: mild pain in hands by end of session (hx of RA) Are you having pain? 2/10 pain in back at initial eval, however typicially 4/10  FALLS: Has patient fallen in last 6 months? No  LIVING ENVIRONMENT: Lives with: Alone; son  lives next door Lives in: House/apartment Stairs: Ramped entrance Has following equipment at home: Single point cane, Environmental consultant - 2 wheeled, Wheelchair (manual), Shower bench, bed side commode, and Grab bars  PLOF: Independent  PATIENT GOALS: To improve FMC   OBJECTIVE:  Note: Objective measures were completed at Evaluation unless otherwise noted.  HAND  DOMINANCE: Right  ADLs: Overall ADLs:  Transfers/ambulation related to ADLs: Eating:  Independent, difficulty cutting steak Grooming:  Independent UB Dressing: Difficulty with fastening a bra. Fastens in front and is hard to turn it to the back, Difficulty buttoning LB Dressing: Independent with slide on shoes, elastic pants.  Toileting:  difficulty using right hand for posterior toilet hygiene care-uses modified  technique. Bathing:  Weakness with UE's noted when washing UEs, shaving legs is difficulty Tub Shower transfers: Modified independent  IADLs: Shopping: Independent Light housekeeping: Mopping is difficult  2/2 back pain Meal Prep: Independent heating items up, cooking eggs. Community mobility: Independent- increased awareness;  has slowed down Medication management: Unable to grasp pills from the tabletop surface. Is able to from her hand. Pillbox-2 weeks at a time Financial management: Transitioning to online accounts Handwriting: 50% legible, micrographia-smaller towards end of her name, has jaw tremor when writing.            Other: Pt. 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   MOBILITY STATUS: Independent  POSTURE COMMENTS:  No Significant postural limitations  ACTIVITY TOLERANCE: Activity tolerance: fatigues after 20 min. of activity  FUNCTIONAL OUTCOME MEASURES: MAM-20: 56/80 09/09/24: /80  UPPER EXTREMITY ROM:    Active ROM Right Eval WFL Left Eval [facility]  Shoulder flexion    Shoulder abduction    Shoulder adduction    Shoulder extension    Shoulder internal rotation    Shoulder external rotation    Elbow flexion    Elbow extension    Wrist flexion    Wrist extension    Wrist ulnar deviation    Wrist radial deviation    Wrist pronation    Wrist supination    (Blank rows = not tested)  UPPER EXTREMITY MMT:     MMT Right eval Left Eval 5/5  Shoulder flexion 4+/5   Shoulder abduction 4+/5   Shoulder adduction    Shoulder extension    Shoulder internal rotation    Shoulder external rotation    Middle trapezius    Lower trapezius    Elbow flexion 5/5   Elbow extension 5/5   Wrist flexion 5/5   Wrist extension 5/5   Wrist ulnar deviation    Wrist radial deviation    Wrist pronation 5/5   Wrist supination 5/5   (Blank rows = not tested)  HAND  FUNCTION: Grip strength: Right: 35 lbs; Left: 47 lbs, Lateral pinch: Right: 13 lbs, Left: 12 lbs, and 3 point pinch: Right: 6 lbs, Left: 10 lbs 09/04/24: R 65 lbs, L 66 lbs  09/09/24: R 67, L 64 lbs; Lateral pinch: Right: 13 lbs, Left: 10 lbs (Saehan); 3 point pinch: Right: 12 lbs, Left: 11 lbs Lanell)  COORDINATION: 9 Hole Peg test: Right: 40 sec; Left: 24 sec 09/09/24: Right: 24 sec; Left: 24 sec   SENSATION: Light touch: WFL Proprioception: WFL  EDEMA:  N/A  MUSCLE TONE: Intact  COGNITION: Overall cognitive status: Within functional limits for tasks assessed  **Pt. Reports changes with word finding. Is interested in an ST referral following OT/PT**  VISION: Subjective report: Wears glasses. Upgraded prescription 3-4 months ago  PERCEPTION: WFL  PRAXIS: [facility]  TREATMENT DATE: 09/04/24   Self Care: -HEP review: monitor RA symptoms to avoid pain from over use/too many reps; limit putty use to a duration that does not increase pain in hands -Review of compensatory strategies to manage tremors while singing at church -Review of handwriting strategies to minimize micrographia.   Therapeutic Exercise: -Wall stretch to promote thoracic ext and shoulder retraction to promote erect standing posture; vc for engaging core and maintaining neutral head position.   -Supine towel stretch to promote thoracic ext and shoulder retraction to promote erect standing posture.  Encouraged 3-5 min daily.  -Instructed in gentle passive stretching techniques to promote R hand MP ext, thumb abd and ext, B wrist ext with good return demo, working to maintain joint range and slow decline from RA and/or PD; good return demo  PATIENT EDUCATION: Education details: HEP review/progression, progress towards goals Person educated: Patient Education method: Explanation Education comprehension:  verbalized understanding  HOME EXERCISE PROGRAM: Pink theraputty, BUE strengthening: dumbbells/blue theraband, wall stretch/towel stretch, wrist and digit ext stretching  GOALS: Goals reviewed with patient? Yes  SHORT TERM GOALS: Target date: 09/24/2024  Pt. Will be independent with HEPs for the RUE, and hand strength, and coordination Baseline: Eval: No current HEP Goal status: INITIAL  LONG TERM GOALS: Target date: 11/05/2024  Pt. Will improve right shoulder strength by 2 mm grades to assist with ADLs, and IADLs. Baseline: Eval: Right shoulder flexion: 4/5, Abduction: 4/5. Goal status: INITIAL  2.  Pt. Will improve right grip strength by 5# to be able to securely hold, and pass weighted cups of soup. Baseline: Eval: Right: 35#, Left: 47# Goal status: INITIAL  3.  Pt. Will improve right hand pinch strength by 3# to be able to cut meat more efficiently Baseline: Eval: Lateral pinch: Right: 13 lbs, Left: 12 lbs, and 3 point pinch: Right: 6 lbs, Left: 10 lbs Goal status: INITIAL  4.  Pt. Will improve right hand [facility] skills in order to be able to grasp small items from a flat tabletop surface. Baseline: Eval: Right: 40 sec., Left: 24 sec. Goal status: INITIAL  5.  Pt. Will identify/demonstrate 3 compensatory strategies for tremors during ADL/IADLs, and when holding a hymn book in choir. Baseline: Eval: Education to be provided . Pt. Has difficulty stabilizing a hymn book in choir. Goal status: INITIAL  6.  Pt. Will write one sentence with 100% legibility with consistent letter size throughout. Baseline: Eval: Name only: 50%,with micrographia 1/2 way through. Goal status: INITIAL  ASSESSMENT:  CLINICAL IMPRESSION: Pt tolerated wall and towel stretch well today to promote erect standing posture.  Fair tolerance to wrist and digit passive stretching.  Reinforcement to limit daily stretching to pain free ranges d/t hx of RA.  Grip strength reassessed today, noting 30 lb increase in  the R hand, and 19 lb increase in the L hand.  Progress towards goals and poc both reviewed today.  Anticipate readiness to d/c next session as pt is showing good adherence to and understanding of HEP, and has been educated on activity modifications to better manage tremors during ADL/IADLs.  Pt in agreement with plan to complete d/c assessment next session.  PERFORMANCE DEFICITS: in functional skills including , cognitive skills including , and psychosocial skills including coping strategies, environmental adaptation, interpersonal interactions, and routines and behaviors.   IMPAIRMENTS: are limiting patient from ADLs, IADLs, and leisure.   CO-MORBIDITIES: may have co-morbidities  that affects occupational performance. Patient will benefit from skilled OT to address above  impairments and improve overall function.  MODIFICATION OR ASSISTANCE TO COMPLETE EVALUATION: Min-Moderate modification of tasks or assist with assess necessary to complete an evaluation.  OT OCCUPATIONAL PROFILE AND HISTORY: Detailed assessment: Review of records and additional review of physical, cognitive, psychosocial history related to current functional performance.  CLINICAL DECISION MAKING: Moderate - several treatment options, min-mod task modification necessary  REHAB POTENTIAL: Good  EVALUATION COMPLEXITY: Moderate    PLAN:  OT FREQUENCY: 2x/week  OT DURATION: 12 weeks  PLANNED INTERVENTIONS: 97535 self care/ADL training, 02889 therapeutic exercise, 97530 therapeutic activity, 97112 neuromuscular re-education, 97140 manual therapy, 97018 paraffin, 02989 moist heat, 97010 cryotherapy, 97034 contrast bath, functional mobility training, energy conservation, coping strategies training, patient/family education, and DME and/or AE instructions  RECOMMENDED OTHER SERVICES: PT, ST  CONSULTED AND AGREED WITH PLAN OF CARE: Patient  PLAN FOR NEXT SESSION: d/c  Inocente Blazing, MS, OTR/L  09/09/2024, 3:35 PM

## 2024-09-12 ENCOUNTER — Ambulatory Visit

## 2024-09-12 ENCOUNTER — Ambulatory Visit: Admitting: Physical Therapy

## 2024-09-12 DIAGNOSIS — R278 Other lack of coordination: Secondary | ICD-10-CM

## 2024-09-12 DIAGNOSIS — R2681 Unsteadiness on feet: Secondary | ICD-10-CM

## 2024-09-12 DIAGNOSIS — M6281 Muscle weakness (generalized): Secondary | ICD-10-CM | POA: Diagnosis not present

## 2024-09-12 DIAGNOSIS — R251 Tremor, unspecified: Secondary | ICD-10-CM

## 2024-09-12 DIAGNOSIS — R262 Difficulty in walking, not elsewhere classified: Secondary | ICD-10-CM

## 2024-09-12 DIAGNOSIS — R2689 Other abnormalities of gait and mobility: Secondary | ICD-10-CM

## 2024-09-12 NOTE — Therapy (Signed)
 OUTPATIENT PHYSICAL THERAPY TREATMENT   Patient Name: Janice Rowe MRN: [id number] DOB:01-14-1956, 68 y.o., female Today's Date: 09/12/2024  PCP: Fernande Ophelia JINNY DOUGLAS, MD REFERRING PROVIDER: Maree Jannett POUR, MD  END OF SESSION:   PT End of Session - 09/12/24 0938     Visit Number 8    Number of Visits 24    Date for Recertification  11/05/24    Authorization Type [employer]    Progress Note Due on Visit 10    PT Start Time 0937    PT Stop Time 1015    PT Time Calculation (min) 38 min    Equipment Utilized During Treatment Gait belt    Activity Tolerance Patient tolerated treatment well;No increased pain;Patient limited by fatigue    Behavior During Therapy [facility] for tasks assessed/performed          Past Medical History:  Diagnosis Date   Acute pancreatitis 11/2008   Anemia    Arthritis    GERD (gastroesophageal reflux disease)    Hiatal hernia    Hypertension    Rheumatoid arthritis (HCC)    Sleep apnea    uses c-pap   Vitamin D  deficiency    Past Surgical History:  Procedure Laterality Date   CARPAL TUNNEL RELEASE Right 2013   COLONOSCOPY  2007   COLONOSCOPY WITH PROPOFOL  N/A 11/29/2016   Procedure: COLONOSCOPY WITH PROPOFOL ;  Surgeon: Reyes LELON Cota, MD;  Location: [facility] ENDOSCOPY;  Service: Endoscopy;  Laterality: N/A;   ESOPHAGOGASTRODUODENOSCOPY (EGD) WITH PROPOFOL  N/A 11/29/2016   Procedure: ESOPHAGOGASTRODUODENOSCOPY (EGD) WITH PROPOFOL ;  Surgeon: Reyes LELON Cota, MD;  Location: [facility] ENDOSCOPY;  Service: Endoscopy;  Laterality: N/A;   IR RADIOLOGIST EVAL & MGMT  05/29/2023   TOTAL HIP ARTHROPLASTY Right 02/18/2021   Procedure: TOTAL HIP ARTHROPLASTY ANTERIOR APPROACH;  Surgeon: Kathlynn Sharper, MD;  Location: [facility] ORS;  Service: Orthopedics;  Laterality: Right;   Patient Active Problem List   Diagnosis Date Noted   S/P hip replacement 02/18/2021   S/P total hip arthroplasty 02/18/2021   Anemia 10/04/2018   Essential hypertension 10/04/2018   Rheumatoid  arthritis with rheumatoid factor (HCC) 10/04/2018   Vitamin D  deficiency 10/04/2018   Lung nodule seen on imaging study 08/21/2017   Benign microscopic hematuria 02/06/2017   Cardiac murmur 11/08/2016   Dysphagia 10/11/2016   Encounter for screening colonoscopy 10/11/2016   Rectal bleeding 10/11/2016   Ocular migraine 08/02/2016   Recurrent major depressive disorder, in full remission 01/27/2016   Obstructive sleep apnea 09/15/2015    ONSET DATE: Dx w/ Parkinsons June 05, 2024  REFERRING DIAG: Tremors, gait changes, stiffness   THERAPY DIAG:  Other lack of coordination  Tremor  Unsteadiness on feet  Muscle weakness (generalized)  Difficulty in walking, not elsewhere classified  Other abnormalities of gait and mobility  Rationale for Evaluation and Treatment: Rehabilitation  SUBJECTIVE:  SUBJECTIVE STATEMENT: Pt reports that she is a little tired this AM. States that power went off around 5:00am causing her Cpap to shut off for a moment. Was unable to got back to sleep.  Pt states that she is going to [employer] 1 time a week; will be going more frequently after d/c from PT, or get a trainer.   Pt accompanied by: self  PERTINENT HISTORY:  Patient reports that she was diagnosed with Parkinsons June 05, 2024. Patient reporting that she enrolled in [facility] ~4 wks ago; reports tremors are predominantly in her hands & R leg. Pt with relevant PMH of RA & hx of compression fractures, so she would like to gain strength, learn how to safely exercise/be more active, improve endurance, & improve posture. Pt reports she also has had a R total hip replacement. Pt reports she sometimes does have trouble with stairs & has to rely on railing for support.   At baseline, pt reports completing STSs  daily & walks 1-2 mi/day. Per neurologist note 08/05/24: Parkinsonism symptoms - Tremors, gait changes, stiffness Parkinson's disease with symptoms including tremors, bradykinesia, rigidity, and postural instability. Symptoms have been worsening, particularly in the right hand, with additional tremors in the lips and legs at rest. There is also a decrease in fine motor skills, balance issues, and handwriting changes. The differential diagnosis includes essential tremor, but the clinical presentation is more consistent with Parkinson's disease. Patient reports good sense of smell. No active dreams. Constipation, patient takes prune juice. Handwriting getting smaller. Patient reports no problems with loud/soft voice. Difficulties with walking. Taking gabapentin , however slowly weaning off. Carbidopa-levodopa has improved tremors overall.   PAIN:  Are you having pain? No  PRECAUTIONS: Fall  WEIGHT BEARING RESTRICTIONS: No  FALLS: Has patient fallen in last 6 months? No; reports stumbles, near falls when pivoting, turning, etc.   LIVING ENVIRONMENT: Lives with: lives alone; son lives next door Lives in: House/apartment Stairs: No Has following equipment at home: Single point cane, Quad cane small base, Walker - 2 wheeled, Environmental consultant - 4 wheeled, Wheelchair (manual), Shower bench, Grab bars, and Ramped entry; pt states that she has sufficient equipment from husband who passed w/ PD  PLOF: Independent  PATIENT GOALS: get stronger, have more endurance, improve posture; learn how to exercise safely  OBJECTIVE:  Note: Objective measures were completed at Evaluation unless otherwise noted.                                                                                               TREATMENT DATE: 09/12/2024   -Kettle bell swings 30 sec each side with 15 sec rest between sides 45 sec rest break between bouts.  -Pickleball sports training for balance training, preparation for social fitness activity  with friends: 15 minutes paddle held in the RUE throughout entire 15 min  -juggling pickle ball in narrow hallway. Cues for improved wrist angle to prevent hitting ball backward. Able to keep up for 2-30 sec at a time.   - battle rope slams and abduction/adduction 30 sec x each with 30-45 sec reach break between  reps and 45 sec between bouts.  - multiple ambulatory transfers to hydration station without UE support. Pt able to carry water in R hand initially, but left hand on subsequent trips to hydration station due to R hand tremor. No LOB noted throughout session    PATIENT EDUCATION: Education details: POC, findings of exam, HEP Person educated: Patient Education method: Explanation Education comprehension: verbalized understanding and needs further education  HOME EXERCISE PROGRAM: Access Code: [email or web address] URL: [email or web address] Date: 08/13/2024 Prepared by: Chiquita Silvan  Exercises - Tandem Walking with Counter Support  - 1 x daily - 7 x weekly - 3 sets - 2-3 reps  GOALS: Goals reviewed with patient? Yes  SHORT TERM GOALS: Target date: 09/24/24  Patient will be independent with home exercise program to improve strength/mobility for increased functional independence with ADLs and mobility. Baseline: establish formal HEP at future visits Goal status: INITIAL  LONG TERM GOALS: Target date: 11/05/24  Patient will improve ABC scale score >80% to demonstrate increased confidence with functional mobility and ADLs.   Baseline: 75.625% Goal status: INITIAL  2.  Patient will improve 30sec STS score to 15x to demonstrate improved functional strength & endurance.  Baseline: 12x Goal status: INITIAL  3.  Patient will increase MiniBest Test score to >18/28 to indicate a reduced risk for falling and demonstrate increased independence with functional mobility and ADLs. Baseline: 23/28 Goal status: INITIAL  4.  Patient will increase six minute walk test distance to  >1026ft for progression to community level ambulation, demonstrating improved gait endurance. Baseline: 08/19/24: 1366ft AMB;  Goal status: INITIAL  ASSESSMENT:  CLINICAL IMPRESSION:  Continued with a diverse variety of activities to stimulate balance control, proprioception, and high velocity and/or high power movements. Pt again looking very much appropriate to begin playing pickleball socially with contacts, no LOB on the day continued demonstration of needed skills, footwork, and reaction timing today. No LOB with high intensity strengthening. Chronic back pain has not yet been a limitation with these activity thus far. Pt reports no exacerbation of pain areas during session, will relay any soreness issues after the fact. Pt states that she is willing to decrease frequency of PT treatment in expectation to d/c for community fitness program within the next few weeks.  The pt will benefit from further skilled PT to improve these deficits in order to increase QOL and ease/safety with ADLs.   OBJECTIVE IMPAIRMENTS: Abnormal gait, decreased activity tolerance, decreased balance, decreased endurance, difficulty walking, and decreased strength.  ACTIVITY LIMITATIONS: carrying, lifting, bending, stairs, and reach over head PARTICIPATION LIMITATIONS: laundry, shopping, community activity, and yard work PERSONAL FACTORS: Age, Sex, and 3+ comorbidities: RA, HTN, hx of compression fractures are also affecting patient's functional outcome.  REHAB POTENTIAL: Good CLINICAL DECISION MAKING: Evolving/moderate complexity EVALUATION COMPLEXITY: Moderate  PLAN:  PT FREQUENCY: 1-2x/week PT DURATION: 12 weeks PLANNED INTERVENTIONS: 97164- PT Re-evaluation, 97750- Physical Performance Testing, 97110-Therapeutic exercises, 97530- Therapeutic activity, [id number]- Neuromuscular re-education, 97535- Self Care, 02859- Manual therapy, [id number]- Gait training, [phone]- Canalith repositioning, [phone] (1-2 muscles), [phone] (3+  muscles)- Dry Needling, Patient/Family education, Balance training, Stair training, Joint mobilization, Joint manipulation, Spinal manipulation, Spinal mobilization, Vestibular training, Cryotherapy, and Moist heat  PLAN FOR NEXT SESSION: Continue discussion about taking a pause in services after 10th visit to allow for period of independent activity.   Massie FORBES Dollar, PT 09/12/2024, 9:39 AM

## 2024-09-17 ENCOUNTER — Ambulatory Visit: Admitting: Physical Therapy

## 2024-09-17 ENCOUNTER — Encounter

## 2024-09-19 ENCOUNTER — Ambulatory Visit

## 2024-09-19 ENCOUNTER — Ambulatory Visit: Admitting: Physical Therapy

## 2024-09-19 DIAGNOSIS — R278 Other lack of coordination: Secondary | ICD-10-CM

## 2024-09-19 DIAGNOSIS — R2689 Other abnormalities of gait and mobility: Secondary | ICD-10-CM

## 2024-09-19 DIAGNOSIS — R262 Difficulty in walking, not elsewhere classified: Secondary | ICD-10-CM

## 2024-09-19 DIAGNOSIS — R2681 Unsteadiness on feet: Secondary | ICD-10-CM

## 2024-09-19 DIAGNOSIS — M6281 Muscle weakness (generalized): Secondary | ICD-10-CM | POA: Diagnosis not present

## 2024-09-19 DIAGNOSIS — M5459 Other low back pain: Secondary | ICD-10-CM

## 2024-09-19 DIAGNOSIS — R251 Tremor, unspecified: Secondary | ICD-10-CM

## 2024-09-19 NOTE — Therapy (Signed)
 OUTPATIENT PHYSICAL THERAPY TREATMENT   Patient Name: Janice Rowe MRN: [id number] DOB:04-18-1956, 68 y.o., female Today's Date: 09/19/2024  PCP: Fernande Ophelia JINNY DOUGLAS, MD REFERRING PROVIDER: Maree Jannett POUR, MD  END OF SESSION:   PT End of Session - 09/19/24 0936     Visit Number 9    Number of Visits 24    Date for Recertification  11/05/24    Authorization Type [employer]    Progress Note Due on Visit 10    PT Start Time 0935    PT Stop Time 1015    PT Time Calculation (min) 40 min    Equipment Utilized During Treatment Gait belt    Activity Tolerance Patient tolerated treatment well;No increased pain;Patient limited by fatigue    Behavior During Therapy [facility] for tasks assessed/performed          Past Medical History:  Diagnosis Date   Acute pancreatitis 11/2008   Anemia    Arthritis    GERD (gastroesophageal reflux disease)    Hiatal hernia    Hypertension    Rheumatoid arthritis (HCC)    Sleep apnea    uses c-pap   Vitamin D  deficiency    Past Surgical History:  Procedure Laterality Date   CARPAL TUNNEL RELEASE Right 2013   COLONOSCOPY  2007   COLONOSCOPY WITH PROPOFOL  N/A 11/29/2016   Procedure: COLONOSCOPY WITH PROPOFOL ;  Surgeon: Reyes LELON Cota, MD;  Location: [facility] ENDOSCOPY;  Service: Endoscopy;  Laterality: N/A;   ESOPHAGOGASTRODUODENOSCOPY (EGD) WITH PROPOFOL  N/A 11/29/2016   Procedure: ESOPHAGOGASTRODUODENOSCOPY (EGD) WITH PROPOFOL ;  Surgeon: Reyes LELON Cota, MD;  Location: [facility] ENDOSCOPY;  Service: Endoscopy;  Laterality: N/A;   IR RADIOLOGIST EVAL & MGMT  05/29/2023   TOTAL HIP ARTHROPLASTY Right 02/18/2021   Procedure: TOTAL HIP ARTHROPLASTY ANTERIOR APPROACH;  Surgeon: Kathlynn Sharper, MD;  Location: [facility] ORS;  Service: Orthopedics;  Laterality: Right;   Patient Active Problem List   Diagnosis Date Noted   S/P hip replacement 02/18/2021   S/P total hip arthroplasty 02/18/2021   Anemia 10/04/2018   Essential hypertension 10/04/2018   Rheumatoid  arthritis with rheumatoid factor (HCC) 10/04/2018   Vitamin D  deficiency 10/04/2018   Lung nodule seen on imaging study 08/21/2017   Benign microscopic hematuria 02/06/2017   Cardiac murmur 11/08/2016   Dysphagia 10/11/2016   Encounter for screening colonoscopy 10/11/2016   Rectal bleeding 10/11/2016   Ocular migraine 08/02/2016   Recurrent major depressive disorder, in full remission 01/27/2016   Obstructive sleep apnea 09/15/2015    ONSET DATE: Dx w/ Parkinsons June 05, 2024  REFERRING DIAG: Tremors, gait changes, stiffness   THERAPY DIAG:  Other lack of coordination  Tremor  Unsteadiness on feet  Muscle weakness (generalized)  Difficulty in walking, not elsewhere classified  Other abnormalities of gait and mobility  Other low back pain  Rationale for Evaluation and Treatment: Rehabilitation  SUBJECTIVE:  SUBJECTIVE STATEMENT:   Pt reports that she is doing okay. Would like to know what are some good strengthening exercises with free weights.  Pt states that she is going to [employer] 1 time a week; will be going more frequently after d/c from PT, or get a trainer.   Pt accompanied by: self  PERTINENT HISTORY:  Patient reports that she was diagnosed with Parkinsons June 05, 2024. Patient reporting that she enrolled in [facility] ~4 wks ago; reports tremors are predominantly in her hands & R leg. Pt with relevant PMH of RA & hx of compression fractures, so she would like to gain strength, learn how to safely exercise/be more active, improve endurance, & improve posture. Pt reports she also has had a R total hip replacement. Pt reports she sometimes does have trouble with stairs & has to rely on railing for support.   At baseline, pt reports completing STSs daily & walks 1-2  mi/day. Per neurologist note 08/05/24: Parkinsonism symptoms - Tremors, gait changes, stiffness Parkinson's disease with symptoms including tremors, bradykinesia, rigidity, and postural instability. Symptoms have been worsening, particularly in the right hand, with additional tremors in the lips and legs at rest. There is also a decrease in fine motor skills, balance issues, and handwriting changes. The differential diagnosis includes essential tremor, but the clinical presentation is more consistent with Parkinson's disease. Patient reports good sense of smell. No active dreams. Constipation, patient takes prune juice. Handwriting getting smaller. Patient reports no problems with loud/soft voice. Difficulties with walking. Taking gabapentin , however slowly weaning off. Carbidopa-levodopa has improved tremors overall.   PAIN:  Are you having pain? No  PRECAUTIONS: Fall  WEIGHT BEARING RESTRICTIONS: No  FALLS: Has patient fallen in last 6 months? No; reports stumbles, near falls when pivoting, turning, etc.   LIVING ENVIRONMENT: Lives with: lives alone; son lives next door Lives in: House/apartment Stairs: No Has following equipment at home: Single point cane, Quad cane small base, Walker - 2 wheeled, Environmental consultant - 4 wheeled, Wheelchair (manual), Shower bench, Grab bars, and Ramped entry; pt states that she has sufficient equipment from husband who passed w/ PD  PLOF: Independent  PATIENT GOALS: get stronger, have more endurance, improve posture; learn how to exercise safely  OBJECTIVE:  Note: Objective measures were completed at Evaluation unless otherwise noted.                                                                                               TREATMENT DATE: 09/19/2024  Nustep  Level 4-10 x 8 min rolling hill program for improved reciprocal trunkal activation and BLE/BUE activity tolerance.   Transported to Well zone for education on use of free weights.  - Standing Bent Over  Single Arm Scapular Row with Table Support and 15# kettle bell.   - 15 reps - Seated Punches with Trunk Rotation and 5# DB  -30 reps - Standing Shoulder Horizontal Abduction with Anchored Resistance  Yellow tube - 15 reps - Mini Lunge with 5# DB  10 reps - Lateral Lunge  with 5# DB  - 10 reps - Kettlebell  diagonal swing 15# - 10 reps - Kettlebell Swing x 5reps  15#   - battle rope slam  2 x 30 sec Farmer carry 5# BDs x 127ft.  No LOB noted throughout session    PATIENT EDUCATION: Education details: POC, findings of exam, HEP Person educated: Patient Education method: Explanation Education comprehension: verbalized understanding and needs further education  HOME EXERCISE PROGRAM: Access Code: [email or web address] URL: [email or web address] Date: 09/19/2024 Prepared by: Massie Dollar  Exercises - Tandem Walking with Counter Support  - 1 x daily - 7 x weekly - 3 sets - 2-3 reps - Standing Bent Over Single Arm Scapular Row with Table Support  - 1 x daily - 5 x weekly - 3 sets - 10 reps - Seated Punches with Trunk Rotation  - 1 x daily - 7 x weekly - 3 sets - 10 reps - Standing Shoulder Horizontal Abduction with Anchored Resistance  - 1 x daily - 7 x weekly - 3 sets - 10 reps - Mini Lunge  - 1 x daily - 7 x weekly - 3 sets - 10 reps - Lateral Lunge  - 1 x daily - 7 x weekly - 3 sets - 10 reps - Kettlebell Squat  - 1 x daily - 7 x weekly - 3 sets - 10 reps - Kettlebell Swing  - 1 x daily - 7 x weekly - 3 sets - 10 reps  GOALS: Goals reviewed with patient? Yes  SHORT TERM GOALS: Target date: 09/24/24  Patient will be independent with home exercise program to improve strength/mobility for increased functional independence with ADLs and mobility. Baseline: establish formal HEP at future visits Goal status: INITIAL  LONG TERM GOALS: Target date: 11/05/24  Patient will improve ABC scale score >80% to demonstrate increased confidence with functional mobility and ADLs.   Baseline:  75.625% Goal status: INITIAL  2.  Patient will improve 30sec STS score to 15x to demonstrate improved functional strength & endurance.  Baseline: 12x Goal status: INITIAL  3.  Patient will increase MiniBest Test score to >18/28 to indicate a reduced risk for falling and demonstrate increased independence with functional mobility and ADLs. Baseline: 23/28 Goal status: INITIAL  4.  Patient will increase six minute walk test distance to >1027ft for progression to community level ambulation, demonstrating improved gait endurance. Baseline: 08/19/24: 1311ft AMB;  Goal status: INITIAL  ASSESSMENT:  CLINICAL IMPRESSION:  Continued with a diverse variety of activities to stimulate balance control, proprioception, and high velocity and/or high power movements. HEP expanded for improved use of community fitness equipment. No LOB with high intensity strengthening. Chronic back pain has not yet been a limitation with these activity thus far. Pt reports no exacerbation of pain areas during session, will relay any soreness issues after the fact. Pt states that she is willing to decrease frequency of PT treatment in expectation to d/c for community fitness program next week.  The pt will benefit from further skilled PT to improve these deficits in order to increase QOL and ease/safety with ADLs.   OBJECTIVE IMPAIRMENTS: Abnormal gait, decreased activity tolerance, decreased balance, decreased endurance, difficulty walking, and decreased strength.  ACTIVITY LIMITATIONS: carrying, lifting, bending, stairs, and reach over head PARTICIPATION LIMITATIONS: laundry, shopping, community activity, and yard work PERSONAL FACTORS: Age, Sex, and 3+ comorbidities: RA, HTN, hx of compression fractures are also affecting patient's functional outcome.  REHAB POTENTIAL: Good CLINICAL DECISION MAKING: Evolving/moderate complexity EVALUATION COMPLEXITY: Moderate  PLAN:  PT FREQUENCY: 1-2x/week PT DURATION: 12  weeks  PLANNED INTERVENTIONS: 97164- PT Re-evaluation, 97750- Physical Performance Testing, 97110-Therapeutic exercises, 97530- Therapeutic activity, [id number]- Neuromuscular re-education, 97535- Self Care, 02859- Manual therapy, [phone]- Gait training, [phone]- Canalith repositioning, [phone] (1-2 muscles), [phone] (3+ muscles)- Dry Needling, Patient/Family education, Balance training, Stair training, Joint mobilization, Joint manipulation, Spinal manipulation, Spinal mobilization, Vestibular training, Cryotherapy, and Moist heat  PLAN FOR NEXT SESSION:  D/c from PT. Answer questions on HEP advancement.  Massie FORBES Dollar, PT 09/19/2024, 9:39 AM

## 2024-09-23 ENCOUNTER — Ambulatory Visit: Admitting: Physical Therapy

## 2024-09-23 DIAGNOSIS — R262 Difficulty in walking, not elsewhere classified: Secondary | ICD-10-CM

## 2024-09-23 DIAGNOSIS — R251 Tremor, unspecified: Secondary | ICD-10-CM

## 2024-09-23 DIAGNOSIS — M6281 Muscle weakness (generalized): Secondary | ICD-10-CM | POA: Diagnosis not present

## 2024-09-23 DIAGNOSIS — R278 Other lack of coordination: Secondary | ICD-10-CM

## 2024-09-23 DIAGNOSIS — R2689 Other abnormalities of gait and mobility: Secondary | ICD-10-CM

## 2024-09-23 DIAGNOSIS — R2681 Unsteadiness on feet: Secondary | ICD-10-CM

## 2024-09-23 NOTE — Therapy (Signed)
 OUTPATIENT PHYSICAL THERAPY DISCHARGE   Patient Name: Janice Rowe MRN: [id number] DOB:[birth year], 68 y.o., female Today's Date: 09/23/2024  PCP: Janice Ophelia JINNY DOUGLAS, MD REFERRING PROVIDER: Maree Jannett POUR, MD  END OF SESSION:   PT End of Session - 09/23/24 0805     Visit Number 10    Number of Visits 24    Date for Recertification  11/05/24    Authorization Type [employer]    Progress Note Due on Visit 10    PT Start Time 0804    PT Stop Time 0847    PT Time Calculation (min) 43 min    Equipment Utilized During Treatment Gait belt    Activity Tolerance Patient tolerated treatment well;No increased pain;Patient limited by fatigue    Behavior During Therapy [facility] for tasks assessed/performed           Past Medical History:  Diagnosis Date   Acute pancreatitis 11/2008   Anemia    Arthritis    GERD (gastroesophageal reflux disease)    Hiatal hernia    Hypertension    Rheumatoid arthritis (HCC)    Sleep apnea    uses c-pap   Vitamin D  deficiency    Past Surgical History:  Procedure Laterality Date   CARPAL TUNNEL RELEASE Right 2013   COLONOSCOPY  2007   COLONOSCOPY WITH PROPOFOL  N/A 11/29/2016   Procedure: COLONOSCOPY WITH PROPOFOL ;  Surgeon: Reyes LELON Cota, MD;  Location: [facility] ENDOSCOPY;  Service: Endoscopy;  Laterality: N/A;   ESOPHAGOGASTRODUODENOSCOPY (EGD) WITH PROPOFOL  N/A 11/29/2016   Procedure: ESOPHAGOGASTRODUODENOSCOPY (EGD) WITH PROPOFOL ;  Surgeon: Reyes LELON Cota, MD;  Location: [facility] ENDOSCOPY;  Service: Endoscopy;  Laterality: N/A;   IR RADIOLOGIST EVAL & MGMT  05/29/2023   TOTAL HIP ARTHROPLASTY Right 02/18/2021   Procedure: TOTAL HIP ARTHROPLASTY ANTERIOR APPROACH;  Surgeon: Kathlynn Sharper, MD;  Location: [facility] ORS;  Service: Orthopedics;  Laterality: Right;   Patient Active Problem List   Diagnosis Date Noted   S/P hip replacement 02/18/2021   S/P total hip arthroplasty 02/18/2021   Anemia 10/04/2018   Essential hypertension 10/04/2018    Rheumatoid arthritis with rheumatoid factor (HCC) 10/04/2018   Vitamin D  deficiency 10/04/2018   Lung nodule seen on imaging study 08/21/2017   Benign microscopic hematuria 02/06/2017   Cardiac murmur 11/08/2016   Dysphagia 10/11/2016   Encounter for screening colonoscopy 10/11/2016   Rectal bleeding 10/11/2016   Ocular migraine 08/02/2016   Recurrent major depressive disorder, in full remission 01/27/2016   Obstructive sleep apnea 09/15/2015    ONSET DATE: Dx w/ Parkinsons June 05, 2024  REFERRING DIAG: Tremors, gait changes, stiffness   THERAPY DIAG:  Other lack of coordination  Tremor  Unsteadiness on feet  Muscle weakness (generalized)  Difficulty in walking, not elsewhere classified  Other abnormalities of gait and mobility  Rationale for Evaluation and Treatment: Rehabilitation  SUBJECTIVE:  SUBJECTIVE STATEMENT:  Pt reporting that she is doing okay today. Pt prepared for d/c. Signed up for silver sneakers at the [facility]; plan to continue rock stedy.    Pt accompanied by: self  PERTINENT HISTORY:  Patient reports that she was diagnosed with Parkinsons June 05, 2024. Patient reporting that she enrolled in [facility] ~4 wks ago; reports tremors are predominantly in her hands & R leg. Pt with relevant PMH of RA & hx of compression fractures, so she would like to gain strength, learn how to safely exercise/be more active, improve endurance, & improve posture. Pt reports she also has had a R total hip replacement. Pt reports she sometimes does have trouble with stairs & has to rely on railing for support.   At baseline, pt reports completing STSs daily & walks 1-2 mi/day. Per neurologist note 08/05/24: Parkinsonism symptoms - Tremors, gait changes, stiffness Parkinson's disease with  symptoms including tremors, bradykinesia, rigidity, and postural instability. Symptoms have been worsening, particularly in the right hand, with additional tremors in the lips and legs at rest. There is also a decrease in fine motor skills, balance issues, and handwriting changes. The differential diagnosis includes essential tremor, but the clinical presentation is more consistent with Parkinson's disease. Patient reports good sense of smell. No active dreams. Constipation, patient takes prune juice. Handwriting getting smaller. Patient reports no problems with loud/soft voice. Difficulties with walking. Taking gabapentin , however slowly weaning off. Carbidopa-levodopa has improved tremors overall.   PAIN:  Are you having pain? No  PRECAUTIONS: Fall  WEIGHT BEARING RESTRICTIONS: No  FALLS: Has patient fallen in last 6 months? No; reports stumbles, near falls when pivoting, turning, etc.   LIVING ENVIRONMENT: Lives with: lives alone; son lives next door Lives in: House/apartment Stairs: No Has following equipment at home: Single point cane, Quad cane small base, Walker - 2 wheeled, Environmental Consultant - 4 wheeled, Wheelchair (manual), Shower bench, Grab bars, and Ramped entry; pt states that she has sufficient equipment from husband who passed w/ PD  PLOF: Independent  PATIENT GOALS: get stronger, have more endurance, improve posture; learn how to exercise safely  OBJECTIVE:  Note: Objective measures were completed at Evaluation unless otherwise noted.                                                                                               TREATMENT DATE: 09/23/2024  The Activities-Specific Balance Confidence (ABC) Scale 0% 10 20 30  40 50 60 70 80 90 100% No confidence<->completely confident  "How confident are you that you will not lose your balance or become unsteady when you . . .   Date tested 09/23/24  Walk around the house 100%  2. Walk up or down stairs 90%  3. Bend over and pick up  a slipper from in front of a closet floor 80%  4. Reach for a small can off a shelf at eye level 100%  5. Stand on tip toes and reach for something above your head 90%  6. Stand on a chair and reach for something 10%  7. Sweep the floor 80%  8.  Walk outside the house to a car parked in the driveway 90%  9. Get into or out of a car 90%  10. Walk across a parking lot to the mall 100%  11. Walk up or down a ramp 90%  12. Walk in a crowded mall where people rapidly walk past you 80%  13. Are bumped into by people as you walk through the mall 80%  14. Step onto or off of an escalator while you are holding onto the railing 90%  15. Step onto or off an escalator while holding onto parcels such that you cannot hold onto the railing 90%  16. Walk outside on icy sidewalks 10%  Total: #/16 79.4%   6 Min Walk Test:  Instructed patient to ambulate as quickly and as safely as possible for 6 minutes using LRAD. Patient was allowed to take standing rest breaks without stopping the test, but if the patient required a sitting rest break the clock would be stopped and the test would be over.  Results: 1643 feet (500.8 meters, Avg speed 1.4 m/s) using no AD with SBA. Results indicate that the patient has reduced endurance with ambulation compared to age matched norms.  Age Matched Norms: 73-69 yo M: 73 F: 20, 20-79 yo M: 24 F: 471, 6-89 yo M: 417 F: 392 MDC: 58.21 meters (190.98 feet) or 50 meters (ANPTA Core Set of Outcome Measures for Adults with Neurologic Conditions, 2018)   30secSTS: pt able to complete 15 repetitions within 30 sec without use of UEs for support    Pt stating that she feels like she has a good repertoire of exercises for strengthening as part of HEP. Pt wondering if there are any activities/exercises that she would benefit from that are more focused on either balance or posture.    Completed 1x30 sec each LE leading standing tandem balance with hands hovering at support bar. Pt  with some mild instability, able to utilize ankle strategies for maintaining upright posture.   Also completed 1x30 sec bout of standing balance, normal BOS, with eyes closed with hands hovering at support bar. Pt with some mild instability initially that improved by end of bout.   Also completed standing march step taps at aerobic step with alternating UE up to promote SLS & large, reciprocal movement; pt educated that she could also complete with standing march without use of step. Educated to complete near wall for safety.   Pt also trialed standing rows with TB in doorframe for improved posture.  1x15 with RTB; pt reporting minimal challenge with activity.  1x10 with GTB; pt reporting increased challenge compared to RTB.  Pt also provided BTB for eventual progression as part of HEP.   Pt reporting feeling satisfied with HEP, including new exercises this date, & overall progress with PT.     PATIENT EDUCATION: Education details: POC, findings of exam, HEP Person educated: Patient Education method: Explanation Education comprehension: verbalized understanding and needs further education  HOME EXERCISE PROGRAM: Access Code: [email or web address] URL: [email or web address] Date: 09/23/2024 Prepared by: Connell Kiss  Exercises - Tandem Walking with Counter Support - 1 x daily - 7 x weekly - 3 sets - 2-3 reps - Standing Bent Over Single Arm Scapular Row with Table Support - 1 x daily - 5 x weekly - 3 sets - 10 reps - Seated Punches with Trunk Rotation - 1 x daily - 7 x weekly - 3 sets - 10 reps - Standing Shoulder Horizontal Abduction with Anchored Resistance -  1 x daily - 7 x weekly - 3 sets - 10 reps - Mini Lunge - 1 x daily - 7 x weekly - 3 sets - 10 reps - Lateral Lunge - 1 x daily - 7 x weekly - 3 sets - 10 reps - Kettlebell Squat - 1 x daily - 7 x weekly - 3 sets - 10 reps - Kettlebell Swing - 1 x daily - 7 x weekly - 3 sets - 10 reps - Standing Tandem Balance with Counter  Support - 1 x daily - 7 x weekly - 3 sets - 10 reps - Standing Balance with Eyes Closed - 1 x daily - 7 x weekly - 3 sets - 10 reps - Standing Shoulder Row with Anchored Resistance - 1 x daily - 7 x weekly - 3 sets - 10 reps - Standing March with Alternating Med [facility] - 1 x daily - 7 x weekly - 3 sets - 10-15 reps   GOALS: Goals reviewed with patient? Yes  SHORT TERM GOALS: Target date: 09/24/24  Patient will be independent with home exercise program to improve strength/mobility for increased functional independence with ADLs and mobility. Baseline: establish formal HEP at future visits 09/23/24: pt reporting compliance, satisfaction with HEP Goal status: MET  LONG TERM GOALS: Target date: 11/05/24  Patient will improve ABC scale score >80% to demonstrate increased confidence with functional mobility and ADLs.   Baseline: 75.625% 09/23/24: 79.4%  Goal status: NOT MET  2.  Patient will improve 30sec STS score to 15x to demonstrate improved functional strength & endurance.  Baseline: 12x 09/23/24: 15x  Goall status: MET  3.  Patient will increase MiniBest Test score to >18/28 to indicate a reduced risk for falling and demonstrate increased independence with functional mobility and ADLs. Baseline: 23/28 Goal status: MET  4.  Patient will increase six minute walk test distance to >106ft for progression to community level ambulation, demonstrating improved gait endurance. Baseline: 08/19/24: 1364ft AMB; 1643 ft Goal status: MET  ASSESSMENT:  CLINICAL IMPRESSION:  Today's session focused on completing physical performance measures for discharge. Pt meeting goals for both & 30secSTS, showing improvement from initial evaluation. Pt with verbal reports of improved confidence with balance with some objective improvement in ABC. Pt requesting additional exercises/activities for balance & posture. Added tandem stance, standing with eyes closed, standing march with alternating UE  up, & standing rows with TB resistance to HEP to address pt concerns. Pt reporting satisfaction with results from PT, as well as with overall HEP program. Pt reporting joining silver sneakers at [facility], continuing rock stedy boxing for increased activity.    OBJECTIVE IMPAIRMENTS: Abnormal gait, decreased activity tolerance, decreased balance, decreased endurance, difficulty walking, and decreased strength.  ACTIVITY LIMITATIONS: carrying, lifting, bending, stairs, and reach over head PARTICIPATION LIMITATIONS: laundry, shopping, community activity, and yard work PERSONAL FACTORS: Age, Sex, and 3+ comorbidities: RA, HTN, hx of compression fractures are also affecting patient's functional outcome.  REHAB POTENTIAL: Good CLINICAL DECISION MAKING: Evolving/moderate complexity EVALUATION COMPLEXITY: Moderate  PLAN:  PT FREQUENCY: 1-2x/week PT DURATION: 12 weeks PLANNED INTERVENTIONS: 97164- PT Re-evaluation, 97750- Physical Performance Testing, 97110-Therapeutic exercises, 97530- Therapeutic activity, [id number]- Neuromuscular re-education, 97535- Self Care, [id number]- Manual therapy, [id number]- Gait training, [phone]- Canalith repositioning, [phone] (1-2 muscles), [id number] (3+ muscles)- Dry Needling, Patient/Family education, Balance training, Stair training, Joint mobilization, Joint manipulation, Spinal manipulation, Spinal mobilization, Vestibular training, Cryotherapy, and Moist heat  PLAN FOR NEXT SESSION:    Chiquita Silvan, Student-PT 09/23/2024, 9:00  AM

## 2024-09-30 ENCOUNTER — Ambulatory Visit: Admitting: Physical Therapy

## 2024-10-02 ENCOUNTER — Ambulatory Visit: Admitting: Physical Therapy

## 2025-06-22 ENCOUNTER — Encounter: Admitting: Dermatology
